# Patient Record
Sex: Female | Born: 1955
Health system: Southern US, Community
[De-identification: ages and names within clinical notes are randomized; demographics above are authoritative.]

## PROBLEM LIST (undated history)

## (undated) DIAGNOSIS — F329 Major depressive disorder, single episode, unspecified: Secondary | ICD-10-CM

## (undated) DIAGNOSIS — E669 Obesity, unspecified: Secondary | ICD-10-CM

## (undated) DIAGNOSIS — E785 Hyperlipidemia, unspecified: Secondary | ICD-10-CM

## (undated) DIAGNOSIS — R519 Headache, unspecified: Secondary | ICD-10-CM

## (undated) DIAGNOSIS — Z9889 Other specified postprocedural states: Secondary | ICD-10-CM

## (undated) DIAGNOSIS — I10 Essential (primary) hypertension: Secondary | ICD-10-CM

## (undated) DIAGNOSIS — T7840XA Allergy, unspecified, initial encounter: Secondary | ICD-10-CM

## (undated) DIAGNOSIS — F32A Depression, unspecified: Secondary | ICD-10-CM

## (undated) DIAGNOSIS — R112 Nausea with vomiting, unspecified: Secondary | ICD-10-CM

## (undated) DIAGNOSIS — F419 Anxiety disorder, unspecified: Secondary | ICD-10-CM

## (undated) HISTORY — PX: OTHER SURGICAL HISTORY: SHX169

## (undated) HISTORY — PX: ABDOMINAL HYSTERECTOMY: SHX81

## (undated) HISTORY — DX: Obesity, unspecified: E66.9

## (undated) HISTORY — DX: Allergy, unspecified, initial encounter: T78.40XA

## (undated) HISTORY — DX: Hyperlipidemia, unspecified: E78.5

## (undated) HISTORY — DX: Essential (primary) hypertension: I10

## (undated) HISTORY — DX: Anxiety disorder, unspecified: F41.9

## (undated) HISTORY — PX: CHOLECYSTECTOMY: SHX55

## (undated) HISTORY — DX: Major depressive disorder, single episode, unspecified: F32.9

## (undated) HISTORY — DX: Depression, unspecified: F32.A

---

## 1997-11-15 ENCOUNTER — Ambulatory Visit (HOSPITAL_COMMUNITY): Admission: RE | Admit: 1997-11-15 | Discharge: 1997-11-15 | Payer: Self-pay | Admitting: Family Medicine

## 1999-01-30 ENCOUNTER — Ambulatory Visit (HOSPITAL_COMMUNITY): Admission: RE | Admit: 1999-01-30 | Discharge: 1999-01-30 | Payer: Self-pay | Admitting: *Deleted

## 1999-01-30 ENCOUNTER — Encounter: Payer: Self-pay | Admitting: *Deleted

## 2000-06-16 ENCOUNTER — Encounter: Payer: Self-pay | Admitting: *Deleted

## 2000-06-16 ENCOUNTER — Ambulatory Visit (HOSPITAL_COMMUNITY): Admission: RE | Admit: 2000-06-16 | Discharge: 2000-06-16 | Payer: Self-pay | Admitting: *Deleted

## 2001-06-02 ENCOUNTER — Emergency Department (HOSPITAL_COMMUNITY): Admission: EM | Admit: 2001-06-02 | Discharge: 2001-06-02 | Payer: Self-pay | Admitting: *Deleted

## 2001-06-09 ENCOUNTER — Encounter: Payer: Self-pay | Admitting: Family Medicine

## 2001-06-09 ENCOUNTER — Encounter: Admission: RE | Admit: 2001-06-09 | Discharge: 2001-06-09 | Payer: Self-pay | Admitting: Family Medicine

## 2001-10-20 ENCOUNTER — Encounter: Payer: Self-pay | Admitting: Family Medicine

## 2001-10-20 ENCOUNTER — Ambulatory Visit (HOSPITAL_COMMUNITY): Admission: RE | Admit: 2001-10-20 | Discharge: 2001-10-20 | Payer: Self-pay | Admitting: Family Medicine

## 2001-11-29 ENCOUNTER — Encounter: Payer: Self-pay | Admitting: Emergency Medicine

## 2001-11-29 ENCOUNTER — Emergency Department (HOSPITAL_COMMUNITY): Admission: EM | Admit: 2001-11-29 | Discharge: 2001-11-29 | Payer: Self-pay | Admitting: *Deleted

## 2001-11-30 ENCOUNTER — Emergency Department (HOSPITAL_COMMUNITY): Admission: EM | Admit: 2001-11-30 | Discharge: 2001-11-30 | Payer: Self-pay | Admitting: *Deleted

## 2002-03-08 ENCOUNTER — Encounter: Payer: Self-pay | Admitting: *Deleted

## 2002-03-08 ENCOUNTER — Ambulatory Visit (HOSPITAL_COMMUNITY): Admission: RE | Admit: 2002-03-08 | Discharge: 2002-03-08 | Payer: Self-pay | Admitting: *Deleted

## 2002-07-06 ENCOUNTER — Encounter: Payer: Self-pay | Admitting: Emergency Medicine

## 2002-07-06 ENCOUNTER — Emergency Department (HOSPITAL_COMMUNITY): Admission: EM | Admit: 2002-07-06 | Discharge: 2002-07-06 | Payer: Self-pay | Admitting: Emergency Medicine

## 2002-11-14 ENCOUNTER — Other Ambulatory Visit: Admission: RE | Admit: 2002-11-14 | Discharge: 2002-11-14 | Payer: Self-pay | Admitting: Obstetrics and Gynecology

## 2003-05-23 ENCOUNTER — Encounter: Payer: Self-pay | Admitting: Obstetrics and Gynecology

## 2003-05-23 ENCOUNTER — Ambulatory Visit (HOSPITAL_COMMUNITY): Admission: RE | Admit: 2003-05-23 | Discharge: 2003-05-23 | Payer: Self-pay | Admitting: Obstetrics and Gynecology

## 2003-12-05 ENCOUNTER — Other Ambulatory Visit: Admission: RE | Admit: 2003-12-05 | Discharge: 2003-12-05 | Payer: Self-pay | Admitting: Obstetrics and Gynecology

## 2004-02-19 ENCOUNTER — Encounter: Admission: RE | Admit: 2004-02-19 | Discharge: 2004-02-19 | Payer: Self-pay | Admitting: Family Medicine

## 2004-06-11 ENCOUNTER — Ambulatory Visit (HOSPITAL_COMMUNITY): Admission: RE | Admit: 2004-06-11 | Discharge: 2004-06-11 | Payer: Self-pay | Admitting: Obstetrics and Gynecology

## 2004-10-02 ENCOUNTER — Ambulatory Visit: Payer: Self-pay | Admitting: Family Medicine

## 2004-12-10 ENCOUNTER — Other Ambulatory Visit: Admission: RE | Admit: 2004-12-10 | Discharge: 2004-12-10 | Payer: Self-pay | Admitting: Obstetrics and Gynecology

## 2004-12-30 ENCOUNTER — Emergency Department (HOSPITAL_COMMUNITY): Admission: EM | Admit: 2004-12-30 | Discharge: 2004-12-30 | Payer: Self-pay | Admitting: Emergency Medicine

## 2004-12-31 ENCOUNTER — Ambulatory Visit: Payer: Self-pay | Admitting: Family Medicine

## 2005-01-02 ENCOUNTER — Ambulatory Visit: Payer: Self-pay | Admitting: Licensed Clinical Social Worker

## 2005-01-08 ENCOUNTER — Ambulatory Visit: Payer: Self-pay | Admitting: Family Medicine

## 2005-01-15 ENCOUNTER — Ambulatory Visit: Payer: Self-pay | Admitting: Family Medicine

## 2005-04-28 ENCOUNTER — Ambulatory Visit: Payer: Self-pay | Admitting: Licensed Clinical Social Worker

## 2005-08-21 ENCOUNTER — Ambulatory Visit (HOSPITAL_COMMUNITY): Admission: RE | Admit: 2005-08-21 | Discharge: 2005-08-21 | Payer: Self-pay | Admitting: Obstetrics and Gynecology

## 2005-10-16 ENCOUNTER — Ambulatory Visit: Payer: Self-pay | Admitting: Family Medicine

## 2005-10-17 ENCOUNTER — Encounter: Admission: RE | Admit: 2005-10-17 | Discharge: 2005-10-17 | Payer: Self-pay | Admitting: Family Medicine

## 2005-10-17 ENCOUNTER — Ambulatory Visit: Payer: Self-pay | Admitting: Family Medicine

## 2005-10-22 ENCOUNTER — Ambulatory Visit (HOSPITAL_COMMUNITY): Admission: RE | Admit: 2005-10-22 | Discharge: 2005-10-22 | Payer: Self-pay | Admitting: Family Medicine

## 2005-11-07 ENCOUNTER — Ambulatory Visit: Payer: Self-pay | Admitting: Internal Medicine

## 2006-01-02 ENCOUNTER — Encounter (INDEPENDENT_AMBULATORY_CARE_PROVIDER_SITE_OTHER): Payer: Self-pay | Admitting: Specialist

## 2006-01-02 ENCOUNTER — Ambulatory Visit (HOSPITAL_COMMUNITY): Admission: RE | Admit: 2006-01-02 | Discharge: 2006-01-02 | Payer: Self-pay | Admitting: General Surgery

## 2006-01-13 ENCOUNTER — Ambulatory Visit: Payer: Self-pay | Admitting: Family Medicine

## 2006-01-19 ENCOUNTER — Ambulatory Visit: Payer: Self-pay | Admitting: Family Medicine

## 2006-07-01 ENCOUNTER — Ambulatory Visit: Payer: Self-pay | Admitting: Family Medicine

## 2006-09-02 ENCOUNTER — Ambulatory Visit (HOSPITAL_COMMUNITY): Admission: RE | Admit: 2006-09-02 | Discharge: 2006-09-02 | Payer: Self-pay | Admitting: Obstetrics and Gynecology

## 2006-09-24 ENCOUNTER — Ambulatory Visit: Payer: Self-pay | Admitting: Family Medicine

## 2007-01-06 ENCOUNTER — Ambulatory Visit: Payer: Self-pay | Admitting: Family Medicine

## 2007-01-06 LAB — CONVERTED CEMR LAB
Basophils Relative: 0.6 % (ref 0.0–1.0)
Bilirubin, Direct: 0.1 mg/dL (ref 0.0–0.3)
Calcium: 9.7 mg/dL (ref 8.4–10.5)
Chloride: 104 meq/L (ref 96–112)
Cholesterol: 182 mg/dL (ref 0–200)
Creatinine, Ser: 0.6 mg/dL (ref 0.4–1.2)
Direct LDL: 106.5 mg/dL
Eosinophils Relative: 3.8 % (ref 0.0–5.0)
GFR calc non Af Amer: 112 mL/min
Glucose, Bld: 91 mg/dL (ref 70–99)
HCT: 43.1 % (ref 36.0–46.0)
Lymphocytes Relative: 32.8 % (ref 12.0–46.0)
MCHC: 33.8 g/dL (ref 30.0–36.0)
MCV: 93.5 fL (ref 78.0–100.0)
Monocytes Absolute: 0.7 10*3/uL (ref 0.2–0.7)
Monocytes Relative: 7.7 % (ref 3.0–11.0)
Neutrophils Relative %: 55.1 % (ref 43.0–77.0)
Platelets: 267 10*3/uL (ref 150–400)
Sodium: 141 meq/L (ref 135–145)
TSH: 2.2 microintl units/mL (ref 0.35–5.50)
Total Bilirubin: 1 mg/dL (ref 0.3–1.2)
Total Protein: 7.2 g/dL (ref 6.0–8.3)
Triglycerides: 223 mg/dL (ref 0–149)
VLDL: 45 mg/dL — ABNORMAL HIGH (ref 0–40)
WBC: 8.8 10*3/uL (ref 4.5–10.5)

## 2007-01-08 DIAGNOSIS — F32A Depression, unspecified: Secondary | ICD-10-CM | POA: Insufficient documentation

## 2007-01-08 DIAGNOSIS — J309 Allergic rhinitis, unspecified: Secondary | ICD-10-CM | POA: Insufficient documentation

## 2007-01-08 DIAGNOSIS — F411 Generalized anxiety disorder: Secondary | ICD-10-CM | POA: Insufficient documentation

## 2007-01-08 DIAGNOSIS — F329 Major depressive disorder, single episode, unspecified: Secondary | ICD-10-CM | POA: Insufficient documentation

## 2007-01-08 DIAGNOSIS — E785 Hyperlipidemia, unspecified: Secondary | ICD-10-CM | POA: Insufficient documentation

## 2007-01-11 ENCOUNTER — Ambulatory Visit: Payer: Self-pay | Admitting: Family Medicine

## 2007-01-14 ENCOUNTER — Ambulatory Visit (HOSPITAL_COMMUNITY): Admission: RE | Admit: 2007-01-14 | Discharge: 2007-01-14 | Payer: Self-pay | Admitting: Obstetrics and Gynecology

## 2007-09-09 ENCOUNTER — Ambulatory Visit (HOSPITAL_COMMUNITY): Admission: RE | Admit: 2007-09-09 | Discharge: 2007-09-09 | Payer: Self-pay | Admitting: Obstetrics and Gynecology

## 2008-01-20 ENCOUNTER — Ambulatory Visit: Payer: Self-pay | Admitting: Family Medicine

## 2008-01-20 LAB — CONVERTED CEMR LAB
Alkaline Phosphatase: 70 units/L (ref 39–117)
Basophils Relative: 0.4 % (ref 0.0–1.0)
Bilirubin, Direct: 0.1 mg/dL (ref 0.0–0.3)
CO2: 32 meq/L (ref 19–32)
Chloride: 99 meq/L (ref 96–112)
Eosinophils Relative: 2.3 % (ref 0.0–5.0)
GFR calc Af Amer: 113 mL/min
GFR calc non Af Amer: 93 mL/min
Glucose, Bld: 91 mg/dL (ref 70–99)
Glucose, Urine, Semiquant: NEGATIVE
HCT: 41.2 % (ref 36.0–46.0)
Hemoglobin: 14.3 g/dL (ref 12.0–15.0)
Ketones, urine, test strip: NEGATIVE
LDL Cholesterol: 103 mg/dL — ABNORMAL HIGH (ref 0–99)
MCHC: 34.8 g/dL (ref 30.0–36.0)
MCV: 93.8 fL (ref 78.0–100.0)
Monocytes Relative: 8.2 % (ref 3.0–12.0)
Neutrophils Relative %: 51.4 % (ref 43.0–77.0)
RBC: 4.39 M/uL (ref 3.87–5.11)
RDW: 12.3 % (ref 11.5–14.6)
TSH: 1.46 microintl units/mL (ref 0.35–5.50)
Total CHOL/HDL Ratio: 6.3
Urobilinogen, UA: 0.2
VLDL: 40 mg/dL (ref 0–40)
WBC: 8.6 10*3/uL (ref 4.5–10.5)
pH: 6

## 2008-01-24 ENCOUNTER — Ambulatory Visit: Payer: Self-pay | Admitting: Family Medicine

## 2008-01-24 DIAGNOSIS — E669 Obesity, unspecified: Secondary | ICD-10-CM | POA: Insufficient documentation

## 2008-07-03 ENCOUNTER — Ambulatory Visit: Payer: Self-pay | Admitting: Family Medicine

## 2008-07-03 DIAGNOSIS — I10 Essential (primary) hypertension: Secondary | ICD-10-CM | POA: Insufficient documentation

## 2008-07-03 DIAGNOSIS — K625 Hemorrhage of anus and rectum: Secondary | ICD-10-CM | POA: Insufficient documentation

## 2008-08-01 DIAGNOSIS — T07XXXA Unspecified multiple injuries, initial encounter: Secondary | ICD-10-CM | POA: Insufficient documentation

## 2008-08-02 ENCOUNTER — Ambulatory Visit: Payer: Self-pay | Admitting: Family Medicine

## 2008-08-24 ENCOUNTER — Telehealth: Payer: Self-pay | Admitting: Family Medicine

## 2008-09-12 ENCOUNTER — Ambulatory Visit (HOSPITAL_COMMUNITY): Admission: RE | Admit: 2008-09-12 | Discharge: 2008-09-12 | Payer: Self-pay | Admitting: Obstetrics and Gynecology

## 2008-10-19 ENCOUNTER — Telehealth: Payer: Self-pay | Admitting: Family Medicine

## 2008-10-20 ENCOUNTER — Ambulatory Visit: Payer: Self-pay | Admitting: Internal Medicine

## 2008-10-20 DIAGNOSIS — R55 Syncope and collapse: Secondary | ICD-10-CM | POA: Insufficient documentation

## 2008-10-20 DIAGNOSIS — R197 Diarrhea, unspecified: Secondary | ICD-10-CM | POA: Insufficient documentation

## 2008-10-30 ENCOUNTER — Ambulatory Visit: Payer: Self-pay | Admitting: Family Medicine

## 2008-12-04 ENCOUNTER — Telehealth: Payer: Self-pay | Admitting: Family Medicine

## 2009-01-18 ENCOUNTER — Encounter: Payer: Self-pay | Admitting: Family Medicine

## 2009-02-06 ENCOUNTER — Ambulatory Visit: Payer: Self-pay | Admitting: Family Medicine

## 2009-05-22 DIAGNOSIS — H8110 Benign paroxysmal vertigo, unspecified ear: Secondary | ICD-10-CM | POA: Insufficient documentation

## 2009-05-23 ENCOUNTER — Telehealth: Payer: Self-pay | Admitting: Family Medicine

## 2009-05-25 ENCOUNTER — Ambulatory Visit: Payer: Self-pay | Admitting: Family Medicine

## 2009-07-19 ENCOUNTER — Ambulatory Visit: Payer: Self-pay | Admitting: Family Medicine

## 2009-10-23 ENCOUNTER — Ambulatory Visit (HOSPITAL_COMMUNITY): Admission: RE | Admit: 2009-10-23 | Discharge: 2009-10-23 | Payer: Self-pay | Admitting: Obstetrics and Gynecology

## 2009-11-06 ENCOUNTER — Ambulatory Visit: Payer: Self-pay | Admitting: Family Medicine

## 2009-11-08 ENCOUNTER — Telehealth: Payer: Self-pay | Admitting: Family Medicine

## 2010-02-14 ENCOUNTER — Ambulatory Visit: Payer: Self-pay | Admitting: Family Medicine

## 2010-07-24 ENCOUNTER — Emergency Department (HOSPITAL_COMMUNITY)
Admission: EM | Admit: 2010-07-24 | Discharge: 2010-07-24 | Payer: Self-pay | Source: Home / Self Care | Admitting: Emergency Medicine

## 2010-08-31 ENCOUNTER — Encounter: Payer: Self-pay | Admitting: Obstetrics and Gynecology

## 2010-09-12 NOTE — Assessment & Plan Note (Signed)
Summary: SORE THROAT // RS   Vital Signs:  Patient profile:   55 year old female Menstrual status:  hysterectomy Height:      67.25 inches Weight:      249 pounds BMI:     38.85 Temp:     98.3 degrees F oral BP sitting:   120 / 80  (left arm) Cuff size:   regular  Vitals Entered By: Kern Reap CMA Duncan Dull) (November 06, 2009 3:34 PM) CC: right ear pain, ST, drainage, headace Is Patient Diabetic? No   CC:  right ear pain, ST, drainage, and headace.  History of Present Illness: Rachel Gates is a 55 year old, married female, nonsmoker, who comes in today for evaluation of severe allergic rhinitis.  She is taking Claritin plain in the morning and a Zyrtec plane at bedtime, and she still symptomatic.  She is sneezing, runny nose had congestion and pain in both ears.  No asthma  Allergies: No Known Drug Allergies  Past History:  Past medical, surgical, family and social histories (including risk factors) reviewed for relevance to current acute and chronic problems.  Past Medical History: Reviewed history from 07/03/2008 and no changes required. MHA Allergic rhinitis Hyperlipidemia OBESE Anxiety Depression bladder suspension Hypertension  Past Surgical History: Reviewed history from 01/08/2007 and no changes required. Hysterectomy-FIBROIDS Cholecystectomy  Family History: Reviewed history from 01/24/2008 and no changes required. father died of an MI.  He was a smoker and had underlying glaucoma  mother had a history of hypertension, angina, and uterine cancer no brothers3 sisters in good health  Social History: Reviewed history from 10/20/2008 and no changes required. Occupation:works [employer]  Married Never Smoked Alcohol use-no Drug use-no Regular exercise-no   Review of Systems      See HPI  Physical Exam  General:  Well-developed,well-nourished,in no acute distress; alert,appropriate and cooperative throughout examination Head:  Normocephalic and atraumatic  without obvious abnormalities. No apparent alopecia or balding. Eyes:  No corneal or conjunctival inflammation noted. EOMI. Perrla. Funduscopic exam benign, without hemorrhages, exudates or papilledema. Vision grossly normal. Ears:  External ear exam shows no significant lesions or deformities.  Otoscopic examination reveals clear canals, tympanic membranes are intact bilaterally without bulging, retraction, inflammation or discharge. Hearing is grossly normal bilaterally. Nose:  septum in the midline with 4+ nasal edema Mouth:  Oral mucosa and oropharynx without lesions or exudates.  Teeth in good repair. Neck:  No deformities, masses, or tenderness noted. Chest Wall:  No deformities, masses, or tenderness noted.   Impression & Recommendations:  Problem # 1:  ALLERGIC RHINITIS (ICD-477.9) Assessment Deteriorated  Her updated medication list for this problem includes:    Zyrtec Allergy 10 Mg Tabs (Cetirizine hcl) .Marland Kitchen... As needed allergies  Complete Medication List: 1)  Tenoretic 50 50-25 Mg Tabs (Atenolol-chlorthalidone) .... Take half tab once daily 2)  Hydrochlorothiazide 25 Mg Tabs (Hydrochlorothiazide) .... Take one tab as needed 3)  Corgard 20 Mg Tabs (Nadolol) .... Once daily 4)  Simvastatin 20 Mg Tabs (Simvastatin) .... Once daily 5)  Zyrtec Allergy 10 Mg Tabs (Cetirizine hcl) .... As needed allergies 6)  Celexa 20 Mg Tabs (Citalopram hydrobromide) .... Once daily 7)  Adult Aspirin Ec Low Strength 81 Mg Tbec (Aspirin) .... Once daily 8)  Calcium 600 600 Mg Tabs (Calcium carbonate) .... Two times a day 9)  Multi-vitamin  .... Once daily 10)  Fish Oil Oil (Fish oil) .... Once daily 11)  Flexeril 10 Mg Tabs (Cyclobenzaprine hcl) .... Take 1 tablet by mouth  three times a day 12)  Vicodin Es 7.5-750 Mg Tabs (Hydrocodone-acetaminophen) .... Take 1 tablet by mouth three times a day 13)  Ibu 800 Mg Tabs (Ibuprofen) .Marland Kitchen.. 1 tab by mouth three times a day as needed pain 14)  Transderm-scop  1.5 Mg Pt72 (Scopolamine base) .... Apply q 3 days for vertigo 15)  Prednisone 20 Mg Tabs (Prednisone) .... Uad  Patient Instructions: 1)  begin prednisone, take two now then starting tomorrow morning take two tabs every morning x 3 days, one x 3 days, a half x 3 days, then half a tablet Monday, Wednesday, Friday, for a two week taper.  Why you're taking the prednisone.u  can stop the Claritin and Zyrtec Prescriptions: PREDNISONE 20 MG TABS (PREDNISONE) UAD  #30 x 1   Entered and Authorized by:   Roderick Pee MD   Signed by:   Roderick Pee MD on 11/06/2009   Method used:   Electronically to        [facility]  [phone]* (retail)       [address]       [place], [place]  [address]       Ph: [phone] or [phone]       Fax: [phone]   RxID:   [id number] PREDNISONE 20 MG TABS (PREDNISONE) UAD  #30 x 1   Entered and Authorized by:   Roderick Pee MD   Signed by:   Roderick Pee MD on 11/06/2009   Method used:   Electronically to        [facility]  [phone]* (retail)       [address]       [place], [place]  [address]       Ph: [phone] or [phone]       Fax: [phone]   RxID:   [id number]

## 2010-09-12 NOTE — Progress Notes (Signed)
Summary: Prednisone?  Phone Note Call from Patient Call back at [facility] Phone [phone]   Summary of Call: Reaction to prednisone she wonders.  Episodes of headache, ear pressure, dizzy, pains back of neck, nausea, migraine, hot, Left side numbness  with BP 151/90.  The numbness is of great concern to her.  2 doses of the Prednisone can now hear & doesn't have the pressure. Initial call taken by: Rudy Jew, RN,  November 08, 2009 9:13 AM  Follow-up for Phone Call        these are not typical side effects from prednisone.  However, decrease the dose to one tablet daily, x 3 days, a half a tablet daily, x 3 days, then a half a tablet Monday, Wednesday, Friday, for a two week taper Follow-up by: Roderick Pee MD,  November 08, 2009 11:06 AM  Additional Follow-up for Phone Call Additional follow up Details #1::        Phone Call Completed Additional Follow-up by: Rudy Jew, RN,  November 08, 2009 12:15 PM    New/Updated Medications: PREDNISONE 20 MG TABS (PREDNISONE) UAD

## 2010-09-12 NOTE — Assessment & Plan Note (Signed)
Summary: cpx/no pap/njr/pt rescd from bump//ccm   Vital Signs:  Patient profile:   55 year old female Menstrual status:  hysterectomy Height:      67 inches Weight:      248 pounds BMI:     38.98 Temp:     97.8 degrees F oral BP sitting:   110 / 80  (left arm) Cuff size:   regular  Vitals Entered By: Kathrynn Speed CMA (February 14, 2010 4:10 PM) CC: cpx w labs Cyndia Skeeters   CC:  cpx w labs /src.  History of Present Illness: Rachel Gates is a 55 year old, married female, nonsmoker, who comes in today for general physical examination because of underlying hyperlipidemia, hypertension, allergic rhinitis, and mild depression.  Her hypertension is treated with Tenoretic 50 -- 25 daily and Corgard 20 mg daily, HCTZ, 25 mg daily, BP 110/80.  Potassium normal at 3.7.  She takes simvastatin 20 nightly lipids are ago, except she has a high triglycerides and low HDL.  She needs to diet and exercise.  She takes Zyrtec or TCU for allergic rhinitis.  She takes Celexa 20 mg nightly for mild depression.  She sees to GYN for Pap.  Review of systems otherwise negative.  She gets routine eye care.  Dental care.  Colonoscopy normal.  Tetanus 2005 seasonal flu 2010 to  Current Medications (verified): 1)  Tenoretic 50 50-25 Mg  Tabs (Atenolol-Chlorthalidone) .... Take Half Tab Once Daily 2)  Hydrochlorothiazide 25 Mg  Tabs (Hydrochlorothiazide) .... Take One Tab As Needed 3)  Corgard 20 Mg  Tabs (Nadolol) .... Once Daily 4)  Simvastatin 20 Mg  Tabs (Simvastatin) .... Once Daily 5)  Zyrtec Allergy 10 Mg  Tabs (Cetirizine Hcl) .... As Needed Allergies 6)  Celexa 20 Mg  Tabs (Citalopram Hydrobromide) .... Once Daily 7)  Adult Aspirin Ec Low Strength 81 Mg  Tbec (Aspirin) .... Once Daily 8)  Calcium 600 600 Mg  Tabs (Calcium Carbonate) .... Two Times A Day 9)  Multi-Vitamin .... Once Daily 10)  Fish Oil   Oil (Fish Oil) .... Once Daily 11)  Vicodin Es 7.5-750 Mg Tabs (Hydrocodone-Acetaminophen) .... Take 1  Tablet By Mouth Three Times A Day 12)  Ibu 800 Mg Tabs (Ibuprofen) .Marland Kitchen.. 1 Tab By Mouth Three Times A Day As Needed Pain  Allergies (verified): No Known Drug Allergies  Past History:  Past medical, surgical, family and social histories (including risk factors) reviewed, and no changes noted (except as noted below).  Past Medical History: Reviewed history from 07/03/2008 and no changes required. MHA Allergic rhinitis Hyperlipidemia OBESE Anxiety Depression bladder suspension Hypertension  Past Surgical History: Reviewed history from 01/08/2007 and no changes required. Hysterectomy-FIBROIDS Cholecystectomy  Family History: Reviewed history from 01/24/2008 and no changes required. father died of an MI.  He was a smoker and had underlying glaucoma  mother had a history of hypertension, angina, and uterine cancer no brothers3 sisters in good health  Social History: Reviewed history from 10/20/2008 and no changes required. Occupation:works [employer]  Married Never Smoked Alcohol use-no Drug use-no Regular exercise-no   Review of Systems      See HPI  Physical Exam  General:  Well-developed,well-nourished,in no acute distress; alert,appropriate and cooperative throughout examination Head:  Normocephalic and atraumatic without obvious abnormalities. No apparent alopecia or balding. Eyes:  No corneal or conjunctival inflammation noted. EOMI. Perrla. Funduscopic exam benign, without hemorrhages, exudates or papilledema. Vision grossly normal. Ears:  External ear exam shows no significant lesions or deformities.  Otoscopic examination reveals clear canals, tympanic membranes are intact bilaterally without bulging, retraction, inflammation or discharge. Hearing is grossly normal bilaterally. Nose:  External nasal examination shows no deformity or inflammation. Nasal mucosa are pink and moist without lesions or exudates. Mouth:  Oral mucosa and oropharynx without lesions or  exudates.  Teeth in good repair. Neck:  No deformities, masses, or tenderness noted. Chest Wall:  No deformities, masses, or tenderness noted. Breasts:  No mass, nodules, thickening, tenderness, bulging, retraction, inflamation, nipple discharge or skin changes noted.   Lungs:  Normal respiratory effort, chest expands symmetrically. Lungs are clear to auscultation, no crackles or wheezes. Heart:  Normal rate and regular rhythm. S1 and S2 normal without gallop, murmur, click, rub or other extra sounds. Abdomen:  Bowel sounds positive,abdomen soft and non-tender without masses, organomegaly or hernias noted. Msk:  No deformity or scoliosis noted of thoracic or lumbar spine.   Pulses:  R and L carotid,radial,femoral,dorsalis pedis and posterior tibial pulses are full and equal bilaterally Extremities:  No clubbing, cyanosis, edema, or deformity noted with normal full range of motion of all joints.   Neurologic:  No cranial nerve deficits noted. Station and gait are normal. Plantar reflexes are down-going bilaterally. DTRs are symmetrical throughout. Sensory, motor and coordinative functions appear intact. Skin:  Intact without suspicious lesions or rashes Cervical Nodes:  No lymphadenopathy noted Axillary Nodes:  No palpable lymphadenopathy Inguinal Nodes:  No significant adenopathy Psych:  Cognition and judgment appear intact. Alert and cooperative with normal attention span and concentration. No apparent delusions, illusions, hallucinations   Impression & Recommendations:  Problem # 1:  HYPERTENSION (ICD-401.9) Assessment Improved  Her updated medication list for this problem includes:    Tenoretic 50 50-25 Mg Tabs (Atenolol-chlorthalidone) .Marland Kitchen... Take half tab once daily    Hydrochlorothiazide 25 Mg Tabs (Hydrochlorothiazide) .Marland Kitchen... Take one tab as needed    Corgard 20 Mg Tabs (Nadolol) ..... Once daily  Her updated medication list for this problem includes:    Tenoretic 50 50-25 Mg Tabs  (Atenolol-chlorthalidone) .Marland Kitchen... Take half tab once daily    Hydrochlorothiazide 25 Mg Tabs (Hydrochlorothiazide) .Marland Kitchen... Take one tab as needed    Corgard 20 Mg Tabs (Nadolol) ..... Once daily  Orders: Prescription Created Electronically [phone]) EKG w/ Interpretation (93000)  Problem # 2:  OVERWEIGHT (ICD-278.02) Assessment: Unchanged  Orders: Prescription Created Electronically [phone]) EKG w/ Interpretation (93000)  Problem # 3:  PHYSICAL EXAMINATION (ICD-V70.0) Assessment: Unchanged  Orders: Prescription Created Electronically [phone]) EKG w/ Interpretation ([address]000)  Problem # 4:  DEPRESSION (ICD-311) Assessment: Improved  Her updated medication list for this problem includes:    Celexa 20 Mg Tabs (Citalopram hydrobromide) ..... Once daily  Orders: Prescription Created Electronically [phone])  Problem # 5:  HYPERLIPIDEMIA (ICD-272.4) Assessment: Improved  Her updated medication list for this problem includes:    Simvastatin 20 Mg Tabs (Simvastatin) ..... Once daily  Problem # 6:  ALLERGIC RHINITIS (ICD-477.9) Assessment: Improved  Her updated medication list for this problem includes:    Zyrtec Allergy 10 Mg Tabs (Cetirizine hcl) .Marland Kitchen... As needed allergies  Complete Medication List: 1)  Tenoretic 50 50-25 Mg Tabs (Atenolol-chlorthalidone) .... Take half tab once daily 2)  Hydrochlorothiazide 25 Mg Tabs (Hydrochlorothiazide) .... Take one tab as needed 3)  Corgard 20 Mg Tabs (Nadolol) .... Once daily 4)  Simvastatin 20 Mg Tabs (Simvastatin) .... Once daily 5)  Zyrtec Allergy 10 Mg Tabs (Cetirizine hcl) .... As needed allergies 6)  Celexa 20 Mg Tabs (Citalopram hydrobromide) .Marland KitchenMarland KitchenMarland Kitchen  Once daily 7)  Adult Aspirin Ec Low Strength 81 Mg Tbec (Aspirin) .... Once daily 8)  Calcium 600 600 Mg Tabs (Calcium carbonate) .... Two times a day 9)  Multi-vitamin  .... Once daily 10)  Fish Oil Oil (Fish oil) .... Once daily 11)  Vicodin Es 7.5-750 Mg Tabs (Hydrocodone-acetaminophen)  .... Take 1 tablet by mouth three times a day 12)  Ibu 800 Mg Tabs (Ibuprofen) .Marland Kitchen.. 1 tab by mouth three times a day as needed pain  Patient Instructions: 1)  Please schedule a follow-up appointment in 1 year. 2)  It is important that you exercise regularly at least 20 minutes 5 times a week. If you develop chest pain, have severe difficulty breathing, or feel very tired , stop exercising immediately and seek medical attention. 3)  You need to lose weight. Consider a lower calorie diet and regular exercise.  4)  Schedule your mammogram. 5)  Schedule a colonoscopy/sigmoidoscopy to help detect colon cancer. 6)  Take calcium +Vitamin D daily. 7)  Take an Aspirin every day. Prescriptions: CELEXA 20 MG  TABS (CITALOPRAM HYDROBROMIDE) once daily  #100 x 3   Entered and Authorized by:   Roderick Pee MD   Signed by:   Roderick Pee MD on 02/14/2010   Method used:   Electronically to        [facility]* (retail)       [address] [place], [place]  [address]       Ph: [phone]       Fax: [phone]   RxID:   [id number] SIMVASTATIN 20 MG  TABS (SIMVASTATIN) once daily  #100 x 3   Entered and Authorized by:   Roderick Pee MD   Signed by:   Roderick Pee MD on 02/14/2010   Method used:   Electronically to        [facility]* (retail)       [address] [place], [place]  [address]       Ph: [phone]       Fax: [phone]   RxID:   [id number] CORGARD 20 MG  TABS (NADOLOL) once daily  #100 x 3   Entered and Authorized by:   Roderick Pee MD   Signed by:   Roderick Pee MD on 02/14/2010   Method used:   Electronically to        [facility]* (retail)       [address] [place], [place]  [address]       Ph: [phone]       Fax: [phone]   RxID:   [id number] HYDROCHLOROTHIAZIDE 25 MG  TABS (HYDROCHLOROTHIAZIDE) take one tab as needed  #100 x 3    Entered and Authorized by:   Roderick Pee MD   Signed by:   Roderick Pee MD on 02/14/2010   Method used:   Electronically to        [facility]* (retail)       [address] [place], [place]  [address]       Ph: [phone]       Fax:  [phone]   RxID:   [id number] TENORETIC 50 50-25 MG  TABS (ATENOLOL-CHLORTHALIDONE) take half tab once daily  #50 x 4   Entered and Authorized by:   Roderick Pee MD   Signed by:   Roderick Pee MD on 02/14/2010   Method used:   Electronically to        [facility]* (retail)       [address] [place], [place]  [address]       Ph: [phone]       Fax: [phone]   RxID:   [id number]

## 2010-10-17 ENCOUNTER — Other Ambulatory Visit: Payer: Self-pay | Admitting: Family Medicine

## 2010-10-17 DIAGNOSIS — Z1231 Encounter for screening mammogram for malignant neoplasm of breast: Secondary | ICD-10-CM

## 2010-10-22 ENCOUNTER — Other Ambulatory Visit: Payer: Self-pay | Admitting: Family Medicine

## 2010-10-22 MED ORDER — ATENOLOL 50 MG PO TABS: 50.0000 mg | ORAL_TABLET | ORAL | Status: DC

## 2010-10-22 MED ORDER — SIMVASTATIN 20 MG PO TABS: 20.0000 mg | ORAL_TABLET | Freq: Every day | ORAL | Status: DC

## 2010-10-22 MED ORDER — NADOLOL 20 MG PO TABS: 20.0000 mg | ORAL_TABLET | Freq: Every day | ORAL | Status: DC

## 2010-10-29 ENCOUNTER — Ambulatory Visit (HOSPITAL_COMMUNITY)
Admission: RE | Admit: 2010-10-29 | Discharge: 2010-10-29 | Disposition: A | Discharge status: Home / Self Care | Payer: BC Managed Care – PPO | Source: Ambulatory Visit | Attending: Family Medicine | Admitting: Family Medicine

## 2010-10-29 DIAGNOSIS — Z1231 Encounter for screening mammogram for malignant neoplasm of breast: Secondary | ICD-10-CM | POA: Insufficient documentation

## 2010-12-24 NOTE — H&P (Signed)
NAME:  Rachel Gates, Rachel Gates               ACCOUNT NO.:  [id number]   MEDICAL RECORD NO.:  [id number]          PATIENT TYPE:  AMB   LOCATION:  SDC                           FACILITY:  [facility]   PHYSICIAN:  Guy Sandifer. Henderson Cloud, M.D. DATE OF BIRTH:  05-11-1956   DATE OF ADMISSION:  01/14/2007  DATE OF DISCHARGE:                              HISTORY & PHYSICAL   CHIEF COMPLAINT:  Leaking urine.   HISTORY OF PRESENT ILLNESS:  This patient is a 55 year old married white  female, G2, P2, status post total abdominal hysterectomy in 1998, who  has increasingly severe and bothersome leaking of urine with coughing,  sneezing, etc.  Urodynamic evaluation confirms stress urinary  incontinence.  After discussion of the options, she is being admitted  for transobturator mid urethral sling.  The potential risks and  complications had been reviewed preoperatively.   PAST MEDICAL HISTORY:  1. Chronic hypertension.  2. Hyperlipidemia.  3. Urinary urgency.  4. Migraine headaches.   PAST SURGICAL HISTORY:  1. Total abdominal hysterectomy.  2. Laparoscopic cholecystectomy.   OBSTETRIC HISTORY:  Vaginal delivery x2.   FAMILY HISTORY:  Cancer in mother.  Heart disease in mother and father.   MEDICATIONS:  1. Ditropan 5 mg daily.  2. Tenoretic daily.  3. Celexa 10 mg daily.  4. Zocor daily.   ALLERGIES:  No known drug allergies.   SOCIAL HISTORY:  Denies tobacco, alcohol or drug abuse.   REVIEW OF SYSTEMS:  NEUROLOGIC:  History of migraines as above.  CARDIAC:  Denies chest pain.  PULMONARY:  Denies shortness of breath.  GI:  Denies recent changes in bowel habits.   PHYSICAL EXAMINATION:  VITAL SIGNS:  Height 5 feet, 8 inches.  Weight  231 pounds.  Blood pressure 114/78.  HEENT:  Without thyromegaly.  LUNGS:  Clear to auscultation.  HEART:  Regular rate and rhythm.  BACK:  Without CVA tenderness.  BREASTS:  Without mass, retraction, discharge.  ABDOMEN:  Soft, nontender without masses.  PELVIC:   Vulva and vagina without lesion.  Adnexa nontender without  masses.  EXTREMITIES:  Grossly within normal limits.  NEUROLOGIC:  Grossly within normal limits.   ASSESSMENT:  Stress urinary incontinence.   PLAN:  Transobturator mid urethral sling.      Guy Sandifer Henderson Cloud, M.D.  Electronically Signed     JET/MEDQ  D:  01/06/2007  T:  01/06/2007  Job:  [id number]

## 2010-12-27 NOTE — Op Note (Signed)
NAME:  Rachel Gates, Rachel Gates               ACCOUNT NO.:  [id number]   MEDICAL RECORD NO.:  [id number]          PATIENT TYPE:  AMB   LOCATION:  SDC                           FACILITY:  [facility]   PHYSICIAN:  Guy Sandifer. Henderson Cloud, M.D. DATE OF BIRTH:  04-Feb-1956   DATE OF PROCEDURE:  01/14/2007  DATE OF DISCHARGE:                               OPERATIVE REPORT   PREOPERATIVE DIAGNOSIS:  Stress urinary continence   POSTOPERATIVE DIAGNOSIS:  Stress urinary continence.   PROCEDURE:  1. Transobturator tape.  2. Cystoscopy.   SURGEON:  Guy Sandifer. Henderson Cloud, M.D.   ANESTHESIA:  General with LMA.   ESTIMATED BLOOD LOSS:  Drops.   INDICATION AND CONSENT:  The patient is a 55 year old married white  female G2, P2, status post abdominal hysterectomy with increasingly  symptomatic stress urinary continence.  Details are dictated history and  physical.  Transobturator mid urethral sling is discussed  preoperatively.  Potential risks and complications discussed  preoperatively including but limited to infection, organ damage,  bleeding requiring transfusion of blood products with possible  transfusion reaction, HIV and hepatitis acquisition, DVT, PE, pneumonia,  urinary retention, recurrent or persistent stress urinary continence  need for prolonged or intermittent self-catheterization, dyspareunia,  erosion, possible return to the operating room.  All questions were  answered and consent is signed on the chart.   PROCEDURE:  The patient taken to operating room where she is identified,  placed in dorsosupine position and general anesthesia was induced via  LMA.  She is then placed in dorsal lithotomy position where she is  prepped and draped in sterile fashion.  Weighted speculum was placed.  The suburethral vaginal mucosa is grasped with at the 3 and 9 o'clock  positions with Allis clamps.  The mucosa under the course of the urethra  is then injected with 1/2% lidocaine with 1:200,000 epinephrine.  After  identifying the incision sites for the transobturator needle passage to  the skin is then injected there as well.  At the beginning of the case  Foley catheter had been placed and it drained the bladder and was left  in place.  The suburethral vaginal mucosa is then incised in the midline  and dissection is carried out bilaterally.  Stab incision is made over  the obturator foramen bilaterally.  Then using the halo needle with the  vaginal finger to guide the passage, the needle was passed around the  pelvic bone through the margin of the obturator foramen and the needle  is controlled and passed through the suburethral incision.  Similar  procedure is carried out on the left side as well.  Foley catheter is  removed and cystoscopy is carried out with 70 degrees scope.  There is  no evidence of foreign bodies or perforation of the bladder or the  urethra.  Cystoscope was withdrawn.  Foley catheter is replaced and left  in place.  The sling is then placed on the needles and the needles were  then withdrawn back through the incisions bilaterally.  The sheath is  removed.  Proper tensioning is noted when the [facility] clamp  could be  placed below the sling and rotated perpendicular to floor with no  tension.  The sheath is then  removed, sling is trimmed at the level of the skin bilaterally.  Vaginal  mucosa is closed with a running locking 2-0 Vicryl suture.  Dermabond is  applied to the skin incisions.  All counts correct.  The patient is  awakened, taken to recovery room in stable condition.      Guy Sandifer Henderson Cloud, M.D.  Electronically Signed     JET/MEDQ  D:  01/14/2007  T:  01/14/2007  Job:  [id number]

## 2010-12-27 NOTE — Op Note (Signed)
NAME:  Rachel Gates, Rachel Gates               ACCOUNT NO.:  [id number]   MEDICAL RECORD NO.:  [id number]          PATIENT TYPE:  AMB   LOCATION:  DAY                          FACILITY:  [facility]   PHYSICIAN:  Timothy E. Earlene Plater, M.D. DATE OF BIRTH:  March 08, 1956   DATE OF PROCEDURE:  01/02/2006  DATE OF DISCHARGE:                                 OPERATIVE REPORT   PREOPERATIVE DIAGNOSIS:  Biliary dyskinesia.   POSTOPERATIVE DIAGNOSIS:  Biliary dyskinesia.   PROCEDURE:  Laparoscopic cholecystectomy and intraoperative cholangiogram.   SURGEON:  Timothy E. Earlene Plater, M.D.   ASSISTANT:  Gita Kudo, M.D.   ANESTHESIA:  General.   Ms. Rachel Gates is 47.  She does have other medical conditions, including  obesity, hypertension, and high cholesterol.  She has for the past several  months and years gone through several episodes of testing for rather  constant right upper quadrant pain with radiation to the right back.  She  has seen her primary care, her gastroenterologist twice.  She has had upper  and lower endoscopy.  She has had ultrasounds which are not positive for  stones.  She has had an ejection fraction that went from 80% to 38%, which  is borderline.  Because of her strong family history, her constant symptoms,  and borderline testing, she is very aggressively pursuing removal of her  gallbladder.  We counseled about this on two occasions with her after she  saw her gastroenterologist.  She still would like to proceed, and I have  agreed to do so with the clear understanding that there is a significant  chance that this surgery will not alleviate her symptoms.  She voices  understanding.   She is seen, identified, and the permit signed today.   She is taken to the operating room, placed supine.  General endotracheal  anesthesia administered.  The abdomen was prepped and draped in the usual  fashion.  Marcaine 0.25% was used prior to each incision.  An infraumbilical  incision made, the fascia  identified and opened in the midline, and the  peritoneum entered without complications.  The Hasson catheter placed and  tied in place with #1 Vicryl.  The abdomen insufflated.  General  peritoneoscopy carried out and was unremarkable.  A second 10 mm trocar  placed in the mid epigastrium.  Two 5 mm trocars in the right upper  quadrant.  The gallbladder appeared indolent, perhaps somewhat thickened.  No adhesions.  No grossly visible or palpable stones.  The gallbladder was  grasped, placed on tension.  The abundant fatty tissue around its base was  gently stripped away.  A small anterior artery was identified, triply  clipped and divided.  The cystic duct was then easily dissected out.  A clip  was placed in the gallbladder.  The duct was opened, and a cholangiogram  catheter placed percutaneous and into the cystic duct.  A real time  cholangiogram made using instrument dye, and fluoroscopy showed a normal  biliary tree with rapid emptying of dye into the duodenum.  No abnormalities  noted.  The clip catheter removed.  The stump of the cystic duct triply  clipped and fully divided.  We then carefully dissected up the base of the  gallbladder in the gallbladder bed.  Several clips were placed, four  vessels.  There were no complications.  The bed of the gallbladder was  dried.  The gallbladder was placed in an EndoCatch bag, and copious  irrigation carried out.  The gallbladder was then removed from the abdomen  through the infraumbilical incision, which was sutured closed.  Direct  vision of the abdomen revealed no complications.  All irrigants, CO2,  instruments, and trocars were removed under direct vision.  Skin incision  was checked.  Sponge count correct.  All incisions closed with 4-0 Monocryl.  Steri-Strips applied.  Patient tolerated it well and was removed to the  recovery room in good condition.  She will be seen and followed as an  outpatient.      Timothy E. Earlene Plater, M.D.   Electronically Signed     TED/MEDQ  D:  01/02/2006  T:  01/02/2006  Job:  [id number]   cc:   Tinnie Gens A. Tawanna Cooler, M.D. [facility]  [address] [place]  [place] [address]   Danise Edge, M.D.  Fax: [phone]

## 2011-02-06 ENCOUNTER — Other Ambulatory Visit: Payer: BC Managed Care – PPO

## 2011-02-14 ENCOUNTER — Other Ambulatory Visit: Payer: BC Managed Care – PPO

## 2011-02-20 ENCOUNTER — Encounter: Payer: Self-pay | Admitting: Family Medicine

## 2011-02-20 ENCOUNTER — Ambulatory Visit (INDEPENDENT_AMBULATORY_CARE_PROVIDER_SITE_OTHER): Payer: BC Managed Care – PPO | Admitting: Family Medicine

## 2011-02-20 DIAGNOSIS — F329 Major depressive disorder, single episode, unspecified: Secondary | ICD-10-CM

## 2011-02-20 DIAGNOSIS — F3289 Other specified depressive episodes: Secondary | ICD-10-CM

## 2011-02-20 DIAGNOSIS — E663 Overweight: Secondary | ICD-10-CM

## 2011-02-20 DIAGNOSIS — E785 Hyperlipidemia, unspecified: Secondary | ICD-10-CM

## 2011-02-20 DIAGNOSIS — J309 Allergic rhinitis, unspecified: Secondary | ICD-10-CM

## 2011-02-20 DIAGNOSIS — I10 Essential (primary) hypertension: Secondary | ICD-10-CM

## 2011-02-20 DIAGNOSIS — Z0000 Encounter for general adult medical examination without abnormal findings: Secondary | ICD-10-CM

## 2011-02-20 MED ORDER — HYDROCHLOROTHIAZIDE 25 MG PO TABS: 25.0000 mg | ORAL_TABLET | Freq: Every day | ORAL | Status: DC

## 2011-02-20 MED ORDER — CITALOPRAM HYDROBROMIDE 20 MG PO TABS: 20.0000 mg | ORAL_TABLET | Freq: Every day | ORAL | Status: DC

## 2011-02-20 MED ORDER — NADOLOL 20 MG PO TABS: 20.0000 mg | ORAL_TABLET | Freq: Every day | ORAL | Status: DC

## 2011-02-20 MED ORDER — SIMVASTATIN 20 MG PO TABS: 20.0000 mg | ORAL_TABLET | Freq: Every day | ORAL | Status: DC

## 2011-02-20 MED ORDER — ATENOLOL 50 MG PO TABS: 50.0000 mg | ORAL_TABLET | ORAL | Status: DC

## 2011-02-20 NOTE — Patient Instructions (Signed)
Continued y   Current medications.  Begin a diet and exercise program.  Return in one year, sooner if any problem.  Return sometime in August for removal of the skin tags

## 2011-02-20 NOTE — Progress Notes (Signed)
  Subjective:    Patient ID: Rachel Gates, female    DOB: 11-17-1955, 55 y.o.   MRN: [id number]  HPI Rachel Gates is a 55 year old, married female, nonsmoker, who comes in today for a general physical examination because of a history of allergic rhinitis, mild depression, hypertension, hyperlipidemia, obesity,  Para medications reviewed to been no changes.  BP 120/74.  She is routine eye care, dental care, BSE monthly, and a mammography, colonoscopy, normal, tetanus, 2005.  Review of systems negative except for multiple skin tags, which she would like removed   Review of Systems  Constitutional: Negative.   HENT: Negative.   Eyes: Negative.   Respiratory: Negative.   Cardiovascular: Negative.   Gastrointestinal: Negative.   Genitourinary: Negative.   Musculoskeletal: Negative.   Neurological: Negative.   Hematological: Negative.   Psychiatric/Behavioral: Negative.        Objective:   Physical Exam  Constitutional: She appears well-developed and well-nourished.  HENT:  Head: Normocephalic and atraumatic.  Right Ear: External ear normal.  Left Ear: External ear normal.  Nose: Nose normal.  Mouth/Throat: Oropharynx is clear and moist.  Eyes: EOM are normal. Pupils are equal, round, and reactive to light.  Neck: Normal range of motion. Neck supple. No thyromegaly present.  Cardiovascular: Normal rate, regular rhythm, normal heart sounds and intact distal pulses.  Exam reveals no gallop and no friction rub.   No murmur heard. Pulmonary/Chest: Effort normal and breath sounds normal.  Abdominal: Soft. Bowel sounds are normal. She exhibits no distension and no mass. There is no tenderness. There is no rebound.  Genitourinary:       Bilateral breast exam normal  Musculoskeletal: Normal range of motion.  Lymphadenopathy:    She has no cervical adenopathy.  Neurological: She is alert. She has normal reflexes. No cranial nerve deficit. She exhibits normal muscle tone. Coordination  normal.  Skin: Skin is warm and dry.       Numerous skin tags  Psychiatric: She has a normal mood and affect. Her behavior is normal. Judgment and thought content normal.          Assessment & Plan:  Healthy female.  Obesity.  Weight 258,,,,,,,, recommend exercise and weight loss.  Allergic rhinitis.  Continue Zyrtec 10 mg daily.  Mild depression.  Continue Celexa 20 nightly  Hypertension.  Continue hydrochlorothiazide 25 mg daily, Tenormin, 50 mg daily, Corgard 20 mg daily.  Hyperlipidemia.  Continue Zocor 20 nightly along with an aspirin tablet.  Postmenopausal.  Continue calcium vitamin D

## 2011-08-15 ENCOUNTER — Telehealth: Payer: Self-pay | Admitting: *Deleted

## 2011-08-15 NOTE — Telephone Encounter (Signed)
Pt has sinus infection.  She can not come in cause she has a family member in ICU.  She was wondering if Dr Tawanna Cooler would call her in something.  Please advise

## 2011-08-15 NOTE — Telephone Encounter (Signed)
Spoke with patient and she will try OTC. 

## 2011-09-05 ENCOUNTER — Ambulatory Visit (INDEPENDENT_AMBULATORY_CARE_PROVIDER_SITE_OTHER): Payer: BC Managed Care – PPO | Admitting: Family

## 2011-09-05 ENCOUNTER — Encounter: Payer: Self-pay | Admitting: Family

## 2011-09-05 VITALS — BP 124/86 | HR 80 | Temp 97.9°F | Resp 16 | Wt 246.0 lb

## 2011-09-05 DIAGNOSIS — Z8669 Personal history of other diseases of the nervous system and sense organs: Secondary | ICD-10-CM

## 2011-09-05 DIAGNOSIS — J069 Acute upper respiratory infection, unspecified: Secondary | ICD-10-CM

## 2011-09-05 DIAGNOSIS — R05 Cough: Secondary | ICD-10-CM

## 2011-09-05 DIAGNOSIS — R059 Cough, unspecified: Secondary | ICD-10-CM

## 2011-09-05 MED ORDER — GUAIFENESIN-CODEINE 100-10 MG/5ML PO SYRP: 5.0000 mL | ORAL_SOLUTION | Freq: Three times a day (TID) | ORAL | Status: DC | PRN

## 2011-09-05 MED ORDER — AMOXICILLIN 500 MG PO TABS: 1000.0000 mg | ORAL_TABLET | Freq: Two times a day (BID) | ORAL | Status: DC

## 2011-09-05 NOTE — Patient Instructions (Signed)

## 2011-09-05 NOTE — Progress Notes (Signed)
  Subjective:    Patient ID: Rachel Gates, female    DOB: [birth year], 56 y.o.   MRN: [id number]  HPI 56 year old white female, nonsmoker, patient of Dr. Tawanna Cooler is in today with complaints of sore throat, cough, congestion, and ear pain atenolol for 3 weeks off and on. Her symptoms worsen despite taking over-the-counter medication. She continues to take ibuprofen with little to no relief. She denies any lightheadedness, dizziness, chest pain, palpitations, shortness of breath or edema.   Review of Systems  Constitutional: Positive for fever and fatigue.  HENT: Positive for congestion, sore throat, sneezing and postnasal drip.   Eyes: Negative.   Respiratory: Positive for cough.   Cardiovascular: Negative.   Genitourinary: Negative.   Musculoskeletal: Negative.   Neurological: Negative.   Hematological: Negative.   Psychiatric/Behavioral: Negative.        Objective:   Physical Exam  Constitutional: She is oriented to person, place, and time. She appears well-developed and well-nourished.  HENT:  Right Ear: External ear normal.  Left Ear: External ear normal.  Nose: Nose normal.  Mouth/Throat: Oropharynx is clear and moist.       Moderately red pharynx  Neck: Normal range of motion. Neck supple.  Cardiovascular: Normal rate and normal heart sounds.   Pulmonary/Chest: Effort normal and breath sounds normal.  Neurological: She is alert and oriented to person, place, and time.  Skin: Skin is warm and dry.  Psychiatric: She has a normal mood and affect.          Assessment & Plan:  Assessment: Upper Resp Infection, Cough, and Migraine Headache  Plan: Amoxicillin 500 mg 2 capsules by mouth twice a day x10 days. Over-the-counter symptomatic treatment for relief.  . Plan fluids. Call the office if symptoms worsen or persist or recheck as scheduled, and when necessary.  Plan:

## 2011-09-09 ENCOUNTER — Ambulatory Visit (INDEPENDENT_AMBULATORY_CARE_PROVIDER_SITE_OTHER): Payer: BC Managed Care – PPO | Admitting: Family Medicine

## 2011-09-09 ENCOUNTER — Encounter: Payer: Self-pay | Admitting: Family Medicine

## 2011-09-09 VITALS — BP 124/84 | Temp 98.7°F | Wt 243.0 lb

## 2011-09-09 DIAGNOSIS — J45901 Unspecified asthma with (acute) exacerbation: Secondary | ICD-10-CM

## 2011-09-09 NOTE — Patient Instructions (Signed)
Stop the antibiotics  Take the prednisone as directed and use one puff of albuterol 3 times daily  Hydromet 1/2 teaspoon 3 times daily when necessary  Drink lots of water  Followup in 48 hours sooner if any problems

## 2011-09-09 NOTE — Progress Notes (Signed)
  Subjective:    Patient ID: Rachel Gates, female    DOB: 02/01/1956, 56 y.o.   MRN: [id number]  HPI Rachel Gates is a 56 year old married female nonsmoker who comes in today for evaluation of a cough  She was seen here last vein with a 24 history of sore throat head congestion runny nose and cough and fever and myalgias. She was given amoxicillin 1 g twice a day????????and she comes in today feeling no better. She has had a history of asthma in the past.   Review of Systems    Gen. Pulmonary view systems otherwise negative. She did have a flu shot in the fall Objective:   Physical Exam  Well-developed well-nourished female in no acute distress respiratory rate 12 and regular and unlabored. HEENT negative neck was supple no adenopathy lungs are clear except late expiratory symmetrical bilateral wheezing. Again both ears are normal eardrums are normal no fluid or erythema      Assessment & Plan:  Viral syndrome with secondary asthma plan DC the antibiotics, drink lots of liquids rest at home Tylenol for fever and chills begin prednisone followup in 48 hours sooner if any problems DC the Robitussin switched to Hytrin at one half teaspoon 3 times a day. For cough albuterol 1 puff twice a day

## 2011-09-11 ENCOUNTER — Ambulatory Visit (INDEPENDENT_AMBULATORY_CARE_PROVIDER_SITE_OTHER): Payer: BC Managed Care – PPO | Admitting: Family Medicine

## 2011-09-11 ENCOUNTER — Encounter: Payer: Self-pay | Admitting: Family Medicine

## 2011-09-11 ENCOUNTER — Observation Stay (HOSPITAL_COMMUNITY)
Admission: AD | Admit: 2011-09-11 | Discharge: 2011-09-13 | Disposition: A | Discharge status: Home / Self Care | Payer: BC Managed Care – PPO | Source: Ambulatory Visit | Attending: Internal Medicine | Admitting: Internal Medicine

## 2011-09-11 ENCOUNTER — Other Ambulatory Visit: Payer: Self-pay

## 2011-09-11 ENCOUNTER — Inpatient Hospital Stay (HOSPITAL_COMMUNITY): Payer: BC Managed Care – PPO

## 2011-09-11 DIAGNOSIS — D594 Other nonautoimmune hemolytic anemias: Secondary | ICD-10-CM | POA: Insufficient documentation

## 2011-09-11 DIAGNOSIS — F329 Major depressive disorder, single episode, unspecified: Secondary | ICD-10-CM

## 2011-09-11 DIAGNOSIS — J309 Allergic rhinitis, unspecified: Secondary | ICD-10-CM

## 2011-09-11 DIAGNOSIS — I10 Essential (primary) hypertension: Secondary | ICD-10-CM | POA: Insufficient documentation

## 2011-09-11 DIAGNOSIS — Z79899 Other long term (current) drug therapy: Secondary | ICD-10-CM | POA: Insufficient documentation

## 2011-09-11 DIAGNOSIS — J45901 Unspecified asthma with (acute) exacerbation: Principal | ICD-10-CM | POA: Insufficient documentation

## 2011-09-11 DIAGNOSIS — R079 Chest pain, unspecified: Secondary | ICD-10-CM | POA: Insufficient documentation

## 2011-09-11 DIAGNOSIS — Z7982 Long term (current) use of aspirin: Secondary | ICD-10-CM | POA: Insufficient documentation

## 2011-09-11 DIAGNOSIS — E663 Overweight: Secondary | ICD-10-CM

## 2011-09-11 DIAGNOSIS — F3289 Other specified depressive episodes: Secondary | ICD-10-CM | POA: Insufficient documentation

## 2011-09-11 DIAGNOSIS — J209 Acute bronchitis, unspecified: Secondary | ICD-10-CM | POA: Insufficient documentation

## 2011-09-11 DIAGNOSIS — E669 Obesity, unspecified: Secondary | ICD-10-CM | POA: Insufficient documentation

## 2011-09-11 DIAGNOSIS — F411 Generalized anxiety disorder: Secondary | ICD-10-CM | POA: Insufficient documentation

## 2011-09-11 DIAGNOSIS — E785 Hyperlipidemia, unspecified: Secondary | ICD-10-CM | POA: Insufficient documentation

## 2011-09-11 LAB — COMPREHENSIVE METABOLIC PANEL
Albumin: 3.9 g/dL (ref 3.5–5.2)
BUN: 20 mg/dL (ref 6–23)
CO2: 27 mEq/L (ref 19–32)
Chloride: 102 mEq/L (ref 96–112)
Creatinine, Ser: 0.57 mg/dL (ref 0.50–1.10)
GFR calc Af Amer: >90 mL/min (ref >90)
GFR calc non Af Amer: >90 mL/min (ref >90)
Glucose, Bld: 133 mg/dL — ABNORMAL HIGH (ref 70–99)
Total Bilirubin: 0.3 mg/dL (ref 0.3–1.2)

## 2011-09-11 LAB — CBC
HCT: 43.8 % (ref 36.0–46.0)
Hemoglobin: 14.8 g/dL (ref 12.0–15.0)
MCV: 93 fL (ref 78.0–100.0)
RDW: 13.2 % (ref 11.5–15.5)
WBC: 9.2 10*3/uL (ref 4.0–10.5)

## 2011-09-11 LAB — CARDIAC PANEL(CRET KIN+CKTOT+MB+TROPI)
Relative Index: INVALID (ref 0.0–2.5)
Troponin I: <0.3 ng/mL (ref <0.30)

## 2011-09-11 MED ORDER — MOXIFLOXACIN HCL 400 MG PO TABS
400.0000 mg | ORAL_TABLET | Freq: Every day | ORAL | Status: DC
  Administered 2011-09-12: 400 mg via ORAL
  Filled 2011-09-11 (×2): qty 1

## 2011-09-11 MED ORDER — SODIUM CHLORIDE 0.9 % IV SOLN: INTRAVENOUS | Status: AC

## 2011-09-11 MED ORDER — CITALOPRAM HYDROBROMIDE 20 MG PO TABS
20.0000 mg | ORAL_TABLET | Freq: Every day | ORAL | Status: DC
  Administered 2011-09-11 – 2011-09-12 (×2): 20 mg via ORAL
  Filled 2011-09-11 (×3): qty 1

## 2011-09-11 MED ORDER — SIMVASTATIN 20 MG PO TABS
20.0000 mg | ORAL_TABLET | Freq: Every day | ORAL | Status: DC
  Administered 2011-09-11 – 2011-09-12 (×2): 20 mg via ORAL
  Filled 2011-09-11 (×3): qty 1

## 2011-09-11 MED ORDER — PREDNISONE 20 MG PO TABS
40.0000 mg | ORAL_TABLET | Freq: Every day | ORAL | Status: DC
  Administered 2011-09-11 – 2011-09-13 (×3): 40 mg via ORAL
  Filled 2011-09-11 (×5): qty 2

## 2011-09-11 MED ORDER — ENOXAPARIN SODIUM 40 MG/0.4ML ~~LOC~~ SOLN
40.0000 mg | SUBCUTANEOUS | Status: DC
  Administered 2011-09-11 – 2011-09-12 (×2): 40 mg via SUBCUTANEOUS
  Filled 2011-09-11 (×3): qty 0.4

## 2011-09-11 MED ORDER — ASPIRIN EC 81 MG PO TBEC
81.0000 mg | DELAYED_RELEASE_TABLET | Freq: Every day | ORAL | Status: DC
  Administered 2011-09-11 – 2011-09-12 (×2): 81 mg via ORAL
  Filled 2011-09-11 (×3): qty 1

## 2011-09-11 MED ORDER — ALBUTEROL SULFATE (5 MG/ML) 0.5% IN NEBU: 2.5000 mg | INHALATION_SOLUTION | RESPIRATORY_TRACT | Status: DC | PRN

## 2011-09-11 MED ORDER — LORATADINE 10 MG PO TABS
10.0000 mg | ORAL_TABLET | Freq: Every day | ORAL | Status: DC
  Administered 2011-09-11 – 2011-09-13 (×3): 10 mg via ORAL
  Filled 2011-09-11 (×3): qty 1

## 2011-09-11 MED ORDER — HYDROCODONE-ACETAMINOPHEN 5-325 MG PO TABS
1.0000 | ORAL_TABLET | Freq: Three times a day (TID) | ORAL | Status: DC | PRN
  Administered 2011-09-11: 1 via ORAL
  Filled 2011-09-11: qty 1

## 2011-09-11 MED ORDER — NADOLOL 20 MG PO TABS
20.0000 mg | ORAL_TABLET | Freq: Every day | ORAL | Status: DC
  Administered 2011-09-11: 21:00:00 via ORAL
  Administered 2011-09-12: 20 mg via ORAL
  Filled 2011-09-11 (×3): qty 1

## 2011-09-11 MED ORDER — ATENOLOL 50 MG PO TABS
50.0000 mg | ORAL_TABLET | ORAL | Status: DC
  Administered 2011-09-12 – 2011-09-13 (×2): 50 mg via ORAL
  Filled 2011-09-11 (×3): qty 1

## 2011-09-11 MED ORDER — IPRATROPIUM BROMIDE 0.02 % IN SOLN
0.5000 mg | RESPIRATORY_TRACT | Status: DC | PRN
  Administered 2011-09-12: 0.5 mg via RESPIRATORY_TRACT
  Filled 2011-09-11: qty 2.5

## 2011-09-11 MED ORDER — GUAIFENESIN-CODEINE 100-10 MG/5ML PO SYRP
5.0000 mL | ORAL_SOLUTION | Freq: Three times a day (TID) | ORAL | Status: DC | PRN
  Filled 2011-09-11: qty 5

## 2011-09-11 NOTE — Progress Notes (Signed)
  Subjective:    Patient ID: Rachel Gates, female    DOB: 1955/10/15, 56 y.o.   MRN: [id number]  HPI Areanna is a 56 year old married female nonsmoker who comes in today accompanied by her husband because of asthma She had a viral syndrome last Thursday week ago manifested by fever chills aching all over sore throat nonproductive cough. This past Monday we saw her here in the office with some mild wheezing. We started her on a short course of prednisone and an albuterol MDI 2 puffs 3 times a day. She went home and was doing well until last night when her asthma became worse. Last night she was unable to sleep she could not lie flat could not get any air in. She comes in today. Because of her severe shortness of breath we gave her a nebulizer with albuterol that really didn't help very much.  She's never had a bad asthma attack like this previously. She's had minor episodes when she said the allergies but never an asthma attack this bad.    Review of Systems    general and pulmonary view of systems otherwise negative Objective:   Physical Exam  Well-developed well-nourished female crying because she is short of breath and can't breathe  HEENT negative neck was supple lungs shows inspiratory and expiratory wheezing and she's moving very little air pulse ox 98 after the nebulizer      Assessment & Plan:  Severe asthma plan admit IV meds

## 2011-09-11 NOTE — Patient Instructions (Signed)
Go directly to [facility] for admission

## 2011-09-11 NOTE — H&P (Signed)
PCP:   Evette Georges, MD, MD   Chief Complaint:  Shortness of breath  HPI: 78 old female who was sent from Dr. Nelida Meuse office for asthma exacerbation. Patient does with that she has had runny nose sore throat cough, high fevers going on for last one week. Patient initially was started on amoxicillin for 3 days but after she had no response to amoxicillin she was switched to by mouth prednisone. Patient has been taking prednisone for last 5 days with not much benefit. Patient says that she has nagging cough and is also coughing up clear sputum. She admits to having chest pain associated with cough. She admits to headache. No nausea vomiting or diarrhea. Patient does not have history of asthma and does not take any inhalers at home.  Allergies:  No Known Allergies    Past Medical History  Diagnosis Date  . MHA (microangiopathic hemolytic anemia)   . Allergy   . Hyperlipidemia   . Anxiety   . Depression   . Hypertension   . Obese     Past Surgical History  Procedure Date  . Abdominal hysterectomy   . Cholecystectomy     Prior to Admission medications   Medication Sig Start Date End Date Taking? Authorizing Provider  aspirin 81 MG EC tablet Take 81 mg by mouth daily.     Yes Historical Provider, MD  atenolol (TENORMIN) 50 MG tablet Take 25 mg by mouth daily.  02/20/11 02/20/12 Yes Evette Georges, MD  calcium carbonate (OS-CAL) 600 MG TABS Take 600 mg by mouth 2 (two) times daily with a meal.     Yes Historical Provider, MD  cetirizine (ZYRTEC) 10 MG tablet Take 10 mg by mouth daily.     Yes Historical Provider, MD  citalopram (CELEXA) 20 MG tablet Take 1 tablet (20 mg total) by mouth daily. 02/20/11  Yes Evette Georges, MD  fish oil-omega-3 fatty acids 1000 MG capsule Take 1 g by mouth daily.     Yes Historical Provider, MD  HYDROcodone-acetaminophen (VICODIN) 5-500 MG per tablet Take 1 tablet by mouth every 8 (eight) hours as needed.  02/17/11  Yes Historical Provider, MD    HYDROcodone-homatropine (HYCODAN) 5-1.5 MG/5ML syrup Take 5 mLs by mouth every 6 (six) hours as needed. cough   Yes Historical Provider, MD  Multiple Vitamin (MULTIVITAMIN) tablet Take 1 tablet by mouth daily.     Yes Historical Provider, MD  nadolol (CORGARD) 20 MG tablet Take 1 tablet (20 mg total) by mouth at bedtime. 02/20/11 02/20/12 Yes Evette Georges, MD  simvastatin (ZOCOR) 20 MG tablet Take 1 tablet (20 mg total) by mouth at bedtime. 02/20/11 02/20/12 Yes Evette Georges, MD    Social History:  reports that she has never smoked. She has never used smokeless tobacco. She reports that she does not drink alcohol or use illicit drugs.  Family History  Problem Relation Age of Onset  . Hypertension Mother   . Cancer Mother     uterine  . Angina Mother   . Heart attack Father   . Glaucoma Father     Review of Systems:  HEENT: Positive headache, runny nose, sore throat,  Neck: Denies thyroid problems,lymphadenopathy Chest : Altered shortness of breath, no COPD Heart : Cough associated Chest pain, denies coronary arterey disease GI: Denies  nausea, vomiting, diarrhea, constipation GU: Denies dysuria, urgency, frequency of urination, hematuria Neuro: Denies stroke, seizures, syncope Psych: Denies depression, anxiety, hallucinations Extremities : No Cyanosis, Clubbing or  Edema  Physical Exam: Constitutional: Vital signs reviewed.  Patient is a well-developed and well-nourished female in no acute distress and cooperative with exam. Head: Normocephalic and atraumatic Mouth: Mucus membranes moist Eyes: PERRL, EOMI, conjunctivae normal Neck: Supple, No Thyromegaly Cardiovascular: RRR, S1 normal, S2 normal Pulmonary/Chest: CTAB, no wheezes, rales, or rhonchi Abdominal: Soft. Non-tender, non-distended, bowel sounds are normal, no masses, organomegaly, or guarding present.  Neurological: A&O x3, Strenght is normal and symmetric bilaterally, cranial nerve II-XII are grossly intact,  no focal motor deficit, sensory intact to light touch bilaterally.     Assessment/Plan  Acute bronchitis Patient at this time is presenting with signs and symptoms of acute bronchitis which may be viral in origin, will obtain influenza PCR, chest x-ray, CBC and a CMP. We'll start the patient on prednisone 40 mg by mouth daily, will also start her on Avelox 400 mg by mouth daily. Prednisone can be tapered over the next few days. Patient will be started on bone nebulizers every 2 hours when necessary for shortness of breath.  Hypertension We'll continue the patient on atenolol, nadolol. Patient is taking 2 beta blockers, she says that she is in on atenolol for migraine prevention. Her PCP is aware of 2 beta blockers. I'm going to hold the hydrochlorothiazide at this time. We'll follow the patient's renal functions  Depression Will continue Celexa  DVT prophylaxis Lovenox  Time Spent on Admission: 55 min  Tenishia Ekman S [facility] Pager: [phone] 09/11/2011, 6:07 PM

## 2011-09-12 DIAGNOSIS — R9431 Abnormal electrocardiogram [ECG] [EKG]: Secondary | ICD-10-CM

## 2011-09-12 LAB — CBC
HCT: 42.4 % (ref 36.0–46.0)
Hemoglobin: 14.3 g/dL (ref 12.0–15.0)
MCHC: 33.7 g/dL (ref 30.0–36.0)
MCV: 92.8 fL (ref 78.0–100.0)
RDW: 13 % (ref 11.5–15.5)

## 2011-09-12 LAB — CARDIAC PANEL(CRET KIN+CKTOT+MB+TROPI)
Troponin I: <0.3 ng/mL (ref <0.30)
Troponin I: <0.3 ng/mL (ref <0.30)

## 2011-09-12 LAB — COMPREHENSIVE METABOLIC PANEL
ALT: 25 U/L (ref 0–35)
AST: 25 U/L (ref 0–37)
Albumin: 3.6 g/dL (ref 3.5–5.2)
Alkaline Phosphatase: 72 U/L (ref 39–117)
Calcium: 9.3 mg/dL (ref 8.4–10.5)
Potassium: 3.9 mEq/L (ref 3.5–5.1)
Sodium: 140 mEq/L (ref 135–145)
Total Protein: 7.2 g/dL (ref 6.0–8.3)

## 2011-09-12 MED ORDER — ALBUTEROL SULFATE (5 MG/ML) 0.5% IN NEBU
2.5000 mg | INHALATION_SOLUTION | Freq: Four times a day (QID) | RESPIRATORY_TRACT | Status: DC
  Administered 2011-09-12 (×2): 2.5 mg via RESPIRATORY_TRACT
  Filled 2011-09-12 (×2): qty 0.5

## 2011-09-12 NOTE — Progress Notes (Signed)
*   Echocardiogram 2D Echocardiogram has been performed.  Cathie Beams Deneen 09/12/2011, 9:48 AM

## 2011-09-12 NOTE — Progress Notes (Signed)
PATIENT DETAILS Name: Rachel Gates Age: 56 y.o. Sex: female Date of Birth: 01-26-1956 Admit Date: 09/11/2011 ZOX:WRUE,AVWUJWJ ALLEN, MD, MD  Subjective: better  Objective: Vital signs in last 24 hours: Filed Vitals:   09/12/11 0245 09/12/11 0650 09/12/11 1247 09/12/11 1400  BP: 142/67 117/73  144/78  Pulse: 67 57 100 61  Temp: 97.8 F (36.6 C) 98.3 F (36.8 C)  98.6 F (37 C)  TempSrc:    Oral  Resp: 16 16  16   SpO2: 100% 99% 87% 94%    Weight change:   There is no height or weight on file to calculate BMI.  Intake/Output from previous day:  Intake/Output Summary (Last 24 hours) at 09/12/11 1647 Last data filed at 09/12/11 1200  Gross per 24 hour  Intake   1310 ml  Output      1 ml  Net   1309 ml    PHYSICAL EXAM: Gen Exam: Awake and alert with clear speech.   Neck: Supple, No JVD.   Chest: B/L good air entry-few scattered rhonchi CVS: S1 S2 Regular, no murmurs.  Abdomen: soft, BS +, non tender, non distended.  Extremities: no edema, lower extremities warm to touch. Neurologic: Non Focal.  Skin: No Rash.   Wounds: N/A.    CONSULTS:  None  LAB RESULTS: CBC  Lab 09/12/11 0340 09/11/11 1944  WBC 8.5 9.2  HGB 14.3 14.8  HCT 42.4 43.8  PLT 188 215  MCV 92.8 93.0  MCH 31.3 31.4  MCHC 33.7 33.8  RDW 13.0 13.2  LYMPHSABS -- --  MONOABS -- --  EOSABS -- --  BASOSABS -- --  BANDABS -- --    Chemistries   Lab 09/12/11 0340 09/11/11 1944  NA 140 138  K 3.9 4.2  CL 102 102  CO2 28 27  GLUCOSE 130* 133*  BUN 20 20  CREATININE 0.48* 0.57  CALCIUM 9.3 9.9  MG -- --    GFR The CrCl is unknown because both a height and weight (above a minimum accepted value) are required for this calculation.  Coagulation profile No results found for this basename: INR:5,PROTIME:5 in the last 168 hours  Cardiac Enzymes  Lab 09/12/11 1049 09/12/11 0340 09/11/11 1944  CKMB 1.8 2.0 2.3  TROPONINI <0.30 <0.30 <0.30  MYOGLOBIN -- -- --    No components  found with this basename: POCBNP:3 No results found for this basename: DDIMER:2 in the last 72 hours No results found for this basename: HGBA1C:2 in the last 72 hours No results found for this basename: CHOL:2,HDL:2,LDLCALC:2,TRIG:2,CHOLHDL:2,LDLDIRECT:2 in the last 72 hours No results found for this basename: TSH,T4TOTAL,FREET3,T3FREE,THYROIDAB in the last 72 hours No results found for this basename: VITAMINB12:2,FOLATE:2,FERRITIN:2,TIBC:2,IRON:2,RETICCTPCT:2 in the last 72 hours No results found for this basename: LIPASE:2,AMYLASE:2 in the last 72 hours  Urine Studies No results found for this basename: UACOL:2,UAPR:2,USPG:2,UPH:2,UTP:2,UGL:2,UKET:2,UBIL:2,UHGB:2,UNIT:2,UROB:2,ULEU:2,UEPI:2,UWBC:2,URBC:2,UBAC:2,CAST:2,CRYS:2,UCOM:2,BILUA:2 in the last 72 hours  MICROBIOLOGY: No results found for this or any previous visit (from the past 240 hour(s)).  RADIOLOGY STUDIES/RESULTS: X-ray Chest Pa And Lateral   09/11/2011  *RADIOLOGY REPORT*  Clinical Data: Shortness of breath  CHEST - 2 VIEW  Comparison: 07/24/2010  Findings: Stable mild cardiomegaly.  Patchy left lower lobe infiltrate or atelectasis partially obscuring the diaphragmatic leaflet.  No definite effusion.  Minimal thoracic spondylitic change.  IMPRESSION:  1.  Stable cardiomegaly. 2.  Left lower lobe patchy infiltrate or atelectasis.  Original Report Authenticated By: Osa Craver, M.D.    MEDICATIONS: Scheduled Meds:   .  albuterol  2.5 mg Nebulization Q6H  . aspirin EC  81 mg Oral Daily  . atenolol  50 mg Oral BH-q7a  . citalopram  20 mg Oral Daily  . enoxaparin  40 mg Subcutaneous Q24H  . loratadine  10 mg Oral Daily  . moxifloxacin  400 mg Oral q1800  . nadolol  20 mg Oral QHS  . predniSONE  40 mg Oral Q breakfast  . simvastatin  20 mg Oral QHS   Continuous Infusions:   . sodium chloride     PRN Meds:.albuterol, guaiFENesin-codeine, HYDROcodone-acetaminophen, ipratropium  Antibiotics: Anti-infectives       Start     Dose/Rate Route Frequency Ordered Stop   09/12/11 1800   moxifloxacin (AVELOX) tablet 400 mg        400 mg Oral Daily-1800 09/11/11 1837            Assessment/Plan: Patient Active Hospital Problem List: Acute Asthmatic Bronchitis -better -resume scheduled nebs -continue with prednisone -continue with empiric avelox  HTN -controlled -continue with current meds  Dyslipidemia -continue with zocor -further monitoring of lipids to be done as outpatient  Disposition: Home in am  DVT Prophylaxis: Lovenox  Code Status: Full Code  Maretta Bees, MD. 09/12/2011, 4:47 PM

## 2011-09-13 MED ORDER — FLUTICASONE-SALMETEROL 250-50 MCG/DOSE IN AEPB: 1.0000 | INHALATION_SPRAY | Freq: Two times a day (BID) | RESPIRATORY_TRACT | Status: DC

## 2011-09-13 MED ORDER — MOXIFLOXACIN HCL 400 MG PO TABS: 400.0000 mg | ORAL_TABLET | Freq: Every day | ORAL | Status: AC

## 2011-09-13 MED ORDER — ALBUTEROL SULFATE HFA 108 (90 BASE) MCG/ACT IN AERS: 2.0000 | INHALATION_SPRAY | RESPIRATORY_TRACT | Status: DC | PRN

## 2011-09-13 MED ORDER — ALBUTEROL SULFATE (5 MG/ML) 0.5% IN NEBU
2.5000 mg | INHALATION_SOLUTION | Freq: Three times a day (TID) | RESPIRATORY_TRACT | Status: DC
  Administered 2011-09-13: 2.5 mg via RESPIRATORY_TRACT
  Filled 2011-09-13: qty 0.5

## 2011-09-13 MED ORDER — PREDNISONE 10 MG PO TABS: ORAL_TABLET | ORAL | Status: DC

## 2011-09-13 NOTE — Discharge Summary (Signed)
PATIENT DETAILS Name: Rachel Gates Age: 56 y.o. Sex: female Date of Birth: 01/03/1956 MRN: [id number]. Admit Date: 09/11/2011 Admitting Physician: Jeoffrey Massed, MD NWG:NFAO,[id number] ALLEN, MD, MD  PRIMARY DISCHARGE DIAGNOSIS:  Active Problems: Acute Asthmatic Bronchitis       PAST MEDICAL HISTORY: Past Medical History  Diagnosis Date  . MHA (microangiopathic hemolytic anemia)   . Allergy   . Hyperlipidemia   . Anxiety   . Depression   . Hypertension   . Obese     DISCHARGE MEDICATIONS: Medication List  As of 09/13/2011 11:07 AM   TAKE these medications         albuterol 108 (90 BASE) MCG/ACT inhaler   Commonly known as: PROVENTIL HFA;VENTOLIN HFA   Inhale 2 puffs into the lungs every 4 (four) hours as needed for wheezing.      aspirin 81 MG EC tablet   Take 81 mg by mouth daily.      atenolol 50 MG tablet   Commonly known as: TENORMIN   Take 25 mg by mouth daily.      calcium carbonate 600 MG Tabs   Commonly known as: OS-CAL   Take 600 mg by mouth 2 (two) times daily with a meal.      cetirizine 10 MG tablet   Commonly known as: ZYRTEC   Take 10 mg by mouth daily.      citalopram 20 MG tablet   Commonly known as: CELEXA   Take 1 tablet (20 mg total) by mouth daily.      fish oil-omega-3 fatty acids 1000 MG capsule   Take 1 g by mouth daily.      Fluticasone-Salmeterol 250-50 MCG/DOSE Aepb   Commonly known as: ADVAIR   Inhale 1 puff into the lungs 2 (two) times daily.      HYDROcodone-acetaminophen 5-500 MG per tablet   Commonly known as: VICODIN   Take 1 tablet by mouth every 8 (eight) hours as needed.      HYDROcodone-homatropine 5-1.5 MG/5ML syrup   Commonly known as: HYCODAN   Take 5 mLs by mouth every 6 (six) hours as needed. cough      moxifloxacin 400 MG tablet   Commonly known as: AVELOX   Take 1 tablet (400 mg total) by mouth daily at 6 PM.      multivitamin tablet   Take 1 tablet by mouth daily.      nadolol 20 MG tablet   Commonly known as: CORGARD   Take 1 tablet (20 mg total) by mouth at bedtime.      predniSONE 10 MG tablet   Commonly known as: DELTASONE   Take 4 tablets daily for 3 days,then  Take 3 tablets daily for 4 days, then  Take 2 tablets daily for 4 days, then  Take 1 tablets daily for 2 days and then discontinue      simvastatin 20 MG tablet   Commonly known as: ZOCOR   Take 1 tablet (20 mg total) by mouth at bedtime.             BRIEF HPI:  See H&P, Labs, Consult and Test reports for all details in brief, patient was admitted for worsening shortness of breath and cough. For further details please see the history and physical that was dictated on admission.  CONSULTATIONS:   None  PERTINENT RADIOLOGIC STUDIES: X-ray Chest Pa And Lateral   09/11/2011  *RADIOLOGY REPORT*  Clinical Data: Shortness of breath  CHEST - 2 VIEW  Comparison: 07/24/2010  Findings: Stable mild cardiomegaly.  Patchy left lower lobe infiltrate or atelectasis partially obscuring the diaphragmatic leaflet.  No definite effusion.  Minimal thoracic spondylitic change.  IMPRESSION:  1.  Stable cardiomegaly. 2.  Left lower lobe patchy infiltrate or atelectasis.  Original Report Authenticated By: Osa Craver, M.D.     PERTINENT LAB RESULTS: CBC:  Basename 09/12/11 0340 09/11/11 1944  WBC 8.5 9.2  HGB 14.3 14.8  HCT 42.4 43.8  PLT 188 215   CMET CMP     Component Value Date/Time   NA 140 09/12/2011 0340   K 3.9 09/12/2011 0340   CL 102 09/12/2011 0340   CO2 28 09/12/2011 0340   GLUCOSE 130* 09/12/2011 0340   BUN 20 09/12/2011 0340   CREATININE 0.48* 09/12/2011 0340   CALCIUM 9.3 09/12/2011 0340   PROT 7.2 09/12/2011 0340   ALBUMIN 3.6 09/12/2011 0340   AST 25 09/12/2011 0340   ALT 25 09/12/2011 0340   ALKPHOS 72 09/12/2011 0340   BILITOT 0.3 09/12/2011 0340   GFRNONAA >90 09/12/2011 0340   GFRAA >90 09/12/2011 0340    GFR The CrCl is unknown because both a height and weight (above a minimum accepted value) are  required for this calculation. No results found for this basename: LIPASE:2,AMYLASE:2 in the last 72 hours  Basename 09/12/11 1049 09/12/11 0340 09/11/11 1944  CKTOTAL 41 50 55  CKMB 1.8 2.0 2.3  CKMBINDEX -- -- --  TROPONINI <0.30 <0.30 <0.30   No components found with this basename: POCBNP:3 No results found for this basename: DDIMER:2 in the last 72 hours No results found for this basename: HGBA1C:2 in the last 72 hours No results found for this basename: CHOL:2,HDL:2,LDLCALC:2,TRIG:2,CHOLHDL:2,LDLDIRECT:2 in the last 72 hours No results found for this basename: TSH,T4TOTAL,FREET3,T3FREE,THYROIDAB in the last 72 hours No results found for this basename: VITAMINB12:2,FOLATE:2,FERRITIN:2,TIBC:2,IRON:2,RETICCTPCT:2 in the last 72 hours Coags: No results found for this basename: PT:2,INR:2 in the last 72 hours Microbiology: No results found for this or any previous visit (from the past 240 hour(s)).   BRIEF HOSPITAL COURSE:   Active Problems:  Acute asthmatic bronchitis -Patient was admitted to regular medical bed, laced on steroids and given nebulized bronchodilators -She was also empirically placed on antibiotic -Slowly she made clinical improvement, today she is very active to go home lungs are clear to auscultation -On ambulation, O2 saturations was 91% and at rest and then around 95% -Patient will be discharged on a steroid taper, we'll continue empiric Avelox for the next few days. She will be provided with Advair, she will continue to use albuterol as a rescue inhaler.  Rest of the patient's medical conditions were stable   TODAY-DAY OF DISCHARGE:  Subjective:   Shatera Gates today has no headache,no chest abdominal pain,no new weakness tingling or numbness, feels much better wants to go home today.   Objective:   Blood pressure 134/79, pulse 62, temperature 97.8 F (36.6 C), temperature source Oral, resp. rate 18, SpO2 95.00%.  Intake/Output Summary (Last 24 hours)  at 09/13/11 1107 Last data filed at 09/13/11 0900  Gross per 24 hour  Intake    480 ml  Output      0 ml  Net    480 ml    Exam Awake Alert, Oriented *3, No new F.N deficits, Normal affect [place].AT,PERRAL Supple Neck,No JVD, No cervical lymphadenopathy appriciated.  Symmetrical Chest wall movement, Good air movement bilaterally, CTAB RRR,No Gallops,Rubs or new Murmurs, No Parasternal Heave +ve  B.Sounds, Abd Soft, Non tender, No organomegaly appriciated, No rebound -guarding or rigidity. No Cyanosis, Clubbing or edema, No new Rash or bruise  DISPOSITION:   DISCHARGE INSTRUCTIONS:    Follow-up Information    Follow up with TODD,JEFFREY ALLEN, MD. Schedule an appointment as soon as possible for a visit in 1 week.   Contact information:   [address] [place] [address] [phone]           Total Time spent on discharge equals 45 minutes.  SignedJeoffrey Massed 09/13/2011 11:07 AM

## 2011-09-15 ENCOUNTER — Encounter: Payer: Self-pay | Admitting: Family Medicine

## 2011-09-15 ENCOUNTER — Ambulatory Visit (INDEPENDENT_AMBULATORY_CARE_PROVIDER_SITE_OTHER): Payer: BC Managed Care – PPO | Admitting: Family Medicine

## 2011-09-15 DIAGNOSIS — J45901 Unspecified asthma with (acute) exacerbation: Secondary | ICD-10-CM

## 2011-09-15 NOTE — Patient Instructions (Signed)
Take a second 20 mg prednisone tablet this afternoon when he got home  Starting tomorrow take 40 mg daily for 5 days, 20 mg daily for 5 days, 10 mg daily for 5 days, then 10 mg Monday Wednesday Friday for a three-week taper  Return in 2 weeks for followup sooner if any problems

## 2011-09-15 NOTE — Progress Notes (Signed)
  Subjective:    Patient ID: Rachel Gates, female    DOB: 12/30/1955, 56 y.o.   MRN: [id number]  HPI Rachel Gates is a 56 year old female who comes in today for followup of asthma  We saw her about 10 days ago with a flareup of her asthma but last Thursday was worse and we admitted to the hospital. She was discharged on Saturday. Chest x-ray showed no pneumonia. She's now down to 20 mg of prednisone a day and she states she's about 50% better.   Review of Systems    general and pulmonary review of systems otherwise negative Objective:   Physical Exam  Well-developed well-nourished female in no acute distress respiratory rate was 10 and unlabored HEENT negative neck was supple lungs showed symmetrical breath sounds mild late expiratory wheezing      Assessment & Plan:  Asthma improving plan Taper prednisone slowly return in 2 weeks or

## 2011-10-14 ENCOUNTER — Other Ambulatory Visit (HOSPITAL_COMMUNITY): Payer: Self-pay | Admitting: Obstetrics and Gynecology

## 2011-10-14 DIAGNOSIS — Z1231 Encounter for screening mammogram for malignant neoplasm of breast: Secondary | ICD-10-CM

## 2011-11-06 ENCOUNTER — Ambulatory Visit (HOSPITAL_COMMUNITY)
Admission: RE | Admit: 2011-11-06 | Discharge: 2011-11-06 | Disposition: A | Discharge status: Home / Self Care | Payer: BC Managed Care – PPO | Source: Ambulatory Visit | Attending: Obstetrics and Gynecology | Admitting: Obstetrics and Gynecology

## 2011-11-06 DIAGNOSIS — Z1231 Encounter for screening mammogram for malignant neoplasm of breast: Secondary | ICD-10-CM

## 2012-02-21 ENCOUNTER — Other Ambulatory Visit: Payer: Self-pay | Admitting: Family Medicine

## 2012-03-30 ENCOUNTER — Encounter: Payer: BC Managed Care – PPO | Admitting: Family Medicine

## 2012-04-15 ENCOUNTER — Ambulatory Visit (INDEPENDENT_AMBULATORY_CARE_PROVIDER_SITE_OTHER): Payer: BC Managed Care – PPO | Admitting: Family Medicine

## 2012-04-15 ENCOUNTER — Encounter: Payer: Self-pay | Admitting: Family Medicine

## 2012-04-15 VITALS — BP 112/80 | Temp 98.0°F | Ht 67.0 in | Wt 224.0 lb

## 2012-04-15 DIAGNOSIS — F411 Generalized anxiety disorder: Secondary | ICD-10-CM

## 2012-04-15 DIAGNOSIS — E663 Overweight: Secondary | ICD-10-CM

## 2012-04-15 DIAGNOSIS — J309 Allergic rhinitis, unspecified: Secondary | ICD-10-CM

## 2012-04-15 DIAGNOSIS — I10 Essential (primary) hypertension: Secondary | ICD-10-CM

## 2012-04-15 DIAGNOSIS — F3289 Other specified depressive episodes: Secondary | ICD-10-CM

## 2012-04-15 DIAGNOSIS — Z23 Encounter for immunization: Secondary | ICD-10-CM

## 2012-04-15 DIAGNOSIS — E785 Hyperlipidemia, unspecified: Secondary | ICD-10-CM

## 2012-04-15 DIAGNOSIS — F329 Major depressive disorder, single episode, unspecified: Secondary | ICD-10-CM

## 2012-04-15 MED ORDER — ATENOLOL 50 MG PO TABS: 25.0000 mg | ORAL_TABLET | Freq: Every day | ORAL | Status: DC

## 2012-04-15 MED ORDER — CITALOPRAM HYDROBROMIDE 20 MG PO TABS: 20.0000 mg | ORAL_TABLET | Freq: Every day | ORAL | Status: DC

## 2012-04-15 MED ORDER — NADOLOL 20 MG PO TABS: 20.0000 mg | ORAL_TABLET | Freq: Every day | ORAL | Status: DC

## 2012-04-15 MED ORDER — SIMVASTATIN 20 MG PO TABS: 20.0000 mg | ORAL_TABLET | Freq: Every day | ORAL | Status: DC

## 2012-04-15 NOTE — Progress Notes (Signed)
  Subjective:    Patient ID: Rachel Gates, female    DOB: 05/27/1956, 56 y.o.   MRN: [id number]  HPI Rachel Gates is a 56 year old married female nonsmoker who comes in today for general physical examination  She takes an aspirin tablet daily Tenormin 25 mg daily for hypertension BP 112/80 which is lower than her previous pressures because she's got a diet and exercise program and has lost 24 pounds. She also takes Zyrtec 10 mg each bedtime for allergic rhinitis and Celexa 20 mg each bedtime for mild depression.  She takes Corgard 20 mg nightly to prevent migraine headaches.  She gets routine eye care, dental care, BSE monthly, and you mammography, colonoscopy and GI, tetanus 2005   Review of Systems  Constitutional: Negative.   HENT: Negative.   Eyes: Negative.   Respiratory: Negative.   Cardiovascular: Negative.   Gastrointestinal: Negative.   Genitourinary: Negative.   Musculoskeletal: Negative.   Neurological: Negative.   Hematological: Negative.   Psychiatric/Behavioral: Negative.        Objective:   Physical Exam  Constitutional: She appears well-developed and well-nourished.  HENT:  Head: Normocephalic and atraumatic.  Right Ear: External ear normal.  Left Ear: External ear normal.  Nose: Nose normal.  Mouth/Throat: Oropharynx is clear and moist.  Eyes: EOM are normal. Pupils are equal, round, and reactive to light.  Neck: Normal range of motion. Neck supple. No thyromegaly present.  Cardiovascular: Normal rate, regular rhythm, normal heart sounds and intact distal pulses.  Exam reveals no gallop and no friction rub.   No murmur heard. Pulmonary/Chest: Effort normal and breath sounds normal.  Abdominal: Soft. Bowel sounds are normal. She exhibits no distension and no mass. There is no tenderness. There is no rebound.  Genitourinary:       Bilateral breast exam normal  Musculoskeletal: Normal range of motion.  Lymphadenopathy:    She has no cervical adenopathy.    Neurological: She is alert. She has normal reflexes. No cranial nerve deficit. She exhibits normal muscle tone. Coordination normal.  Skin: Skin is warm and dry.  Psychiatric: She has a normal mood and affect. Her behavior is normal. Judgment and thought content normal.          Assessment & Plan:  Healthy female  Overweight continue diet and exercise  Hypertension continue Tenormin 25 mg and an aspirin tablet  Allergic rhinitis continue Zyrtec  Depression continue Celexa  Migraine headaches continue Corgard but decrease dose to 10 mg daily.  Abnormal appearing mole left side of face return for removal

## 2012-04-15 NOTE — Patient Instructions (Addendum)
Continue your current medications except decrease the Corgard to one half tablet daily at bedtime  Continue diet and exercise program  Return in one year sooner if any problems  Return sometime in the next month for a 15 minute appointment to remove the lesion on your face

## 2012-04-27 ENCOUNTER — Ambulatory Visit (INDEPENDENT_AMBULATORY_CARE_PROVIDER_SITE_OTHER): Payer: BC Managed Care – PPO | Admitting: Family Medicine

## 2012-04-27 ENCOUNTER — Encounter: Payer: Self-pay | Admitting: Family Medicine

## 2012-04-27 DIAGNOSIS — L820 Inflamed seborrheic keratosis: Secondary | ICD-10-CM

## 2012-04-27 NOTE — Progress Notes (Signed)
  Subjective:    Patient ID: Rachel Gates, female    DOB: [birth year], 56 y.o.   MRN: [id number]  HPI Shaquanta is a 56 year old married female nonsmoker who comes in today for removal of abnormal lesion on her left forehead  She's had a lesion there that is been slowly getting bigger and recently is beginning red irritated and bleeding.  It measures 8 mm x 8 mm  After informed consent the lesion was anesthetized with 1% Xylocaine with epinephrine after an alcohol prep  The lesion was excised with 2 mm margins the base was cauterized Band-Aids was applied. She tolerated the procedure well no complications. The lesion was sent for pathologic analysis   Review of Systems General and dermatologic review of systems otherwise negative    Objective:   Physical Exam  Well-developed well-nourished female no acute distress procedure see above      Assessment & Plan:  Clinically this appears to be an inflamed seborrheic keratosis  Pending

## 2012-04-27 NOTE — Addendum Note (Signed)
Addended by: Kern Reap B on: 04/27/2012 04:22 PM   Modules accepted: Orders

## 2012-06-29 ENCOUNTER — Encounter: Payer: Self-pay | Admitting: Family Medicine

## 2012-06-29 ENCOUNTER — Ambulatory Visit (INDEPENDENT_AMBULATORY_CARE_PROVIDER_SITE_OTHER): Payer: BC Managed Care – PPO | Admitting: Family Medicine

## 2012-06-29 VITALS — BP 120/80 | Temp 97.9°F | Wt 217.0 lb

## 2012-06-29 DIAGNOSIS — D490 Neoplasm of unspecified behavior of digestive system: Secondary | ICD-10-CM

## 2012-06-29 NOTE — Patient Instructions (Signed)
Call Dr. Dillard Cannon at [phone] for consultation

## 2012-06-29 NOTE — Progress Notes (Signed)
  Subjective:    Patient ID: Rachel Gates, female    DOB: November 17, 1955, 56 y.o.   MRN: [id number]  HPI Rachel Gates is a 56 year old married female nonsmoker who comes in today for evaluation of a lump in her right jaw times one month  About a month ago she was changing her ear rings and noticed a lump on the right side of her jaw. It hasn't changed in size in the past month. She saw her dentist who wasn't sure what it was and asked her to come see [place] for evaluation  Her current weight is 217 pounds she's lost about 35 pounds she's been on a diet and exercise program   Review of Systems General and ENT review of systems otherwise negative    Objective:   Physical Exam Well-developed well-nourished female no acute distress examination of the face shows a 10 mm x10 mm hard but movable not fixed lesion in the area of the right parotid gland       Assessment & Plan:

## 2012-08-17 ENCOUNTER — Other Ambulatory Visit: Payer: Self-pay | Admitting: Otolaryngology

## 2012-08-17 DIAGNOSIS — K118 Other diseases of salivary glands: Secondary | ICD-10-CM

## 2012-08-19 ENCOUNTER — Ambulatory Visit
Admission: RE | Admit: 2012-08-19 | Discharge: 2012-08-19 | Disposition: A | Discharge status: Home / Self Care | Payer: BC Managed Care – PPO | Source: Ambulatory Visit | Attending: Otolaryngology | Admitting: Otolaryngology

## 2012-08-19 DIAGNOSIS — K118 Other diseases of salivary glands: Secondary | ICD-10-CM

## 2012-08-19 MED ORDER — IOHEXOL 300 MG/ML  SOLN
75.0000 mL | Freq: Once | INTRAMUSCULAR | Status: AC | PRN
  Administered 2012-08-19: 75 mL via INTRAVENOUS

## 2012-08-26 ENCOUNTER — Other Ambulatory Visit: Payer: Self-pay | Admitting: Otolaryngology

## 2012-10-21 ENCOUNTER — Encounter: Payer: Self-pay | Admitting: Family Medicine

## 2012-10-21 ENCOUNTER — Ambulatory Visit (INDEPENDENT_AMBULATORY_CARE_PROVIDER_SITE_OTHER): Payer: BC Managed Care – PPO | Admitting: Family Medicine

## 2012-10-21 ENCOUNTER — Encounter: Payer: Self-pay | Admitting: *Deleted

## 2012-10-21 VITALS — BP 124/80 | HR 96 | Temp 98.3°F | Wt 215.0 lb

## 2012-10-21 DIAGNOSIS — G43909 Migraine, unspecified, not intractable, without status migrainosus: Secondary | ICD-10-CM

## 2012-10-21 MED ORDER — PROMETHAZINE HCL 50 MG/ML IJ SOLN
25.0000 mg | INTRAMUSCULAR | Status: AC
  Administered 2012-10-21: 25 mg via INTRAMUSCULAR

## 2012-10-21 MED ORDER — KETOROLAC TROMETHAMINE 60 MG/2ML IM SOLN
60.0000 mg | Freq: Once | INTRAMUSCULAR | Status: AC
  Administered 2012-10-21: 60 mg via INTRAMUSCULAR

## 2012-10-21 NOTE — Addendum Note (Signed)
Addended by: Azucena Freed on: 10/21/2012 02:33 PM   Modules accepted: Orders

## 2012-10-21 NOTE — Progress Notes (Signed)
Chief Complaint  Patient presents with  . Migraine    since Tuesday night and nausea     HPI:  Acute visit for migraine: -for 2 days -reports bad migraines since she was a teenager -she takes Excedrin and Vicodin usually - but has had to go to emergency room for her migraines many times for shot of toradol and phenergan which have stopped her migraines weill in the past -this is similar to all other migraines -has aura, L sided, gets photosensitivity, nausea which is all very typical for her migraines -she has been on verapamil in the past - but BP dropped so had to stop it -migraines are only several times per year - so has not been on prophylactic meds in a long time -triptans made her sick and she did not tolerate them  ROS: See pertinent positives and negatives per HPI.  Past Medical History  Diagnosis Date  . MHA (microangiopathic hemolytic anemia)   . Allergy   . Hyperlipidemia   . Anxiety   . Depression   . Hypertension   . Obese     Family History  Problem Relation Age of Onset  . Hypertension Mother   . Cancer Mother     uterine  . Angina Mother   . Heart attack Father   . Glaucoma Father     History   Social History  . Marital Status: Married    Spouse Name: N/A    Number of Children: N/A  . Years of Education: N/A   Social History Main Topics  . Smoking status: Never Smoker   . Smokeless tobacco: Never Used  . Alcohol Use: No  . Drug Use: No  . Sexually Active: None   Other Topics Concern  . None   Social History Narrative  . None    Current outpatient prescriptions:albuterol (PROVENTIL HFA;VENTOLIN HFA) 108 (90 BASE) MCG/ACT inhaler, Inhale 2 puffs into the lungs every 4 (four) hours as needed for wheezing., Disp: 1 Inhaler, Rfl: 0;  aspirin 81 MG EC tablet, Take 81 mg by mouth daily.  , Disp: , Rfl: ;  atenolol (TENORMIN) 50 MG tablet, Take 0.5 tablets (25 mg total) by mouth daily., Disp: 50 tablet, Rfl: 3 calcium carbonate (OS-CAL) 600 MG  TABS, Take 600 mg by mouth 2 (two) times daily with a meal.  , Disp: , Rfl: ;  cetirizine (ZYRTEC) 10 MG tablet, Take 10 mg by mouth daily.  , Disp: , Rfl: ;  citalopram (CELEXA) 20 MG tablet, Take 1 tablet (20 mg total) by mouth daily., Disp: 100 tablet, Rfl: 3;  fish oil-omega-3 fatty acids 1000 MG capsule, Take 1 g by mouth daily.  , Disp: , Rfl:  Fluticasone-Salmeterol (ADVAIR DISKUS) 250-50 MCG/DOSE AEPB, Inhale 1 puff into the lungs 2 (two) times daily., Disp: 60 each, Rfl: 0;  HYDROcodone-acetaminophen (VICODIN) 5-500 MG per tablet, Take 1 tablet by mouth every 8 (eight) hours as needed. , Disp: , Rfl: ;  Multiple Vitamin (MULTIVITAMIN) tablet, Take 1 tablet by mouth daily.  , Disp: , Rfl:  simvastatin (ZOCOR) 20 MG tablet, Take 1 tablet (20 mg total) by mouth at bedtime., Disp: 100 tablet, Rfl: 3  EXAM:  Filed Vitals:   10/21/12 1400  BP: 124/80  Pulse: 96  Temp: 98.3 F (36.8 C)    Body mass index is 33.67 kg/(m^2).  GENERAL: vitals reviewed and listed above, alert, oriented, appears well hydrated and in no acute distress  HEENT: atraumatic, normal optho exam, conjunttiva  clear, no obvious abnormalities on inspection of external nose and ears  NECK: no obvious masses on inspection, no meningeal signs, normal ROM  LUNGS: clear to auscultation bilaterally, no wheezes, rales or rhonchi, good air movement  CV: HRRR, no peripheral edema  MS: moves all extremities without noticeable abnormality  NEURO: PERRLA, CN II-XII grossly intact, normal finger to nose  PSYCH: pleasant and cooperative, no obvious depression or anxiety  ASSESSMENT AND PLAN:  Discussed the following assessment and plan:  Migraine  -similar to previous migraines, long hx of migraines - reports toradol and phenergan work to abort her migraines -risks discussed and IM toradol and phenergan given -husband driving -return precuations -Patient advised to return or notify a doctor immediately if symptoms  worsen or persist or new concerns arise.  There are no Patient Instructions on file for this visit.   [name].

## 2012-10-30 ENCOUNTER — Other Ambulatory Visit: Payer: Self-pay | Admitting: Family Medicine

## 2012-11-02 ENCOUNTER — Other Ambulatory Visit (HOSPITAL_COMMUNITY): Payer: Self-pay | Admitting: Obstetrics and Gynecology

## 2012-11-02 DIAGNOSIS — Z1231 Encounter for screening mammogram for malignant neoplasm of breast: Secondary | ICD-10-CM

## 2012-11-09 ENCOUNTER — Ambulatory Visit (HOSPITAL_COMMUNITY)
Admission: RE | Admit: 2012-11-09 | Discharge: 2012-11-09 | Disposition: A | Discharge status: Home / Self Care | Payer: BC Managed Care – PPO | Source: Ambulatory Visit | Attending: Obstetrics and Gynecology | Admitting: Obstetrics and Gynecology

## 2012-11-09 DIAGNOSIS — Z1231 Encounter for screening mammogram for malignant neoplasm of breast: Secondary | ICD-10-CM | POA: Insufficient documentation

## 2012-12-28 ENCOUNTER — Emergency Department (HOSPITAL_COMMUNITY)
Admission: EM | Admit: 2012-12-28 | Discharge: 2012-12-28 | Disposition: A | Discharge status: Home / Self Care | Payer: BC Managed Care – PPO | Attending: Emergency Medicine | Admitting: Emergency Medicine

## 2012-12-28 ENCOUNTER — Encounter (HOSPITAL_COMMUNITY): Payer: Self-pay | Admitting: Emergency Medicine

## 2012-12-28 ENCOUNTER — Encounter: Payer: Self-pay | Admitting: Family Medicine

## 2012-12-28 ENCOUNTER — Ambulatory Visit (INDEPENDENT_AMBULATORY_CARE_PROVIDER_SITE_OTHER): Payer: BC Managed Care – PPO | Admitting: Family Medicine

## 2012-12-28 ENCOUNTER — Emergency Department (HOSPITAL_COMMUNITY): Payer: BC Managed Care – PPO

## 2012-12-28 VITALS — BP 110/80 | Temp 98.5°F | Wt 212.0 lb

## 2012-12-28 DIAGNOSIS — E785 Hyperlipidemia, unspecified: Secondary | ICD-10-CM | POA: Insufficient documentation

## 2012-12-28 DIAGNOSIS — Z9089 Acquired absence of other organs: Secondary | ICD-10-CM | POA: Insufficient documentation

## 2012-12-28 DIAGNOSIS — E669 Obesity, unspecified: Secondary | ICD-10-CM | POA: Insufficient documentation

## 2012-12-28 DIAGNOSIS — R52 Pain, unspecified: Secondary | ICD-10-CM

## 2012-12-28 DIAGNOSIS — R197 Diarrhea, unspecified: Secondary | ICD-10-CM

## 2012-12-28 DIAGNOSIS — Z90710 Acquired absence of both cervix and uterus: Secondary | ICD-10-CM | POA: Insufficient documentation

## 2012-12-28 DIAGNOSIS — Z862 Personal history of diseases of the blood and blood-forming organs and certain disorders involving the immune mechanism: Secondary | ICD-10-CM | POA: Insufficient documentation

## 2012-12-28 DIAGNOSIS — R110 Nausea: Secondary | ICD-10-CM | POA: Insufficient documentation

## 2012-12-28 DIAGNOSIS — I10 Essential (primary) hypertension: Secondary | ICD-10-CM | POA: Insufficient documentation

## 2012-12-28 DIAGNOSIS — F329 Major depressive disorder, single episode, unspecified: Secondary | ICD-10-CM | POA: Insufficient documentation

## 2012-12-28 DIAGNOSIS — F411 Generalized anxiety disorder: Secondary | ICD-10-CM | POA: Insufficient documentation

## 2012-12-28 DIAGNOSIS — R109 Unspecified abdominal pain: Secondary | ICD-10-CM

## 2012-12-28 DIAGNOSIS — Z79899 Other long term (current) drug therapy: Secondary | ICD-10-CM | POA: Insufficient documentation

## 2012-12-28 DIAGNOSIS — F3289 Other specified depressive episodes: Secondary | ICD-10-CM | POA: Insufficient documentation

## 2012-12-28 DIAGNOSIS — Z7982 Long term (current) use of aspirin: Secondary | ICD-10-CM | POA: Insufficient documentation

## 2012-12-28 LAB — URINALYSIS, ROUTINE W REFLEX MICROSCOPIC
Hgb urine dipstick: NEGATIVE
Leukocytes, UA: NEGATIVE
Nitrite: NEGATIVE
Specific Gravity, Urine: 1.027 (ref 1.005–1.030)
Urobilinogen, UA: 1 mg/dL (ref 0.0–1.0)

## 2012-12-28 LAB — COMPREHENSIVE METABOLIC PANEL
ALT: 15 U/L (ref 0–35)
AST: 17 U/L (ref 0–37)
Albumin: 3.5 g/dL (ref 3.5–5.2)
Calcium: 8.7 mg/dL (ref 8.4–10.5)
Sodium: 139 mEq/L (ref 135–145)
Total Protein: 7 g/dL (ref 6.0–8.3)

## 2012-12-28 LAB — CBC WITH DIFFERENTIAL/PLATELET
Basophils Relative: 0 % (ref 0–1)
Eosinophils Absolute: 0.1 10*3/uL (ref 0.0–0.7)
HCT: 43 % (ref 36.0–46.0)
Hemoglobin: 14.8 g/dL (ref 12.0–15.0)
Lymphs Abs: 1.3 10*3/uL (ref 0.7–4.0)
MCH: 30.8 pg (ref 26.0–34.0)
MCHC: 34.4 g/dL (ref 30.0–36.0)
Monocytes Absolute: 1 10*3/uL (ref 0.1–1.0)
Monocytes Relative: 13 % — ABNORMAL HIGH (ref 3–12)

## 2012-12-28 LAB — LIPASE, BLOOD: Lipase: 15 U/L (ref 11–59)

## 2012-12-28 MED ORDER — IOHEXOL 300 MG/ML  SOLN
100.0000 mL | Freq: Once | INTRAMUSCULAR | Status: AC | PRN
  Administered 2012-12-28: 100 mL via INTRAVENOUS

## 2012-12-28 MED ORDER — ONDANSETRON HCL 4 MG/2ML IJ SOLN
4.0000 mg | Freq: Once | INTRAMUSCULAR | Status: AC
  Administered 2012-12-28: 4 mg via INTRAVENOUS
  Filled 2012-12-28: qty 2

## 2012-12-28 MED ORDER — IOHEXOL 300 MG/ML  SOLN
50.0000 mL | Freq: Once | INTRAMUSCULAR | Status: AC | PRN
  Administered 2012-12-28: 50 mL via ORAL

## 2012-12-28 MED ORDER — SODIUM CHLORIDE 0.9 % IV SOLN: INTRAVENOUS | Status: DC

## 2012-12-28 MED ORDER — SODIUM CHLORIDE 0.9 % IV BOLUS (SEPSIS)
1000.0000 mL | Freq: Once | INTRAVENOUS | Status: AC
  Administered 2012-12-28: 1000 mL via INTRAVENOUS

## 2012-12-28 NOTE — Progress Notes (Signed)
  Subjective:    Patient ID: Rachel Gates, female    DOB: 09-01-1955, 57 y.o.   MRN: [id number]  HPI Rachel Gates is a 57 year old married female nonsmoker who comes in with a 58 hour history of abdominal pain and nausea vomiting and diarrhea  She states that the symptoms started on Sunday. The last episode of vomiting was yesterday. Today she's had about 8 loose bowel movements. She continues to have fever and chills.  No contact with anybody with recently with a viral gastroenteritis  She's had her gallbladder removed abdominal hysterectomy. Colonoscopy was 2007 she recalls that was normal  She's had no urinary tract symptoms indeed she states that she is urinating normally.   Review of Systems Review of systems otherwise negative    Objective:   Physical Exam Well-developed and nourished female in acute distress  Examination the abdomen shows the abdomen B. Soft somewhat distended. Bowel sounds are hypoactive. Diffuse tenderness in all 4 quadrants positive for rebound       Assessment & Plan:  Acute abdominal pain with diffuse rebound tenderness to the emergency room stat

## 2012-12-28 NOTE — ED Notes (Signed)
N/v/d lower abd pain since sunday

## 2012-12-28 NOTE — ED Notes (Signed)
Pt presents with abdominal tenderness associated with N/V/D that began Sunday.  States she has not been able to keep food or fluids down without vomiting or having diarrhea.  Tenderness to abdomen upon palpation and release of palpation.  Pt was seen by PCP (Dr. Tawanna Cooler) for symptoms and instructed to come to ED for further evaluation.

## 2012-12-28 NOTE — ED Provider Notes (Signed)
History     CSN: [id number]  Arrival date & time 12/28/12  1344   First MD Initiated Contact with Patient 12/28/12 1717      Chief Complaint  Patient presents with  . Abdominal Pain    (Consider location/radiation/quality/duration/timing/severity/associated sxs/prior treatment) Patient is a 57 y.o. female presenting with abdominal pain. The history is provided by the patient.  Abdominal Pain Associated symptoms include abdominal pain.   patient here complaining of diffuse abdominal pain x2 days associated with diarrhea and nausea. No vomiting  reported. Was seen by her Dr. today and was sent here for a abdominal CT. She has taken Imodium which has helped her diarrhea somewhat. Denies any blood in her stool. No prior history of this. Denies any recent trauma history or recent antibiotic use. No recent sick exposures  Past Medical History  Diagnosis Date  . MHA (microangiopathic hemolytic anemia)   . Allergy   . Hyperlipidemia   . Anxiety   . Depression   . Hypertension   . Obese     Past Surgical History  Procedure Laterality Date  . Abdominal hysterectomy    . Cholecystectomy      Family History  Problem Relation Age of Onset  . Hypertension Mother   . Cancer Mother     uterine  . Angina Mother   . Heart attack Father   . Glaucoma Father     History  Substance Use Topics  . Smoking status: Never Smoker   . Smokeless tobacco: Never Used  . Alcohol Use: No    OB History   Grav Para Term Preterm Abortions TAB SAB Ect Mult Living                  Review of Systems  Gastrointestinal: Positive for abdominal pain.  All other systems reviewed and are negative.    Allergies  Review of patient's allergies indicates no known allergies.  Home Medications   Current Outpatient Rx  Name  Route  Sig  Dispense  Refill  . aspirin 81 MG EC tablet   Oral   Take 81 mg by mouth daily.           Marland Kitchen atenolol (TENORMIN) 50 MG tablet   Oral   Take 50 mg by mouth  daily.         . calcium carbonate (OS-CAL) 600 MG TABS   Oral   Take 600 mg by mouth 2 (two) times daily with a meal.           . cetirizine (ZYRTEC) 10 MG tablet   Oral   Take 10 mg by mouth daily.           . citalopram (CELEXA) 10 MG tablet   Oral   Take 10 mg by mouth daily.         . fish oil-omega-3 fatty acids 1000 MG capsule   Oral   Take 1 g by mouth daily.           Marland Kitchen HYDROcodone-acetaminophen (VICODIN) 5-500 MG per tablet   Oral   Take 1 tablet by mouth every 8 (eight) hours as needed for pain.          . Multiple Vitamin (MULTIVITAMIN) tablet   Oral   Take 1 tablet by mouth daily.           . simvastatin (ZOCOR) 20 MG tablet   Oral   Take 1 tablet (20 mg total) by mouth at bedtime.  100 tablet   3   . albuterol (PROVENTIL HFA;VENTOLIN HFA) 108 (90 BASE) MCG/ACT inhaler   Inhalation   Inhale 2 puffs into the lungs every 4 (four) hours as needed for wheezing.   1 Inhaler   0     BP 124/73  Pulse 74  Temp(Src) 98.5 F (36.9 C) (Oral)  Resp 18  Wt 211 lb (95.709 kg)  BMI 33.04 kg/m2  SpO2 99%  Physical Exam  Nursing note and vitals reviewed. Constitutional: She is oriented to person, place, and time. She appears well-developed and well-nourished.  Non-toxic appearance. No distress.  HENT:  Head: Normocephalic and atraumatic.  Eyes: Conjunctivae, EOM and lids are normal. Pupils are equal, round, and reactive to light.  Neck: Normal range of motion. Neck supple. No tracheal deviation present. No mass present.  Cardiovascular: Normal rate, regular rhythm and normal heart sounds.  Exam reveals no gallop.   No murmur heard. Pulmonary/Chest: Effort normal and breath sounds normal. No stridor. No respiratory distress. She has no decreased breath sounds. She has no wheezes. She has no rhonchi. She has no rales.  Abdominal: Soft. Normal appearance and bowel sounds are normal. She exhibits no distension. There is generalized tenderness. There  is no rigidity, no rebound, no guarding and no CVA tenderness.  Musculoskeletal: Normal range of motion. She exhibits no edema and no tenderness.  Neurological: She is alert and oriented to person, place, and time. She has normal strength. No cranial nerve deficit or sensory deficit. GCS eye subscore is 4. GCS verbal subscore is 5. GCS motor subscore is 6.  Skin: Skin is warm and dry. No abrasion and no rash noted.  Psychiatric: She has a normal mood and affect. Her speech is normal and behavior is normal.    ED Course  Procedures (including critical care time)  Labs Reviewed  URINALYSIS, ROUTINE W REFLEX MICROSCOPIC - Abnormal; Notable for the following:    APPearance CLOUDY (*)    All other components within normal limits  COMPREHENSIVE METABOLIC PANEL - Abnormal; Notable for the following:    Potassium 3.3 (*)    Glucose, Bld 112 (*)    All other components within normal limits  CBC WITH DIFFERENTIAL - Abnormal; Notable for the following:    Monocytes Relative 13 (*)    All other components within normal limits  LIPASE, BLOOD   No results found.   No diagnosis found.    MDM  Patient given IV fluids here and she feels better. Blood pressure has been repeated and is stable. Has medications for diarrhea and will follow with her Dr.        Toy Baker, MD 12/28/12 2108

## 2012-12-28 NOTE — Patient Instructions (Signed)
Go directly to the emergency room your husband will drive him

## 2012-12-30 ENCOUNTER — Telehealth: Payer: Self-pay | Admitting: Family Medicine

## 2012-12-30 NOTE — Telephone Encounter (Signed)
PT husband called to request a work not for pt from the last 3 days. He stated that he would like to pick it up this afternoon. Please assist.

## 2012-12-30 NOTE — Telephone Encounter (Signed)
Letter ready for pick up and husband is aware

## 2013-04-13 ENCOUNTER — Other Ambulatory Visit: Payer: BC Managed Care – PPO

## 2013-04-18 ENCOUNTER — Encounter: Payer: BC Managed Care – PPO | Admitting: Family Medicine

## 2013-05-05 ENCOUNTER — Encounter: Payer: Self-pay | Admitting: Family Medicine

## 2013-05-05 ENCOUNTER — Ambulatory Visit (INDEPENDENT_AMBULATORY_CARE_PROVIDER_SITE_OTHER): Payer: BC Managed Care – PPO | Admitting: Family Medicine

## 2013-05-05 VITALS — BP 120/76 | Temp 98.7°F | Ht 67.25 in | Wt 216.0 lb

## 2013-05-05 DIAGNOSIS — F3289 Other specified depressive episodes: Secondary | ICD-10-CM

## 2013-05-05 DIAGNOSIS — I10 Essential (primary) hypertension: Secondary | ICD-10-CM

## 2013-05-05 DIAGNOSIS — F329 Major depressive disorder, single episode, unspecified: Secondary | ICD-10-CM

## 2013-05-05 DIAGNOSIS — E663 Overweight: Secondary | ICD-10-CM

## 2013-05-05 DIAGNOSIS — E785 Hyperlipidemia, unspecified: Secondary | ICD-10-CM

## 2013-05-05 DIAGNOSIS — Z0000 Encounter for general adult medical examination without abnormal findings: Secondary | ICD-10-CM

## 2013-05-05 DIAGNOSIS — Z23 Encounter for immunization: Secondary | ICD-10-CM

## 2013-05-05 MED ORDER — CITALOPRAM HYDROBROMIDE 10 MG PO TABS: 10.0000 mg | ORAL_TABLET | Freq: Every day | ORAL | Status: DC

## 2013-05-05 MED ORDER — SIMVASTATIN 20 MG PO TABS: 20.0000 mg | ORAL_TABLET | Freq: Every day | ORAL | Status: DC

## 2013-05-05 MED ORDER — ATENOLOL 50 MG PO TABS: 50.0000 mg | ORAL_TABLET | Freq: Every day | ORAL | Status: DC

## 2013-05-05 NOTE — Progress Notes (Signed)
Chief Complaint  Patient presents with  . Annual Exam    HPI:  Rachel Gates is a 58 yo F pt of Dr. Tawanna Gates here for CPE:  -Concerns and chronic issues:  1-2)HLD/HTN: -takes simvastatin 20 mg and ASA daily and atenolol 50 -denies: CP, SOB, DOE, swelling -had labs through work on 04/27/13 which we reviewed and LDL 94, HDL 34, total 161, trig 164 CMP ok, TH ok, CBC ok  3)Depression: -on celexa 10mg  daily -stable mood  -Diet: variety of foods, balance and well rounded  -Calcium and Vit D: no  -Exercise: no regular exercise  -Diabetes and Dyslipidemia Screening: just had done at work and brought today  -Hx of HTN: yes, treated  -Vaccines: getting flu vaccine today  -pap history: still gets paps from Dr. Huntley Gates even though had hysterectomy, had in July 2014  -FDLMP: N/A s/p hysterectomy for fibroids  -sexual activity: yes, female partner, no new partners  -wants STI testing or hep C testing: no  -FH breast, colon or ovarian ca: see FH -UTD on mammo and colonoscopy -Alcohol, Tobacco, drug use: see social history  Review of Systems - Review of Systems  Constitutional: Negative for weight loss and malaise/fatigue.  HENT: Negative for hearing loss.   Eyes: Negative for blurred vision and double vision.  Respiratory: Negative for shortness of breath.   Cardiovascular: Negative for chest pain, palpitations and leg swelling.  Gastrointestinal: Negative for diarrhea, constipation, blood in stool and melena.  Genitourinary: Negative for dysuria and urgency.  Musculoskeletal: Negative for joint pain and falls.  Skin: Negative for rash.  Neurological: Negative for dizziness and seizures.  Endo/Heme/Allergies: Does not bruise/bleed easily.  Psychiatric/Behavioral: Negative for depression and memory loss. The patient is not nervous/anxious.      Past Medical History  Diagnosis Date  . MHA (microangiopathic hemolytic anemia)   . Allergy   . Hyperlipidemia   . Anxiety   .  Depression   . Hypertension   . Obese     Past Surgical History  Procedure Laterality Date  . Abdominal hysterectomy    . Cholecystectomy      Family History  Problem Relation Age of Onset  . Hypertension Mother   . Cancer Mother     uterine  . Angina Mother   . Heart attack Father   . Glaucoma Father     History   Social History  . Marital Status: Married    Spouse Name: N/A    Number of Children: N/A  . Years of Education: N/A   Social History Main Topics  . Smoking status: Never Smoker   . Smokeless tobacco: Never Used  . Alcohol Use: No  . Drug Use: No  . Sexual Activity: None   Other Topics Concern  . None   Social History Narrative  . None    Current outpatient prescriptions:albuterol (PROVENTIL HFA;VENTOLIN HFA) 108 (90 BASE) MCG/ACT inhaler, Inhale 2 puffs into the lungs every 4 (four) hours as needed for wheezing., Disp: 1 Inhaler, Rfl: 0;  aspirin 81 MG EC tablet, Take 81 mg by mouth daily.  , Disp: , Rfl: ;  atenolol (TENORMIN) 50 MG tablet, Take 1 tablet (50 mg total) by mouth daily., Disp: 90 tablet, Rfl: 3 calcium carbonate (OS-CAL) 600 MG TABS, Take 600 mg by mouth 2 (two) times daily with a meal.  , Disp: , Rfl: ;  cetirizine (ZYRTEC) 10 MG tablet, Take 10 mg by mouth daily.  , Disp: , Rfl: ;  citalopram (CELEXA) 10 MG tablet, Take 1 tablet (10 mg total) by mouth daily., Disp: 90 tablet, Rfl: 3;  fish oil-omega-3 fatty acids 1000 MG capsule, Take 1 g by mouth daily.  , Disp: , Rfl:  HYDROcodone-acetaminophen (VICODIN) 5-500 MG per tablet, Take 1 tablet by mouth every 8 (eight) hours as needed for pain. , Disp: , Rfl: ;  Multiple Vitamin (MULTIVITAMIN) tablet, Take 1 tablet by mouth daily.  , Disp: , Rfl: ;  simvastatin (ZOCOR) 20 MG tablet, Take 1 tablet (20 mg total) by mouth at bedtime., Disp: 100 tablet, Rfl: 3;  Vitamin D, Ergocalciferol, (DRISDOL) 50000 UNITS CAPS capsule, , Disp: , Rfl:   EXAM:  Filed Vitals:   05/05/13 1630  BP: 120/76  Temp:  98.7 F (37.1 C)    GENERAL: vitals reviewed and listed below, alert, oriented, appears well hydrated and in no acute distress  HEENT: head atraumatic, PERRLA, normal appearance of eyes, ears, nose and mouth. moist mucus membranes.  NECK: supple, scar on R neck next to small mass  LUNGS: clear to auscultation bilaterally, no rales, rhonchi or wheeze  CV: HRRR, no peripheral edema or cyanosis, normal pedal pulses  BREAST: deferred - done by gyn  ABDOMEN: bowel sounds normal, soft, non tender to palpation, no masses, no rebound or guarding  GU: deferred - done by gyn  RECTAL: refused  SKIN: no rash or abnormal lesions  MS: normal gait, moves all extremities normally  NEURO:  normal muscle strength and sensation to light touch on extremities  PSYCH: normal affect, pleasant and cooperative  ASSESSMENT AND PLAN:  Discussed the following assessment and plan:  Visit for preventive health examination  HYPERLIPIDEMIA - Plan: simvastatin (ZOCOR) 20 MG tablet  OVERWEIGHT  DEPRESSION - Plan: citalopram (CELEXA) 10 MG tablet  HYPERTENSION - Plan: atenolol (TENORMIN) 50 MG tablet   -Discussed and advised all [place] preventive services health task force level A and B recommendations for age, sex and risks.  -Advised at least 150 minutes of exercise per week and a healthy diet low in saturated fats and sweets and consisting of fresh fruits and vegetables, lean meats such as fish and white chicken and whole grains.  -reviewed labs, provided refills  -flu and tdap vaccines given  -she is seeing Dr. Ezzard Gates in ENT for mass in neck and per her report had neg biopsy - advised her to make sure to complete any follow up advised  -labs, studies and vaccines per orders this encounter  No orders of the defined types were placed in this encounter.    There are no Patient Instructions on file for this visit.  Patient advised to return to clinic immediately if symptoms worsen or persist  or new concerns.  @LIFEPLAN @  No Follow-up on file.  [name].

## 2013-05-05 NOTE — Addendum Note (Signed)
Addended by: Azucena Freed on: 05/05/2013 04:54 PM   Modules accepted: Orders

## 2013-08-25 ENCOUNTER — Ambulatory Visit (INDEPENDENT_AMBULATORY_CARE_PROVIDER_SITE_OTHER): Payer: BC Managed Care – PPO | Admitting: Family Medicine

## 2013-08-25 ENCOUNTER — Encounter: Payer: Self-pay | Admitting: *Deleted

## 2013-08-25 ENCOUNTER — Encounter: Payer: Self-pay | Admitting: Family Medicine

## 2013-08-25 VITALS — BP 120/80 | Temp 98.1°F | Wt 217.0 lb

## 2013-08-25 DIAGNOSIS — G43009 Migraine without aura, not intractable, without status migrainosus: Secondary | ICD-10-CM

## 2013-08-25 MED ORDER — PREDNISONE 20 MG PO TABS: ORAL_TABLET | ORAL | Status: DC

## 2013-08-25 MED ORDER — HYDROCODONE-ACETAMINOPHEN 5-500 MG PO TABS
1.0000 | ORAL_TABLET | Freq: Three times a day (TID) | ORAL | Status: DC | PRN
Start: 2013-08-25 — End: 2013-08-26

## 2013-08-25 MED ORDER — KETOROLAC TROMETHAMINE 60 MG/2ML IM SOLN
60.0000 mg | Freq: Once | INTRAMUSCULAR | Status: AC
  Administered 2013-08-25: 60 mg via INTRAMUSCULAR

## 2013-08-25 MED ORDER — RIZATRIPTAN BENZOATE 5 MG PO TBDP: 5.0000 mg | ORAL_TABLET | ORAL | Status: DC | PRN

## 2013-08-25 NOTE — Progress Notes (Signed)
   Subjective:    Patient ID: Rachel Gates, female    DOB: October 18, 1955, 58 y.o.   MRN: [id number]  HPI Rachel Gates is a 58 year old female who comes in today for treatment of a migraine headache  She's had migraines in the past has always been able to stop him with over-the-counter medication  On January 13 she woke up at 1:30 AM with a migraine. She tried the Tylenol products that didn't help. She didn't have to take a Vicodin. In the headache now is an 8 on a scale of 1-10 and it's constant. No nausea vomiting she does have photophobia   Review of Systems Review of systems otherwise negative    Objective:   Physical Exam  Well-developed well-nourished female no acute distress vital signs stable she is afebrile  Weight down 38 pounds dear diet and exercise!!!!!!!!!!!      Assessment & Plan:  Migraine headache intractable,,,,,,,,, prednisone burst and taper

## 2013-08-25 NOTE — Patient Instructions (Signed)
Prednisone 20 mg,,,,,,,,, 2 tabs daily until headache is gone and then taper as outlined  In the future try Maxalt 5 mg,,,,,,,,,,, 1 immediately at the first sign of the onset of the headache

## 2013-08-25 NOTE — Progress Notes (Signed)
Pre visit review using our clinic review tool, if applicable. No additional management support is needed unless otherwise documented below in the visit note. 

## 2013-08-26 ENCOUNTER — Telehealth: Payer: Self-pay | Admitting: Family Medicine

## 2013-08-26 MED ORDER — HYDROCODONE-ACETAMINOPHEN 5-325 MG PO TABS: 1.0000 | ORAL_TABLET | Freq: Three times a day (TID) | ORAL | Status: DC | PRN

## 2013-08-26 NOTE — Telephone Encounter (Signed)
Pt's husband calling on behalf of pt to report that HYDROcodone-acetaminophen (VICODIN) 5-500 MG per tablet is no longer available per pharamacy.  They need a new script for HYDROcodone-acetaminophen (VICODIN) 300mg .

## 2013-08-26 NOTE — Telephone Encounter (Signed)
Rx ready for pick up and husband is aware

## 2013-11-08 ENCOUNTER — Other Ambulatory Visit (HOSPITAL_COMMUNITY): Payer: Self-pay | Admitting: Obstetrics and Gynecology

## 2013-11-08 DIAGNOSIS — Z1231 Encounter for screening mammogram for malignant neoplasm of breast: Secondary | ICD-10-CM

## 2013-11-15 ENCOUNTER — Ambulatory Visit (HOSPITAL_COMMUNITY)
Admission: RE | Admit: 2013-11-15 | Discharge: 2013-11-15 | Disposition: A | Discharge status: Home / Self Care | Payer: BC Managed Care – PPO | Source: Ambulatory Visit | Attending: Obstetrics and Gynecology | Admitting: Obstetrics and Gynecology

## 2013-11-15 DIAGNOSIS — Z1231 Encounter for screening mammogram for malignant neoplasm of breast: Secondary | ICD-10-CM | POA: Insufficient documentation

## 2014-01-11 ENCOUNTER — Encounter: Payer: Self-pay | Admitting: Family Medicine

## 2014-01-11 ENCOUNTER — Ambulatory Visit (INDEPENDENT_AMBULATORY_CARE_PROVIDER_SITE_OTHER): Payer: BC Managed Care – PPO | Admitting: Family Medicine

## 2014-01-11 VITALS — BP 110/80 | Temp 98.7°F

## 2014-01-11 DIAGNOSIS — G43009 Migraine without aura, not intractable, without status migrainosus: Secondary | ICD-10-CM

## 2014-01-11 MED ORDER — KETOROLAC TROMETHAMINE 60 MG/2ML IM SOLN
60.0000 mg | Freq: Once | INTRAMUSCULAR | Status: AC
  Administered 2014-01-11: 60 mg via INTRAMUSCULAR

## 2014-01-11 MED ORDER — PROMETHAZINE HCL 25 MG/ML IJ SOLN
25.0000 mg | INTRAMUSCULAR | Status: AC
  Administered 2014-01-11: 25 mg via INTRAMUSCULAR

## 2014-01-11 MED ORDER — ONDANSETRON HCL 4 MG/5ML PO SOLN: 4.0000 mg | Freq: Once | ORAL | Status: DC

## 2014-01-11 NOTE — Patient Instructions (Signed)
In the future at the onset of migraine take the teaspoon of Zofran in the Maxalt sublingual to see if this will abort the migraine

## 2014-01-11 NOTE — Addendum Note (Signed)
Addended by: Westley Hummer B on: 01/11/2014 11:57 AM   Modules accepted: Orders

## 2014-01-11 NOTE — Progress Notes (Signed)
   Subjective:    Patient ID: Rachel Gates, female    DOB: 1956-05-02, 58 y.o.   MRN: [id number]  HPI Rachel Gates is a 58 year old married female nonsmoker who comes in today accompanied by her husband because of a severe migraine headache unresponsive to Maxalt  We have tried different antimigraine medicines none of which seem to work. We switched to the dissolvable Maxalt however she had a migraine today within 5 minutes at the [place] and it did work because she started throwing up.   Review of Systems Review of systems otherwise negative    Objective:   Physical Exam  Well-developed well-nourished female no acute distress vital signs stable she is afebrile in a dark room because of photophobia      Assessment & Plan:  Migraine headache,,,,,,,,,, Toradol and Phenergan,,,,, bed rest at home return when necessary,

## 2014-01-11 NOTE — Progress Notes (Signed)
Pre visit review using our clinic review tool, if applicable. No additional management support is needed unless otherwise documented below in the visit note. 

## 2014-04-15 ENCOUNTER — Ambulatory Visit (INDEPENDENT_AMBULATORY_CARE_PROVIDER_SITE_OTHER): Payer: BC Managed Care – PPO | Admitting: Physician Assistant

## 2014-04-15 VITALS — BP 122/72 | HR 70 | Temp 97.8°F | Resp 16 | Ht 67.5 in | Wt 222.6 lb

## 2014-04-15 DIAGNOSIS — H1089 Other conjunctivitis: Secondary | ICD-10-CM

## 2014-04-15 MED ORDER — DICLOFENAC SODIUM 0.1 % OP SOLN: 1.0000 [drp] | Freq: Four times a day (QID) | OPHTHALMIC | Status: DC

## 2014-04-15 NOTE — Progress Notes (Signed)
   Subjective:    Patient ID: Rachel Gates, female    DOB: 1956-01-27, 58 y.o.   MRN: [id number]   PCP: Joycelyn Man, MD  Chief Complaint  Patient presents with  . Eye Pain    Both - matted,crusty,swollen x 2 1/2 wks   Medications, allergies, past medical history, surgical history, family history, social history and problem list reviewed and updated.  HPI  This 58 y.o. female presents for evaluation of 2.5 weeks of itchy, crusting eyes.  No vision change, but she describes a "film" over her eyes in the mornings.  Her eyelids are mildly red and swollen, she thinks from all the rubbing she's doing.  Wears glasses, no contact lenses. No history of FB to eye or possible debris to the eye. Drainage during the day is clear.  No  Purulent drainage at all.  No fever/chills.  She started using Patanol, previously prescribed by her eye specialist for allergic eye symptoms, which are usually soothing to her.  This time they burn.   Review of Systems As above.    Objective:   Physical Exam  Constitutional: She is oriented to person, place, and time. She appears well-developed and well-nourished. No distress.  BP 122/72  Pulse 70  Temp(Src) 97.8 F (36.6 C) (Oral)  Resp 16  Ht 5' 7.5" (1.715 m)  Wt 222 lb 9.6 oz (100.971 kg)  BMI 34.33 kg/m2  SpO2 97%   HENT:  Head: Normocephalic and atraumatic.  Eyes: EOM are normal. Pupils are equal, round, and reactive to light. Right eye exhibits no discharge, no exudate and no hordeolum. No foreign body present in the right eye. Left eye exhibits no discharge, no exudate and no hordeolum. No foreign body present in the left eye. Right conjunctiva is injected (mildly). Right conjunctiva has no hemorrhage. Left conjunctiva is injected (mildly). Left conjunctiva has no hemorrhage. No scleral icterus.  Fundoscopic exam:      The right eye shows no hemorrhage and no papilledema. The right eye shows red reflex.       The left eye shows no  hemorrhage and no papilledema. The left eye shows red reflex.  Lids are mildly erythematous, mildly swollen. Scant crusted material dried on the lower lids bilaterally.  Pulmonary/Chest: Effort normal.  Neurological: She is alert and oriented to person, place, and time.  Psychiatric: She has a normal mood and affect. Her speech is normal and behavior is normal.          Assessment & Plan:  1. Other conjunctivitis Temporarily hold Zyrtec and Patanol. OTC Systane drops and Rx diclofenac drops. Oral ibuprofen.  If symptoms not dramatically improved in 2-3 days, follow-up with eye specialist. - diclofenac (VOLTAREN) 0.1 % ophthalmic solution; Place 1 drop into both eyes 4 (four) times daily.  Dispense: 5 mL; Refill: 0   Fara Chute, PA-C Physician Assistant-Certified [facility]

## 2014-04-15 NOTE — Patient Instructions (Signed)
Hold the Zyrtec for a few days if you can tolerate that. Use re-wetting drops (I like Systane) throughout the day, and during the night if needed. Use oral ibuprofen as needed.  If your symptoms are not considerably better in 2-3 days, please follow-up with your eye specialist. If your symptoms worsen before you can get in with your eye specialist, please return here.

## 2014-05-08 ENCOUNTER — Encounter: Payer: Self-pay | Admitting: Family Medicine

## 2014-05-08 ENCOUNTER — Ambulatory Visit (INDEPENDENT_AMBULATORY_CARE_PROVIDER_SITE_OTHER): Payer: BC Managed Care – PPO | Admitting: Family Medicine

## 2014-05-08 VITALS — BP 120/80 | Temp 98.4°F | Ht 67.0 in | Wt 222.0 lb

## 2014-05-08 DIAGNOSIS — G43009 Migraine without aura, not intractable, without status migrainosus: Secondary | ICD-10-CM

## 2014-05-08 DIAGNOSIS — F329 Major depressive disorder, single episode, unspecified: Secondary | ICD-10-CM

## 2014-05-08 DIAGNOSIS — E785 Hyperlipidemia, unspecified: Secondary | ICD-10-CM

## 2014-05-08 DIAGNOSIS — Z23 Encounter for immunization: Secondary | ICD-10-CM

## 2014-05-08 DIAGNOSIS — I10 Essential (primary) hypertension: Secondary | ICD-10-CM

## 2014-05-08 DIAGNOSIS — F3289 Other specified depressive episodes: Secondary | ICD-10-CM

## 2014-05-08 DIAGNOSIS — E663 Overweight: Secondary | ICD-10-CM

## 2014-05-08 MED ORDER — RIZATRIPTAN BENZOATE 5 MG PO TBDP: 5.0000 mg | ORAL_TABLET | ORAL | Status: DC | PRN

## 2014-05-08 MED ORDER — HYDROCODONE-ACETAMINOPHEN 5-325 MG PO TABS
1.0000 | ORAL_TABLET | Freq: Three times a day (TID) | ORAL | Status: DC | PRN
Start: 2014-05-08 — End: 2016-05-12

## 2014-05-08 MED ORDER — SIMVASTATIN 20 MG PO TABS: 20.0000 mg | ORAL_TABLET | Freq: Every day | ORAL | Status: DC

## 2014-05-08 MED ORDER — CITALOPRAM HYDROBROMIDE 10 MG PO TABS: 10.0000 mg | ORAL_TABLET | Freq: Every day | ORAL | Status: DC

## 2014-05-08 MED ORDER — ATENOLOL 50 MG PO TABS: 50.0000 mg | ORAL_TABLET | Freq: Every day | ORAL | Status: DC

## 2014-05-08 NOTE — Progress Notes (Signed)
Pre visit review using our clinic review tool, if applicable. No additional management support is needed unless otherwise documented below in the visit note. 

## 2014-05-08 NOTE — Patient Instructions (Signed)
Continue the diet and exercise program  Continue current medications  Followup in 1 year sooner if any problems

## 2014-05-08 NOTE — Progress Notes (Signed)
   Subjective:    Patient ID: Rachel Gates, female    DOB: 1956-07-07, 58 y.o.   MRN: [id number]  HPI Rachel Gates is a 58 year old married female nonsmoker who comes in today for general physical examination  Her biggest long-term medical issues been her weight. She went to a diet clinic and lost down to 217 pounds. Her daughter Rachel Gates is getting married this Saturday  She takes Tenormin 50 mg daily for hypertension BP 120/80  She takes Celexa 10 mg each bedtime for history of mild depression  She takes Maxalt stat sublingual when necessary for acute migraines and Zofran and Vicodin when necessary if it doesn't work  She takes simvastatin 20 mg daily for hyperlipidemia along with an aspirin tablet. Laboratory data she had done in the spring at the diet clinic was all normal. HDL cholesterol 39 LDL 101   Review of Systems  Constitutional: Negative.   HENT: Negative.   Eyes: Negative.   Respiratory: Negative.   Cardiovascular: Negative.   Gastrointestinal: Negative.   Genitourinary: Negative.   Musculoskeletal: Negative.   Neurological: Negative.   Psychiatric/Behavioral: Negative.        Objective:   Physical Exam  Nursing note and vitals reviewed. Constitutional: She appears well-developed and well-nourished.  HENT:  Head: Normocephalic and atraumatic.  Right Ear: External ear normal.  Left Ear: External ear normal.  Nose: Nose normal.  Mouth/Throat: Oropharynx is clear and moist.  Eyes: EOM are normal. Pupils are equal, round, and reactive to light.  Neck: Normal range of motion. Neck supple. No thyromegaly present.  Cardiovascular: Normal rate, regular rhythm, normal heart sounds and intact distal pulses.  Exam reveals no gallop and no friction rub.   No murmur heard. Pulmonary/Chest: Effort normal and breath sounds normal.  Abdominal: Soft. Bowel sounds are normal. She exhibits no distension and no mass. There is no tenderness. There is no rebound.  Genitourinary:    Bilateral breast exam normal  She had her uterus removed in the 40s for fibroids ovaries were left intact. She continues to get annual Pap smears???????? explained that Pap smears are no longer indicated since she had her uterus and ovaries removed for nonmalignant reasons  Musculoskeletal: Normal range of motion.  Lymphadenopathy:    She has no cervical adenopathy.  Neurological: She is alert. She has normal reflexes. No cranial nerve deficit. She exhibits normal muscle tone. Coordination normal.  Skin: Skin is warm and dry.  Psychiatric: She has a normal mood and affect. Her behavior is normal. Judgment and thought content normal.          Assessment & Plan:  Healthy female  Overweight continue diet exercise and weight loss  Hypertension at goal continue Tenormin 50 mg daily  Mild depression continue Celexa  Hyperlipidemia... Zocor 20 mg daily along with aspirin tablet  Migraine headaches continue Maxalt with backup of Zofran and Vicodin  Status post hysterectomy for nonmalignant reasons,,,,,,,,,,,, Paps and pelvics no longer indicated

## 2014-05-18 ENCOUNTER — Encounter: Payer: Self-pay | Admitting: Family Medicine

## 2014-05-18 ENCOUNTER — Ambulatory Visit (INDEPENDENT_AMBULATORY_CARE_PROVIDER_SITE_OTHER): Payer: BC Managed Care – PPO | Admitting: Family Medicine

## 2014-05-18 VITALS — BP 138/90 | Temp 98.3°F | Wt 223.4 lb

## 2014-05-18 DIAGNOSIS — G43001 Migraine without aura, not intractable, with status migrainosus: Secondary | ICD-10-CM

## 2014-05-18 DIAGNOSIS — R519 Headache, unspecified: Secondary | ICD-10-CM

## 2014-05-18 DIAGNOSIS — R51 Headache: Secondary | ICD-10-CM

## 2014-05-18 MED ORDER — KETOROLAC TROMETHAMINE 60 MG/2ML IM SOLN
60.0000 mg | Freq: Once | INTRAMUSCULAR | Status: AC
  Administered 2014-05-18: 60 mg via INTRAMUSCULAR

## 2014-05-18 NOTE — Progress Notes (Signed)
Pre visit review using our clinic review tool, if applicable. No additional management support is needed unless otherwise documented below in the visit note. 

## 2014-05-18 NOTE — Patient Instructions (Signed)
Rest at home  Vicodin and Zofran when necessary  Hopefully by tomorrow he will feel better and able to go back to work

## 2014-05-18 NOTE — Progress Notes (Signed)
   Subjective:    Patient ID: Rachel Gates, female    DOB: [birth year], 58 y.o.   MRN: [id number]  HPI Rachel Gates is a 58 year old female who comes in today accompanied by her husband because of severe migraine headache unresponsive to oral medications  She has a long-standing history of migraines has Zomig Vicodin and Zofran at home. Yesterday she developed a migraine trigger medicine and seemed to ease off but then today it got worse. She tried taking more medication didn't work. She tried the oral pain medicine and Zofran it didn't work.   Review of Systems Review of systems negative    Objective:   Physical Exam  Well-developed well-nourished female in acute pain with the lights in the room without because of severe photophobia.  Severe migraine headache she was given 60 of Toradol IM and taken home by her husband.      Assessment & Plan:  Severe migraine,,,,,,,,,,,, 60 of Toradol I am,,,,,,,,,, observe x30 minutes,,,,,,,,,,, discharged home with husband on oral meds when necessary,

## 2014-07-18 ENCOUNTER — Ambulatory Visit (INDEPENDENT_AMBULATORY_CARE_PROVIDER_SITE_OTHER): Payer: BC Managed Care – PPO | Admitting: Sports Medicine

## 2014-07-18 VITALS — BP 124/78 | HR 73 | Temp 98.4°F | Resp 16 | Ht 67.5 in | Wt 219.1 lb

## 2014-07-18 DIAGNOSIS — H65191 Other acute nonsuppurative otitis media, right ear: Secondary | ICD-10-CM

## 2014-07-18 MED ORDER — AMOXICILLIN 500 MG PO CAPS: 500.0000 mg | ORAL_CAPSULE | Freq: Two times a day (BID) | ORAL | Status: DC

## 2014-07-18 NOTE — Progress Notes (Signed)
  Rachel Gates - 58 y.o. female MRN [id number]  Date of birth: 07-09-1956  CC & HPI:  Chief Complaint  Patient presents with  . Sore Throat  . Sinusitis  . Cough  . Fever    Patient reports a one-day history of fever still 101F. She has significant ear pain and pressure. Reported diaphoresis over the night and has had generalized malaise and fatigue throughout the day today. She denies any significant respiratory involvement. Denies any dysuria, frequency or hematuria. She has had prior multiple ear infections in the past and reports as being similar to that. She is taking over-the-counter Delsym.   ROS:  Per HPI.   HISTORY: Past Medical, Surgical, Social, and Family History Reviewed & Updated per EMR.  Pertinent Historical Findings include: History of hypertension, asthma, seasonal allergies, parotid tumor, diabetic, depression. Medications reviewed. Nonsmoker   OBJECTIVE:  VS:   HT:5' 7.5" (171.5 cm)   WT:219 lb 2 oz (99.394 kg)  BMI:33.9          BP:124/78 mmHg  HR:73bpm  TEMP:98.4 F (36.9 C)(Oral)  RESP:95 %  PHYSICAL EXAM: GENERAL:  adult Caucasian female. In no discomfort; no respiratory distress   PSYCH: alert and appropriate, good insight   HNEENT: mmm, no JVD, tender right anterior cervical lymphadenopathy Right tympanic membrane bulging with erythema. Effusion. Next line right tympanic membrane normal-appearing. Oropharynx with streaking erythema, no posterior or pharyngeal exudate or deviation.   CARDIAC: RRR, S1/S2 heard, no murmur  LUNGS: CTA B, no wheezes, no crackles  EXTREM: Warm, well perfused.  Moves all 4 extremities spontaneously; no lateralization.  No pretibial edema.     ASSESSMENT: 1. Acute nonsuppurative otitis media of right ear    PLAN: See problem based charting & AVS for additional documentation.  Amoxicillin  OTC symptomatic treatment > Return if symptoms worsen or fail to improve.     Meds ordered this encounter  Medications  .  amoxicillin (AMOXIL) 500 MG capsule    Sig: Take 1 capsule (500 mg total) by mouth 2 (two) times daily.    Dispense:  20 capsule    Refill:  0    No orders of the defined types were placed in this encounter.

## 2014-07-18 NOTE — Patient Instructions (Signed)

## 2014-09-27 ENCOUNTER — Ambulatory Visit: Payer: Self-pay | Admitting: Family

## 2014-10-03 ENCOUNTER — Ambulatory Visit (INDEPENDENT_AMBULATORY_CARE_PROVIDER_SITE_OTHER): Payer: BLUE CROSS/BLUE SHIELD | Admitting: Family Medicine

## 2014-10-03 ENCOUNTER — Encounter: Payer: Self-pay | Admitting: Family Medicine

## 2014-10-03 VITALS — BP 130/80 | Temp 97.9°F | Wt 218.0 lb

## 2014-10-03 DIAGNOSIS — G43109 Migraine with aura, not intractable, without status migrainosus: Secondary | ICD-10-CM | POA: Insufficient documentation

## 2014-10-03 DIAGNOSIS — G43119 Migraine with aura, intractable, without status migrainosus: Secondary | ICD-10-CM

## 2014-10-03 MED ORDER — TOPIRAMATE 25 MG PO TABS: ORAL_TABLET | ORAL | Status: DC

## 2014-10-03 MED ORDER — RIZATRIPTAN BENZOATE 10 MG PO TBDP: 10.0000 mg | ORAL_TABLET | ORAL | Status: DC | PRN

## 2014-10-03 NOTE — Patient Instructions (Addendum)
Maxalt 10 mg......... one sublingual when necessary stat Topamax 25 mg.......Marland Kitchen 1 at bedtime  Return in one month for follow-up  Call [facility] orthoped[facility] [phone] and asked to see one of the orthopedists about your leg pain

## 2014-10-03 NOTE — Progress Notes (Signed)
   Subjective:    Patient ID: Rachel Gates, female    DOB: 02-02-1956, 59 y.o.   MRN: [id number]  HPI Rachel Gates is a 59 year old married female nonsmoker who comes in today for evaluation of 2 issues  She got a history of migraine headaches and she's been on Maxalt 5 mg daily. She says her migraines are increasing in severity and frequency. She had one last Wednesday took 5 mg Maxalt but it didn't help. She had an aura preceding the migraine. Sometime she can't take the medication immediately.  She's also been pain in her right hip rating down to her ankle that comes and goes. Aspirin bother about 3 weeks. No history of trauma. She is overweight 218 pounds   Review of Systems Review of systems otherwise negative    Objective:   Physical Exam Well-developed well-nourished female no acute distress vital signs stable she's afebrile  Ankle right knee hip appear normal no palpable tenderness       Assessment & Plan:  Migraine headaches......... discussed options....... increase the Maxalt to 10 mg a day...... add Topamax........ follow-up in one month  Right hip pain........Marland Kitchen or throat consult

## 2014-10-03 NOTE — Progress Notes (Signed)
Pre visit review using our clinic review tool, if applicable. No additional management support is needed unless otherwise documented below in the visit note. 

## 2014-11-20 ENCOUNTER — Telehealth: Payer: Self-pay | Admitting: Family Medicine

## 2014-11-20 ENCOUNTER — Encounter: Payer: Self-pay | Admitting: Family Medicine

## 2014-11-20 ENCOUNTER — Ambulatory Visit: Payer: BLUE CROSS/BLUE SHIELD | Admitting: Family Medicine

## 2014-11-20 ENCOUNTER — Ambulatory Visit (INDEPENDENT_AMBULATORY_CARE_PROVIDER_SITE_OTHER): Payer: BLUE CROSS/BLUE SHIELD | Admitting: Family Medicine

## 2014-11-20 VITALS — BP 120/80

## 2014-11-20 DIAGNOSIS — F411 Generalized anxiety disorder: Secondary | ICD-10-CM | POA: Diagnosis not present

## 2014-11-20 MED ORDER — LORAZEPAM 1 MG PO TABS: 1.0000 mg | ORAL_TABLET | Freq: Two times a day (BID) | ORAL | Status: DC | PRN

## 2014-11-20 NOTE — Telephone Encounter (Signed)
Pt husband called wife need a note saying she was at the doctor today and that she could not go back to work today. Husband need to pick that note up today or in the morning please give a call.   Cell phone [phone]

## 2014-11-20 NOTE — Telephone Encounter (Signed)
Letter ready for pick up
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
## 2014-11-20 NOTE — Progress Notes (Signed)
Pre visit review using our clinic review tool, if applicable. No additional management support is needed unless otherwise documented below in the visit note. 

## 2014-11-20 NOTE — Patient Instructions (Signed)
Ativan 1 mg..........Marland Kitchen 1/2-1 tablet twice daily  Call and make an appointment to see Dr. Glennon Hamilton  next Wednesday  In the meantime if you do not feel like you can wait until next Wednesday........ you can go directly to  behavioral health for an immediate evaluation

## 2014-11-20 NOTE — Telephone Encounter (Signed)
[place] [facility] Client [facility] Patient Name: Rachel Gates DOB: [birth year] Initial Comment Caller states his wife is having anxiety Nurse Assessment Nurse: Donalynn Furlong, RN, Myna Hidalgo Date/Time Eilene Ghazi Time): 11/20/2014 10:37:58 AM Confirm and document reason for call. If symptomatic, describe symptoms. ---Caller states his wife is having anxiety, dealing with home situation. Pt is having insomnia as well as high anxiety Has the patient traveled out of the country within the last 30 days? ---Not Applicable Does the patient require triage? ---Declined Triage Please document clinical information provided and list any resource used. ---pt's husband has pt in car now during call. When I attempted to perform triage, the husband interrupted, telling me he wanted an appt TODAY, with Dr Sherren Mocha. I told him that after triage, I would attempt an appt with Dr Sherren Mocha today, however if he was unable to see them, that it may be with one of his associates if he is unavailable. He then declined triage, told me they were headed there anyway, and that Dr Sherren Mocha would " work him in". TC to office. Spoke with Arbie Cookey and Dr Gweneth Fritter nurse to update re: pt situation, and any available information that I was able to obtain during the call. Guidelines Guideline Title Affirmed Question Affirmed Notes Final Disposition User Clinical Call Donalynn Furlong, RN, Myna Hidalgo

## 2014-11-22 NOTE — Telephone Encounter (Signed)
Noted and patient was seen in office.

## 2014-11-29 ENCOUNTER — Ambulatory Visit (INDEPENDENT_AMBULATORY_CARE_PROVIDER_SITE_OTHER): Payer: BLUE CROSS/BLUE SHIELD | Admitting: Psychology

## 2014-11-29 DIAGNOSIS — F4322 Adjustment disorder with anxiety: Secondary | ICD-10-CM | POA: Diagnosis not present

## 2014-12-13 ENCOUNTER — Ambulatory Visit: Payer: BLUE CROSS/BLUE SHIELD | Admitting: Psychology

## 2014-12-19 ENCOUNTER — Ambulatory Visit (INDEPENDENT_AMBULATORY_CARE_PROVIDER_SITE_OTHER): Payer: BLUE CROSS/BLUE SHIELD | Admitting: Emergency Medicine

## 2014-12-19 VITALS — BP 130/80 | HR 86 | Temp 98.3°F | Resp 16 | Ht 68.0 in | Wt 222.0 lb

## 2014-12-19 DIAGNOSIS — J209 Acute bronchitis, unspecified: Secondary | ICD-10-CM | POA: Diagnosis not present

## 2014-12-19 DIAGNOSIS — J0140 Acute pansinusitis, unspecified: Secondary | ICD-10-CM

## 2014-12-19 MED ORDER — AMOXICILLIN-POT CLAVULANATE 875-125 MG PO TABS
1.0000 | ORAL_TABLET | Freq: Two times a day (BID) | ORAL | Status: DC
Start: 2014-12-19 — End: 2015-03-23

## 2014-12-19 MED ORDER — HYDROCOD POLST-CPM POLST ER 10-8 MG/5ML PO SUER: 5.0000 mL | Freq: Two times a day (BID) | ORAL | Status: DC

## 2014-12-19 MED ORDER — PSEUDOEPHEDRINE-GUAIFENESIN ER 60-600 MG PO TB12: 1.0000 | ORAL_TABLET | Freq: Two times a day (BID) | ORAL | Status: DC

## 2014-12-19 NOTE — Patient Instructions (Signed)

## 2014-12-19 NOTE — Progress Notes (Signed)
[facility] [address] [place] [address] [phone]  Date:  12/19/2014   Name:  Rachel Gates   DOB:  1955-12-13   MRN:  [id number]  PCP:  Joycelyn Man, MD    Chief Complaint: Sore Throat; Cough; and Otalgia   History of Present Illness:  Rachel Gates is a 59 y.o. very pleasant female patient who presents with the following:  Ill for several days with nasal congestion and mucoid nasal drainage Has purulent post nasal drip. Cough that is non productive No wheezing or shortness of breath No fever but chills No nausea or vomiting.  No stool change No rash No improvement with over the counter medications or other home remedies.  Denies other complaint or health concern today.   Patient Active Problem List   Diagnosis Date Noted  . Migraine headache with aura 10/03/2014  . Acute abdominal pain 12/28/2012  . 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 Seborrheic keratosis, inflamed 04/27/2012  . Asthma exacerbation, non-allergic 09/09/2011  . BENIGN POSITIONAL VERTIGO 05/22/2009  . HYPERTENSION 07/03/2008  . OVERWEIGHT 01/24/2008  . HYPERLIPIDEMIA 01/08/2007  . Anxiety state 01/08/2007  . DEPRESSION 01/08/2007  . ALLERGIC RHINITIS 01/08/2007    Past Medical History  Diagnosis Date  . MHA (microangiopathic hemolytic anemia)   . Allergy   . Hyperlipidemia   . Anxiety   . Depression   . Hypertension   . Obese     Past Surgical History  Procedure Laterality Date  . Abdominal hysterectomy    . Cholecystectomy      History  Substance Use Topics  . Smoking status: Never Smoker   . Smokeless tobacco: Never Used  . Alcohol Use: No    Family History  Problem Relation Age of Onset  . Hypertension Mother   . Cancer Mother     uterine  . Angina Mother   . Heart attack Father   . Glaucoma Father     No Known Allergies  Medication list has been reviewed and updated.  Current Outpatient Prescriptions on File Prior to Visit  Medication  Sig Dispense Refill  . aspirin 81 MG EC tablet Take 81 mg by mouth daily.      Marland Kitchen atenolol (TENORMIN) 50 MG tablet Take 1 tablet (50 mg total) by mouth daily. 90 tablet 3  . calcium carbonate (OS-CAL) 600 MG TABS Take 600 mg by mouth 2 (two) times daily with a meal.      . citalopram (CELEXA) 10 MG tablet Take 1 tablet (10 mg total) by mouth daily. 90 tablet 3  . fish oil-omega-3 fatty acids 1000 MG capsule Take 1 g by mouth daily.      Marland Kitchen HYDROcodone-acetaminophen (NORCO/VICODIN) 5-325 MG per tablet Take 1 tablet by mouth every 8 (eight) hours as needed for moderate pain. 30 tablet 0  . loratadine (CLARITIN) 10 MG tablet Take 10 mg by mouth daily.    Marland Kitchen LORazepam (ATIVAN) 1 MG tablet Take 1 tablet (1 mg total) by mouth 2 (two) times daily as needed for anxiety. 60 tablet 1  . Multiple Vitamin (MULTIVITAMIN) tablet Take 1 tablet by mouth daily.      . simvastatin (ZOCOR) 20 MG tablet Take 1 tablet (20 mg total) by mouth at bedtime. 100 tablet 3  . ondansetron (ZOFRAN) 4 MG/5ML solution Take 5 mLs (4 mg total) by mouth once. (Patient not taking: Reported on 12/19/2014) 50 mL 1  . rizatriptan (MAXALT-MLT) 10 MG disintegrating tablet Take 1 tablet (10  mg total) by mouth as needed for migraine. May repeat in 2 hours if needed (Patient not taking: Reported on 12/19/2014) 15 tablet 3   No current facility-administered medications on file prior to visit.    Review of Systems:  Review of Systems  Constitutional: Negative for fever, chills and fatigue.  HENT: Negative for congestion, ear pain, hearing loss, postnasal drip, rhinorrhea and sinus pressure.   Eyes: Negative for discharge and redness.  Respiratory: Negative for cough, shortness of breath and wheezing.   Cardiovascular: Negative for chest pain and leg swelling.  Gastrointestinal: Negative for nausea, vomiting, abdominal pain, constipation and blood in stool.  Genitourinary: Negative for dysuria, urgency and frequency.  Musculoskeletal:  Negative for neck stiffness.  Skin: Negative for rash.  Neurological: Negative for seizures, weakness and headaches.   Physical Examination: Filed Vitals:   12/19/14 0937  BP: 130/80  Pulse: 86  Temp: 98.3 F (36.8 C)  Resp: 16   Filed Vitals:   12/19/14 0937  Height: 5\' 8"  (1.727 m)  Weight: 222 lb (100.699 kg)   Body mass index is 33.76 kg/(m^2). Ideal Body Weight: Weight in (lb) to have BMI = 25: 164.1  GEN: WDWN, NAD, Non-toxic, A & O x 3 HEENT: Atraumatic, Normocephalic. Neck supple. No masses, No LAD.   Ears and Nose: No external deformity.  Purulent nasal drainage CV: RRR, No M/G/R. No JVD. No thrill. No extra heart sounds. PULM: CTA B, no wheezes, crackles, rhonchi. No retractions. No resp. distress. No accessory muscle use. ABD: S, NT, ND, +BS. No rebound. No HSM. EXTR: No c/c/e NEURO Normal gait.  PSYCH: Normally interactive. Conversant. Not depressed or anxious appearing.  Calm demeanor.    Assessment and Plan: 1. Acute bronchitis, unspecified organism Increase liquids - amoxicillin-clavulanate (AUGMENTIN) 875-125 MG per tablet; Take 1 tablet by mouth 2 (two) times daily.  Dispense: 20 tablet; Refill: 0 - chlorpheniramine-HYDROcodone (TUSSIONEX PENNKINETIC ER) 10-8 MG/5ML SUER; Take 5 mLs by mouth 2 (two) times daily.  Dispense: 60 mL; Refill: 0  2. Acute pansinusitis, recurrence not specified Increase liquids - amoxicillin-clavulanate (AUGMENTIN) 875-125 MG per tablet; Take 1 tablet by mouth 2 (two) times daily.  Dispense: 20 tablet; Refill: 0 - pseudoephedrine-guaifenesin (MUCINEX D) 60-600 MG per tablet; Take 1 tablet by mouth every 12 (twelve) hours.  Dispense: 18 tablet; Refill: 0    Signed Ellison Carwin, MD

## 2014-12-21 ENCOUNTER — Ambulatory Visit: Payer: BLUE CROSS/BLUE SHIELD | Admitting: Psychology

## 2014-12-25 ENCOUNTER — Telehealth: Payer: Self-pay | Admitting: *Deleted

## 2014-12-25 DIAGNOSIS — Z0279 Encounter for issue of other medical certificate: Secondary | ICD-10-CM

## 2014-12-25 NOTE — Telephone Encounter (Signed)
Patient husband walked into the office today to have [place] paperwork filled out.  Husband states that the paperwork needs to be filled out by tomorrow. Sharyn Lull explained to the patient that office procedure is to have paperwork filled out in 3-5 business days, but we would try to have it filled out by tomorrow. Husband verbally understood.

## 2014-12-26 ENCOUNTER — Telehealth: Payer: Self-pay | Admitting: *Deleted

## 2014-12-26 NOTE — Telephone Encounter (Signed)
Husband is aware the [employer] paperwork is ready for pick up.  Any further concerns should be address by Lucina Mellow, RN.

## 2014-12-27 ENCOUNTER — Other Ambulatory Visit (HOSPITAL_COMMUNITY): Payer: Self-pay | Admitting: Obstetrics and Gynecology

## 2014-12-27 DIAGNOSIS — Z1231 Encounter for screening mammogram for malignant neoplasm of breast: Secondary | ICD-10-CM

## 2015-01-04 ENCOUNTER — Ambulatory Visit (HOSPITAL_COMMUNITY)
Admission: RE | Admit: 2015-01-04 | Discharge: 2015-01-04 | Disposition: A | Discharge status: Home / Self Care | Payer: BLUE CROSS/BLUE SHIELD | Source: Ambulatory Visit | Attending: Obstetrics and Gynecology | Admitting: Obstetrics and Gynecology

## 2015-01-04 DIAGNOSIS — Z1231 Encounter for screening mammogram for malignant neoplasm of breast: Secondary | ICD-10-CM | POA: Insufficient documentation

## 2015-01-04 IMAGING — MG MM DIGITAL SCREENING BILATERAL
4 series · 4 of 4 positions shown · non-contrast
Comparison: Previous exam(s).

CLINICAL DATA: Screening.

EXAM:
DIGITAL SCREENING BILATERAL MAMMOGRAM WITH CAD

[L MLO]
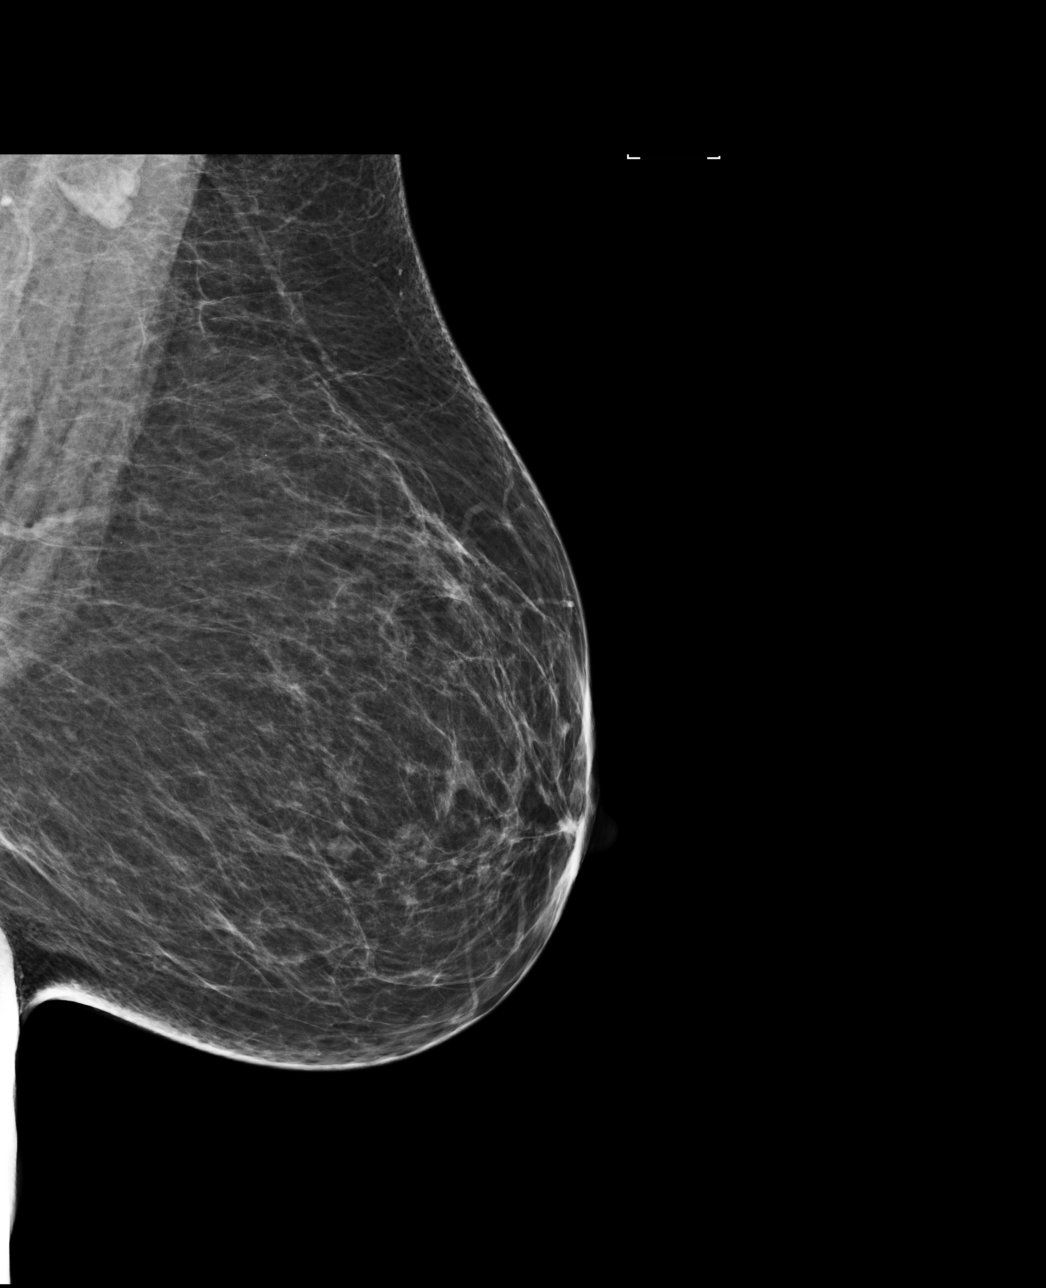

[L CC]
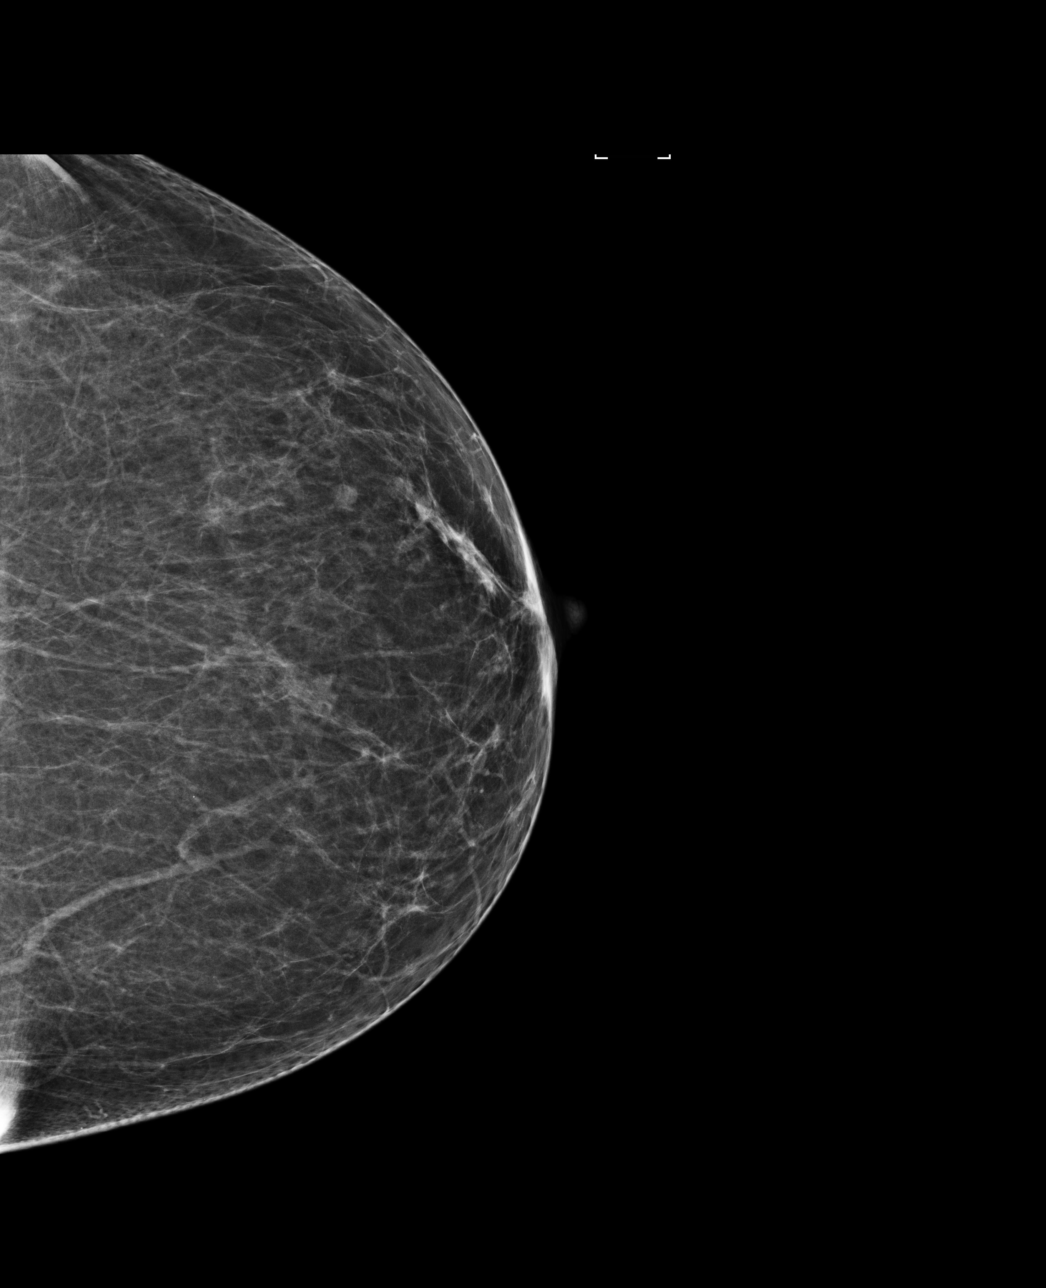

[R CC]
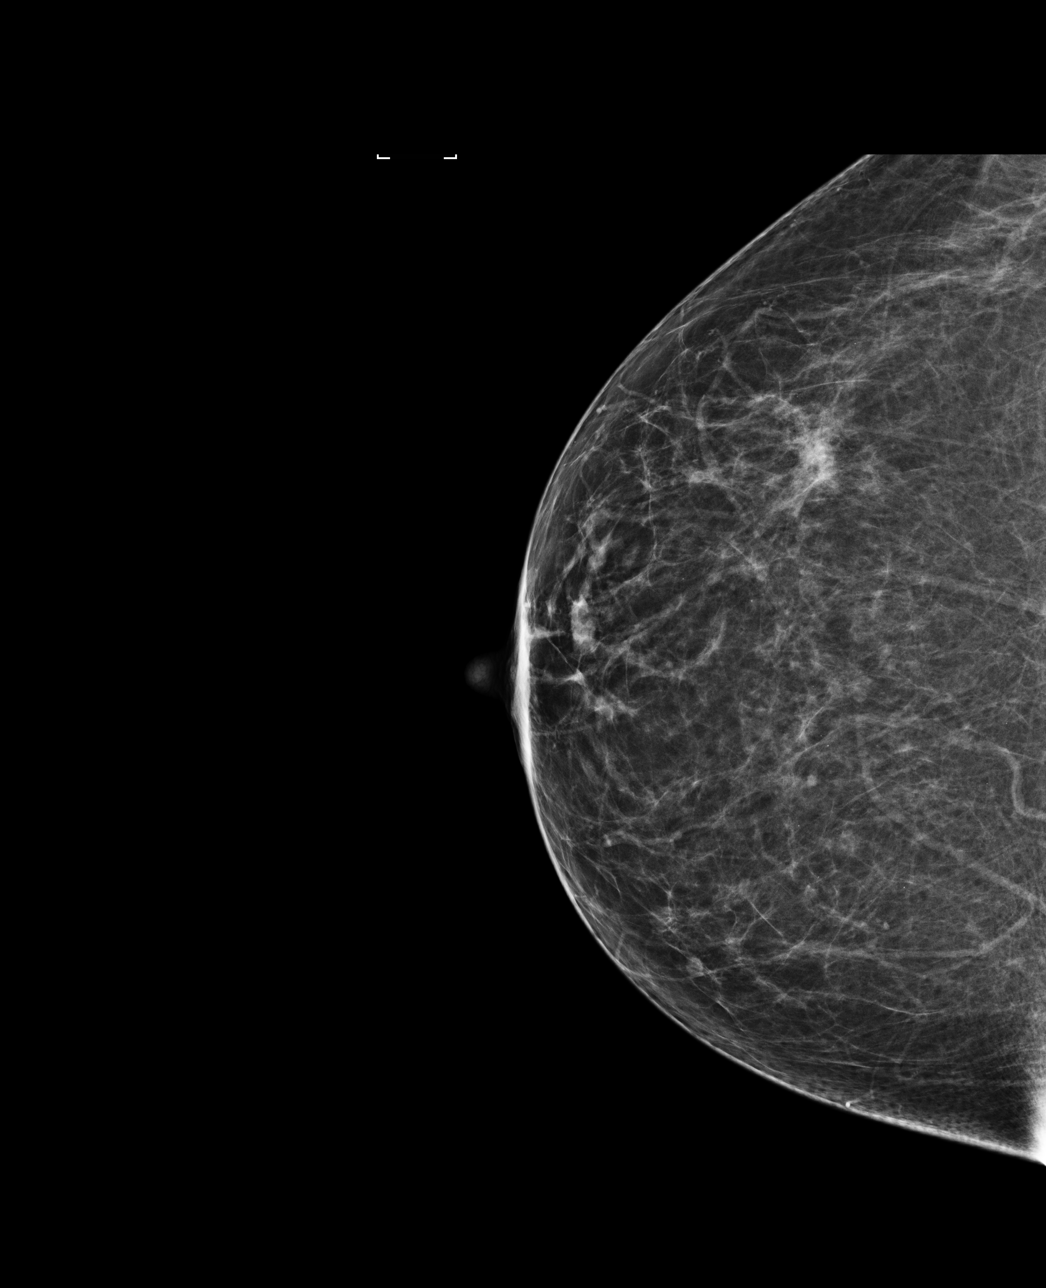

[R MLO]
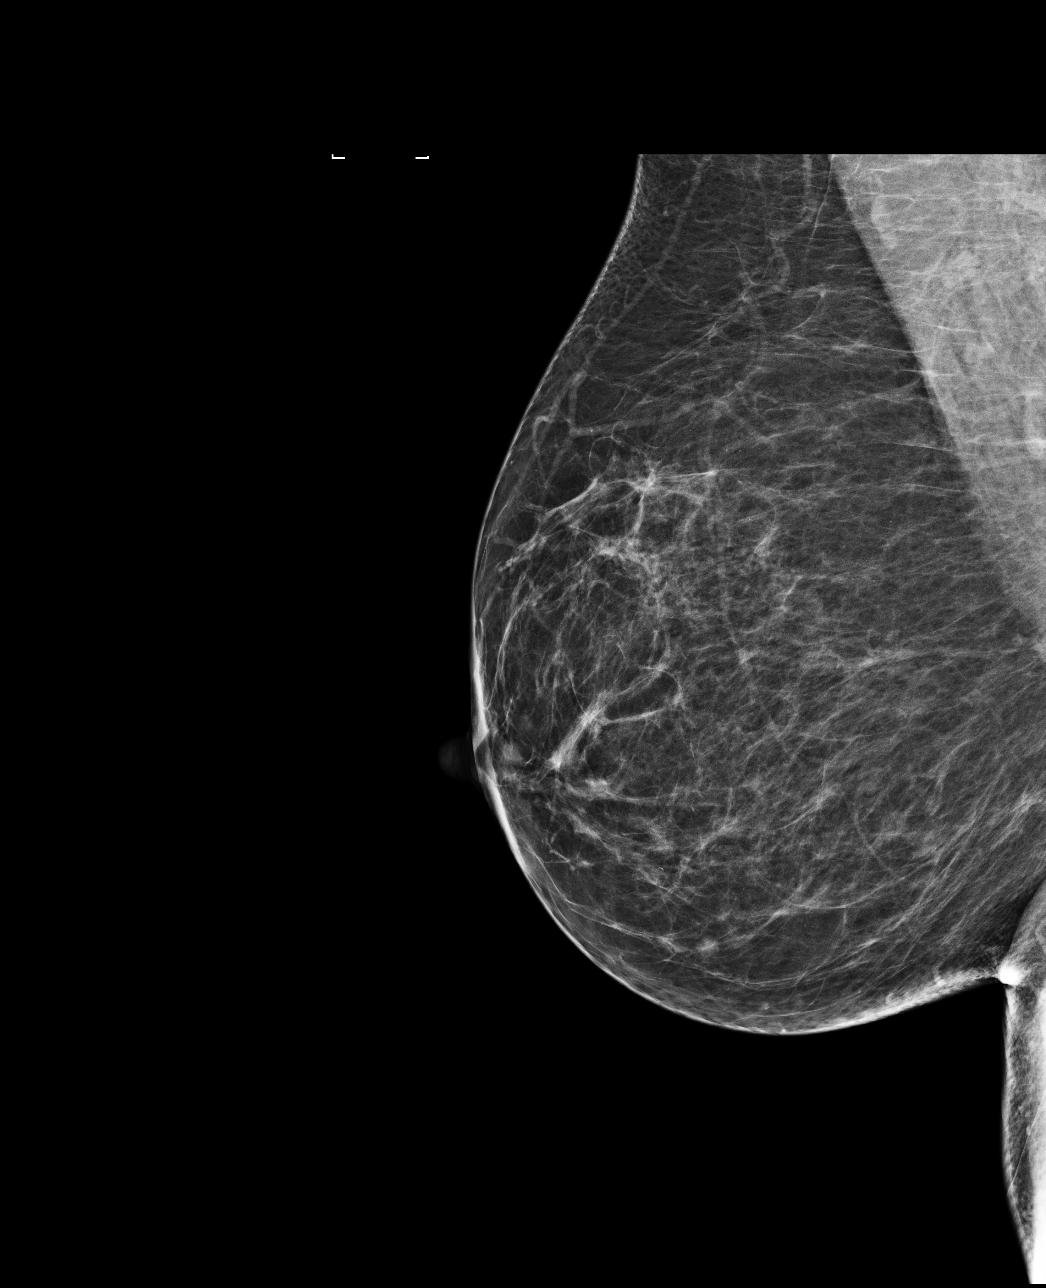

[4 of 4 positions shown; findings below may reference images not displayed]

ACR Breast Density Category b: There are scattered areas of
fibroglandular density.
FINDINGS: There are no findings suspicious for malignancy. Images were
processed with CAD.
IMPRESSION: No mammographic evidence of malignancy. A result letter of this
screening mammogram will be mailed directly to the patient.

RECOMMENDATION:
Screening mammogram in one year. (Code:[US])

BI-RADS CATEGORY  1: Negative.

## 2015-01-24 ENCOUNTER — Ambulatory Visit (INDEPENDENT_AMBULATORY_CARE_PROVIDER_SITE_OTHER): Payer: BLUE CROSS/BLUE SHIELD | Admitting: Psychology

## 2015-01-24 DIAGNOSIS — F4322 Adjustment disorder with anxiety: Secondary | ICD-10-CM | POA: Diagnosis not present

## 2015-02-21 ENCOUNTER — Ambulatory Visit: Payer: BLUE CROSS/BLUE SHIELD | Admitting: Psychology

## 2015-03-22 ENCOUNTER — Ambulatory Visit (INDEPENDENT_AMBULATORY_CARE_PROVIDER_SITE_OTHER): Payer: BLUE CROSS/BLUE SHIELD

## 2015-03-22 ENCOUNTER — Ambulatory Visit (INDEPENDENT_AMBULATORY_CARE_PROVIDER_SITE_OTHER): Payer: BLUE CROSS/BLUE SHIELD | Admitting: Family Medicine

## 2015-03-22 VITALS — BP 122/76 | HR 60 | Temp 97.8°F | Ht 67.5 in | Wt 226.0 lb

## 2015-03-22 DIAGNOSIS — R110 Nausea: Secondary | ICD-10-CM

## 2015-03-22 DIAGNOSIS — R197 Diarrhea, unspecified: Secondary | ICD-10-CM

## 2015-03-22 DIAGNOSIS — K529 Noninfective gastroenteritis and colitis, unspecified: Secondary | ICD-10-CM

## 2015-03-22 DIAGNOSIS — R1084 Generalized abdominal pain: Secondary | ICD-10-CM

## 2015-03-22 LAB — LIPASE: Lipase: 11 U/L (ref 7–60)

## 2015-03-22 LAB — POCT CBC
Granulocyte percent: 63.4 %G (ref 37–80)
HCT, POC: 46.2 % (ref 37.7–47.9)
Hemoglobin: 14.8 g/dL (ref 12.2–16.2)
Lymph, poc: 2.8 (ref 0.6–3.4)
MCH, POC: 29.1 pg (ref 27–31.2)
MCHC: 32.1 g/dL (ref 31.8–35.4)
MCV: 90.7 fL (ref 80–97)
MID (cbc): 0.5 (ref 0–0.9)
MPV: 8.8 fL (ref 0–99.8)
POC Granulocyte: 5.8 (ref 2–6.9)
POC LYMPH PERCENT: 30.7 % (ref 10–50)
POC MID %: 5.9 %M (ref 0–12)
Platelet Count, POC: 238 10*3/uL (ref 142–424)
RBC: 5.09 M/uL (ref 4.04–5.48)
RDW, POC: 13.8 %
WBC: 9.2 10*3/uL (ref 4.6–10.2)

## 2015-03-22 LAB — COMPLETE METABOLIC PANEL WITHOUT GFR
ALT: 16 U/L (ref 6–29)
Albumin: 4.3 g/dL (ref 3.6–5.1)
Alkaline Phosphatase: 61 U/L (ref 33–130)
CO2: 30 mmol/L (ref 20–31)
GFR, Est Non African American: 88 mL/min (ref >=60)
Glucose, Bld: 92 mg/dL (ref 65–99)
Potassium: 4.7 mmol/L (ref 3.5–5.3)
Sodium: 145 mmol/L (ref 135–146)
Total Protein: 6.8 g/dL (ref 6.1–8.1)

## 2015-03-22 LAB — GLUCOSE, POCT (MANUAL RESULT ENTRY): POC Glucose: 82 mg/dl (ref 70–99)

## 2015-03-22 LAB — COMPLETE METABOLIC PANEL WITH GFR
AST: 20 U/L (ref 10–35)
BUN: 11 mg/dL (ref 7–25)
Calcium: 9.5 mg/dL (ref 8.6–10.4)
Chloride: 106 mmol/L (ref 98–110)
Creat: 0.74 mg/dL (ref 0.50–0.99)
GFR, Est African American: >89 mL/min (ref >=60)
Total Bilirubin: 0.7 mg/dL (ref 0.2–1.2)

## 2015-03-22 MED ORDER — ONDANSETRON 4 MG PO TBDP: 4.0000 mg | ORAL_TABLET | Freq: Three times a day (TID) | ORAL | Status: DC | PRN

## 2015-03-22 NOTE — Progress Notes (Addendum)
Chief Complaint:  Chief Complaint  Patient presents with  . Emesis    Onset yesterday through last night  . Diarrhea  . Fatigue    HPI: Rachel Gates is a 59 y.o. female who reports to [facility] today complaining of nonbloody diarrhea since Tuesday, no new meds, abx, travels foods.  No one else has it, however grandkids had GI bug about 1.5 weeks ago. She had fevers and chills last night. She has had normal colonscopy 8 years ago. No hx of diverticular disease. Has well water.   Has had poor PO.   Past Medical History  Diagnosis Date  . MHA (microangiopathic hemolytic anemia)   . Allergy   . Hyperlipidemia   . Anxiety   . Depression   . Hypertension   . Obese    Past Surgical History  Procedure Laterality Date  . Abdominal hysterectomy    . Cholecystectomy     Social History   Social History  . Marital Status: Married    Spouse Name: [name]  . Number of Children: 3  . Years of Education: 12+   Occupational History  . TELLER SUPERVISOR    Social History Main Topics  . Smoking status: Never Smoker   . Smokeless tobacco: Never Used  . Alcohol Use: No  . Drug Use: No  . Sexual Activity: Not Asked   Other Topics Concern  . None   Social History Narrative   Lives with her husband. Their adult children live nearby.   Family History  Problem Relation Age of Onset  . Hypertension Mother   . Cancer Mother     uterine  . Angina Mother   . Heart attack Father   . Glaucoma Father    No Known Allergies Prior to Admission medications   Medication Sig Start Date End Date Taking? Authorizing Provider  amoxicillin-clavulanate (AUGMENTIN) 875-125 MG per tablet Take 1 tablet by mouth 2 (two) times daily. 12/19/14  Yes Roselee Culver, MD  aspirin 81 MG EC tablet Take 81 mg by mouth daily.     Yes Historical Provider, MD  atenolol (TENORMIN) 50 MG tablet Take 1 tablet (50 mg total) by mouth daily. 05/08/14  Yes Dorena Cookey, MD  calcium carbonate  (OS-CAL) 600 MG TABS Take 600 mg by mouth 2 (two) times daily with a meal.     Yes Historical Provider, MD  chlorpheniramine-HYDROcodone (TUSSIONEX PENNKINETIC ER) 10-8 MG/5ML SUER Take 5 mLs by mouth 2 (two) times daily. 12/19/14  Yes Roselee Culver, MD  citalopram (CELEXA) 10 MG tablet Take 1 tablet (10 mg total) by mouth daily. 05/08/14  Yes Dorena Cookey, MD  fish oil-omega-3 fatty acids 1000 MG capsule Take 1 g by mouth daily.     Yes Historical Provider, MD  HYDROcodone-acetaminophen (NORCO/VICODIN) 5-325 MG per tablet Take 1 tablet by mouth every 8 (eight) hours as needed for moderate pain. 05/08/14  Yes Dorena Cookey, MD  loratadine (CLARITIN) 10 MG tablet Take 10 mg by mouth daily.   Yes Historical Provider, MD  Multiple Vitamin (MULTIVITAMIN) tablet Take 1 tablet by mouth daily.     Yes Historical Provider, MD  pseudoephedrine-guaifenesin (MUCINEX D) 60-600 MG per tablet Take 1 tablet by mouth every 12 (twelve) hours. 12/19/14 12/19/15 Yes Roselee Culver, MD  simvastatin (ZOCOR) 20 MG tablet Take 1 tablet (20 mg total) by mouth at bedtime. 05/08/14  Yes Dorena Cookey, MD  LORazepam (ATIVAN) 1 MG tablet  Take 1 tablet (1 mg total) by mouth 2 (two) times daily as needed for anxiety. Patient not taking: Reported on 03/22/2015 11/20/14   Dorena Cookey, MD  ondansetron [facility]) 4 MG/5ML solution Take 5 mLs (4 mg total) by mouth once. Patient not taking: Reported on 12/19/2014 01/11/14   Dorena Cookey, MD  rizatriptan (MAXALT-MLT) 10 MG disintegrating tablet Take 1 tablet (10 mg total) by mouth as needed for migraine. May repeat in 2 hours if needed Patient not taking: Reported on 12/19/2014 10/03/14   Dorena Cookey, MD     ROS: The patient denies fevers, chills, night sweats, unintentional weight loss, chest pain, palpitations, wheezing, dyspnea on exertion,, dysuria, hematuria, melena, numbness, weakness, or tingling.   All other systems have been reviewed and were otherwise negative with  the exception of those mentioned in the HPI and as above.    PHYSICAL EXAM: Filed Vitals:   03/22/15 0937  BP: 122/76  Pulse: 60  Temp: 97.8 F (36.6 C)   Body mass index is 34.85 kg/(m^2).   General: Alert, no acute distress HEENT:  Normocephalic, atraumatic, oropharynx patent. EOMI, PERRLA Dry oral mucosa Cardiovascular:  Regular rate and rhythm, no rubs murmurs or gallops.  No Carotid bruits, radial pulse intact. No pedal edema.  Respiratory: Clear to auscultation bilaterally.  No wheezes, rales, or rhonchi.  No cyanosis, no use of accessory musculature Abdominal: No organomegaly, abdomen is soft and diffusely tender, decrease bowel sounds. No masses. Skin: No rashes. Neurologic: Facial musculature symmetric. Psychiatric: Patient acts appropriately throughout our interaction. Lymphatic: No cervical or submandibular lymphadenopathy Musculoskeletal: Gait intact. No edema, tenderness   LABS: Results for orders placed or performed in visit on 03/22/15  POCT CBC  Result Value Ref Range   WBC 9.2 4.6 - 10.2 K/uL   Lymph, poc 2.8 0.6 - 3.4   POC LYMPH PERCENT 30.7 10 - 50 %L   MID (cbc) 0.5 0 - 0.9   POC MID % 5.9 0 - 12 %M   POC Granulocyte 5.8 2 - 6.9   Granulocyte percent 63.4 37 - 80 %G   RBC 5.09 4.04 - 5.48 M/uL   Hemoglobin 14.8 12.2 - 16.2 g/dL   HCT, POC 46.2 37.7 - 47.9 %   MCV 90.7 80 - 97 fL   MCH, POC 29.1 27 - 31.2 pg   MCHC 32.1 31.8 - 35.4 g/dL   RDW, POC 13.8 %   Platelet Count, POC 238 142 - 424 K/uL   MPV 8.8 0 - 99.8 fL  POCT glucose (manual entry)  Result Value Ref Range   POC Glucose 82 70 - 99 mg/dl     EKG/XRAY:   Primary read interpreted by Dr. Marin Comment at [facility]. No free air, no obstruction   ASSESSMENT/PLAN: Encounter Diagnoses  Name Primary?  . Generalized abdominal pain   . Nausea without vomiting   . Diarrhea   . Noninfectious gastroenteritis, unspecified Yes   Zofran odt IVF x 2 felt better, able to tolerate PO challenge Labs  pending, will bring stool sample  BRAT diet  Gross sideeffects, risk and benefits, and alternatives of medications d/w patient. Patient is aware that all medications have potential sideeffects and we are unable to predict every sideeffect or drug-drug interaction that may occur.  Rachel Brassfield DO  03/22/2015 12:58 PM   LM about stools OP and cx, if sxs still then need to treat yeast but can be normal. No other bacteria.

## 2015-03-22 NOTE — Patient Instructions (Addendum)
Viral Gastroenteritis Viral gastroenteritis is also known as stomach flu. This condition affects the stomach and intestinal tract. It can cause sudden diarrhea and vomiting. The illness typically lasts 3 to 8 days. Most people develop an immune response that eventually gets rid of the virus. While this natural response develops, the virus can make you quite ill. CAUSES  Many different viruses can cause gastroenteritis, such as rotavirus or noroviruses. You can catch one of these viruses by consuming contaminated food or water. You may also catch a virus by sharing utensils or other personal items with an infected person or by touching a contaminated surface. SYMPTOMS  The most common symptoms are diarrhea and vomiting. These problems can cause a severe loss of body fluids (dehydration) and a body salt (electrolyte) imbalance. Other symptoms may include:  Fever.  Headache.  Fatigue.  Abdominal pain. DIAGNOSIS  Your caregiver can usually diagnose viral gastroenteritis based on your symptoms and a physical exam. A stool sample may also be taken to test for the presence of viruses or other infections. TREATMENT  This illness typically goes away on its own. Treatments are aimed at rehydration. The most serious cases of viral gastroenteritis involve vomiting so severely that you are not able to keep fluids down. In these cases, fluids must be given through an intravenous line (IV). HOME CARE INSTRUCTIONS   Drink enough fluids to keep your urine clear or pale yellow. Drink small amounts of fluids frequently and increase the amounts as tolerated.  Ask your caregiver for specific rehydration instructions.  Avoid:  Foods high in sugar.  Alcohol.  Carbonated drinks.  Tobacco.  Juice.  Caffeine drinks.  Extremely hot or cold fluids.  Fatty, greasy foods.  Too much intake of anything at one time.  Dairy products until 24 to 48 hours after diarrhea stops.  You may consume probiotics.  Probiotics are active cultures of beneficial bacteria. They may lessen the amount and number of diarrheal stools in adults. Probiotics can be found in yogurt with active cultures and in supplements.  Wash your hands well to avoid spreading the virus.  Only take over-the-counter or prescription medicines for pain, discomfort, or fever as directed by your caregiver. Do not give aspirin to children. Antidiarrheal medicines are not recommended.  Ask your caregiver if you should continue to take your regular prescribed and over-the-counter medicines.  Keep all follow-up appointments as directed by your caregiver. SEEK IMMEDIATE MEDICAL CARE IF:   You are unable to keep fluids down.  You do not urinate at least once every 6 to 8 hours.  You develop shortness of breath.  You notice blood in your stool or vomit. This may look like coffee grounds.  You have abdominal pain that increases or is concentrated in one small area (localized).  You have persistent vomiting or diarrhea.  You have a fever.  The patient is a child younger than 3 months, and he or she has a fever.  The patient is a child older than 3 months, and he or she has a fever and persistent symptoms.  The patient is a child older than 3 months, and he or she has a fever and symptoms suddenly get worse.  The patient is a baby, and he or she has no tears when crying. MAKE SURE YOU:   Understand these instructions.  Will watch your condition.  Will get help right away if you are not doing well or get worse. Document Released: 07/28/2005 Document Revised: 10/20/2011 Document Reviewed: 05/14/2011   ExitCare Patient Information 2015 [place]. This information is not intended to replace advice given to you by your health care provider. Make sure you discuss any questions you have with your health care provider. Food Choices to Help Relieve Diarrhea When you have diarrhea, the foods you eat and your eating habits are very  important. Choosing the right foods and drinks can help relieve diarrhea. Also, because diarrhea can last up to 7 days, you need to replace lost fluids and electrolytes (such as sodium, potassium, and chloride) in order to help prevent dehydration.  WHAT GENERAL GUIDELINES DO I NEED TO FOLLOW?  Slowly drink 1 cup (8 oz) of fluid for each episode of diarrhea. If you are getting enough fluid, your urine will be clear or pale yellow.  Eat starchy foods. Some good choices include white rice, white toast, pasta, low-fiber cereal, baked potatoes (without the skin), saltine crackers, and bagels.  Avoid large servings of any cooked vegetables.  Limit fruit to two servings per day. A serving is  cup or 1 small piece.  Choose foods with less than 2 g of fiber per serving.  Limit fats to less than 8 tsp (38 g) per day.  Avoid fried foods.  Eat foods that have probiotics in them. Probiotics can be found in certain dairy products.  Avoid foods and beverages that may increase the speed at which food moves through the stomach and intestines (gastrointestinal tract). Things to avoid include:  High-fiber foods, such as dried fruit, raw fruits and vegetables, nuts, seeds, and whole grain foods.  Spicy foods and high-fat foods.  Foods and beverages sweetened with high-fructose corn syrup, honey, or sugar alcohols such as xylitol, sorbitol, and mannitol. WHAT FOODS ARE RECOMMENDED? Grains White rice. White, [place], or pita breads (fresh or toasted), including plain rolls, buns, or bagels. White pasta. Saltine, soda, or graham crackers. Pretzels. Low-fiber cereal. Cooked cereals made with water (such as cornmeal, farina, or cream cereals). Plain muffins. Matzo. Melba toast. Zwieback.  Vegetables Potatoes (without the skin). Strained tomato and vegetable juices. Most well-cooked and canned vegetables without seeds. Tender lettuce. Fruits Cooked or canned applesauce, apricots, cherries, fruit cocktail,  grapefruit, peaches, pears, or plums. Fresh bananas, apples without skin, cherries, grapes, cantaloupe, grapefruit, peaches, oranges, or plums.  Meat and Other Protein Products Baked or boiled chicken. Eggs. Tofu. Fish. Seafood. Smooth peanut butter. Ground or well-cooked tender beef, ham, veal, lamb, pork, or poultry.  Dairy Plain yogurt, kefir, and unsweetened liquid yogurt. Lactose-free milk, buttermilk, or soy milk. Plain hard cheese. Beverages Sport drinks. Clear broths. Diluted fruit juices (except prune). Regular, caffeine-free sodas such as ginger ale. Water. Decaffeinated teas. Oral rehydration solutions. Sugar-free beverages not sweetened with sugar alcohols. Other Bouillon, broth, or soups made from recommended foods.  The items listed above may not be a complete list of recommended foods or beverages. Contact your dietitian for more options. WHAT FOODS ARE NOT RECOMMENDED? Grains Whole grain, whole wheat, bran, or rye breads, rolls, pastas, crackers, and cereals. Wild or brown rice. Cereals that contain more than 2 g of fiber per serving. Corn tortillas or taco shells. Cooked or dry oatmeal. Granola. Popcorn. Vegetables Raw vegetables. Cabbage, broccoli, Brussels sprouts, artichokes, baked beans, beet greens, corn, kale, legumes, peas, sweet potatoes, and yams. Potato skins. Cooked spinach and cabbage. Fruits Dried fruit, including raisins and dates. Raw fruits. Stewed or dried prunes. Fresh apples with skin, apricots, mangoes, pears, raspberries, and strawberries.  Meat and Other Protein Products Chunky peanut butter.  Nuts and seeds. Beans and lentils. Berniece Salines.  Dairy High-fat cheeses. Milk, chocolate milk, and beverages made with milk, such as milk shakes. Cream. Ice cream. Sweets and Desserts Sweet rolls, doughnuts, and sweet breads. Pancakes and waffles. Fats and Oils Butter. Cream sauces. Margarine. Salad oils. Plain salad dressings. Olives. Avocados.  Beverages Caffeinated  beverages (such as coffee, tea, soda, or energy drinks). Alcoholic beverages. Fruit juices with pulp. Prune juice. Soft drinks sweetened with high-fructose corn syrup or sugar alcohols. Other Coconut. Hot sauce. Chili powder. Mayonnaise. Gravy. Cream-based or milk-based soups.  The items listed above may not be a complete list of foods and beverages to avoid. Contact your dietitian for more information. WHAT SHOULD I DO IF I BECOME DEHYDRATED? Diarrhea can sometimes lead to dehydration. Signs of dehydration include dark urine and dry mouth and skin. If you think you are dehydrated, you should rehydrate with an oral rehydration solution. These solutions can be purchased at pharmacies, retail stores, or online.  Drink -1 cup (120-240 mL) of oral rehydration solution each time you have an episode of diarrhea. If drinking this amount makes your diarrhea worse, try drinking smaller amounts more often. For example, drink 1-3 tsp (5-15 mL) every 5-10 minutes.  A general rule for staying hydrated is to drink 1-2 L of fluid per day. Talk to your health care provider about the specific amount you should be drinking each day. Drink enough fluids to keep your urine clear or pale yellow. Document Released: 10/18/2003 Document Revised: 08/02/2013 Document Reviewed: 06/20/2013 [facility] Patient Information 2015 [place], [place]. This information is not intended to replace advice given to you by your health care provider. Make sure you discuss any questions you have with your health care provider.

## 2015-03-23 ENCOUNTER — Other Ambulatory Visit: Payer: Self-pay | Admitting: *Deleted

## 2015-03-26 ENCOUNTER — Other Ambulatory Visit: Payer: Self-pay | Admitting: Obstetrics and Gynecology

## 2015-03-26 LAB — OVA AND PARASITE EXAMINATION: OP: NONE SEEN

## 2015-03-27 LAB — CYTOLOGY - PAP

## 2015-03-28 LAB — STOOL CULTURE

## 2015-04-02 ENCOUNTER — Telehealth: Payer: Self-pay | Admitting: Family Medicine

## 2015-04-02 NOTE — Telephone Encounter (Signed)
Patient dropped off FMLA forms on 03/29/2015. They've already been completed. Just sign and date and return to [place]. Placed in your box on 04/02/2015  [facility]

## 2015-04-04 NOTE — Telephone Encounter (Signed)
FMLA forms received. Patient notified by VM that her forms are available for pick up at the walk in center. Copy of forms scanned to chart.

## 2015-04-11 ENCOUNTER — Ambulatory Visit (INDEPENDENT_AMBULATORY_CARE_PROVIDER_SITE_OTHER): Payer: BLUE CROSS/BLUE SHIELD | Admitting: Psychology

## 2015-04-11 DIAGNOSIS — F4322 Adjustment disorder with anxiety: Secondary | ICD-10-CM | POA: Diagnosis not present

## 2015-04-13 ENCOUNTER — Telehealth: Payer: Self-pay | Admitting: Family Medicine

## 2015-04-13 NOTE — Telephone Encounter (Signed)
Okay to schedule with Dr Sherren Mocha

## 2015-04-13 NOTE — Telephone Encounter (Signed)
Pt would like to know if she can get a cpe in Oct/nov? Pt will need her meds by October. Dr Sherren Mocha first cpe is mid Jan. pls advise   Pt would like you to know the only reason she has appt w/ [facility] in 05/2016, she thought dr todd would be gone. But if todd is here, she wants to stay w/ him.

## 2015-04-20 NOTE — Telephone Encounter (Signed)
Pt is at work and will call back to sch cpx

## 2015-05-03 ENCOUNTER — Other Ambulatory Visit: Payer: Self-pay | Admitting: Family Medicine

## 2015-05-04 DIAGNOSIS — Z0271 Encounter for disability determination: Secondary | ICD-10-CM

## 2015-05-15 ENCOUNTER — Other Ambulatory Visit: Payer: Self-pay

## 2015-05-22 ENCOUNTER — Encounter: Payer: Self-pay | Admitting: Family Medicine

## 2015-05-22 ENCOUNTER — Ambulatory Visit (INDEPENDENT_AMBULATORY_CARE_PROVIDER_SITE_OTHER): Payer: BLUE CROSS/BLUE SHIELD | Admitting: Family Medicine

## 2015-05-22 VITALS — BP 130/90 | HR 78 | Temp 98.2°F | Ht 68.0 in | Wt 223.0 lb

## 2015-05-22 DIAGNOSIS — F329 Major depressive disorder, single episode, unspecified: Secondary | ICD-10-CM

## 2015-05-22 DIAGNOSIS — I10 Essential (primary) hypertension: Secondary | ICD-10-CM

## 2015-05-22 DIAGNOSIS — E663 Overweight: Secondary | ICD-10-CM | POA: Diagnosis not present

## 2015-05-22 DIAGNOSIS — Z0000 Encounter for general adult medical examination without abnormal findings: Secondary | ICD-10-CM

## 2015-05-22 DIAGNOSIS — E785 Hyperlipidemia, unspecified: Secondary | ICD-10-CM | POA: Diagnosis not present

## 2015-05-22 DIAGNOSIS — G43119 Migraine with aura, intractable, without status migrainosus: Secondary | ICD-10-CM

## 2015-05-22 DIAGNOSIS — Z23 Encounter for immunization: Secondary | ICD-10-CM | POA: Diagnosis not present

## 2015-05-22 DIAGNOSIS — F32A Depression, unspecified: Secondary | ICD-10-CM

## 2015-05-22 MED ORDER — CITALOPRAM HYDROBROMIDE 10 MG PO TABS: 10.0000 mg | ORAL_TABLET | Freq: Every day | ORAL | Status: DC

## 2015-05-22 MED ORDER — SIMVASTATIN 20 MG PO TABS: ORAL_TABLET | ORAL | Status: DC

## 2015-05-22 MED ORDER — ATENOLOL 50 MG PO TABS: 50.0000 mg | ORAL_TABLET | Freq: Every day | ORAL | Status: DC

## 2015-05-22 MED ORDER — RIZATRIPTAN BENZOATE 10 MG PO TBDP: 10.0000 mg | ORAL_TABLET | ORAL | Status: DC | PRN

## 2015-05-22 NOTE — Progress Notes (Signed)
   Subjective:    Patient ID: Rachel Gates, female    DOB: [birth year], 59 y.o.   MRN: [id number]  HPI  Rachel Gates is a 59 year old married female nonsmoker who comes in today for general physical examination because of a history of hypertension, depression, allergic rhinitis, migraine headaches, hyperlipidemia, and obesity  She recently restarted going to the weight loss clinic in [facility] is lost 3 pounds  She takes Tenormin 50 mg for hypertension BP at home 120/80  She takes Celexa 10 mg daily for mild depression, Claritin 10 mg for allergic rhinitis, Maxalt 10 when necessary for migraines, and Zocor 20 mg and an aspirin tablet daily because of hyperlipidemia  She had her uterus removed 20 years ago for nonmalignant reasons. She gets an annual pelvic and Pap by her GYN for reasons I'm not sure.  Vaccination history tetanus 2014 she is given a flu shot today and a Pneumovax 13. She'll call insurance copy to find out where she can get her shingles vaccine the cheapest   Review of Systems  Constitutional: Negative.   HENT: Negative.   Eyes: Negative.   Respiratory: Negative.   Cardiovascular: Negative.   Gastrointestinal: Negative.   Endocrine: Negative.   Genitourinary: Negative.   Musculoskeletal: Negative.   Skin: Negative.   Allergic/Immunologic: Negative.   Neurological: Negative.   Hematological: Negative.   Psychiatric/Behavioral: Negative.        Objective:   Physical Exam  Constitutional: She is oriented to person, place, and time. She appears well-developed and well-nourished.  HENT:  Head: Normocephalic and atraumatic.  Right Ear: External ear normal.  Left Ear: External ear normal.  Nose: Nose normal.  Mouth/Throat: Oropharynx is clear and moist.  Eyes: EOM are normal. Pupils are equal, round, and reactive to light.  Neck: Normal range of motion. Neck supple. No JVD present. No tracheal deviation present. No thyromegaly present.  Cardiovascular: Normal rate,  regular rhythm, normal heart sounds and intact distal pulses.  Exam reveals no gallop and no friction rub.   No murmur heard. Pulmonary/Chest: Effort normal and breath sounds normal. No stridor. No respiratory distress. She has no wheezes. She has no rales. She exhibits no tenderness.  Abdominal: Soft. Bowel sounds are normal. She exhibits no distension and no mass. There is no tenderness. There is no rebound and no guarding.  Genitourinary:  Bilateral breast exam normal  Musculoskeletal: Normal range of motion.  Lymphadenopathy:    She has no cervical adenopathy.  Neurological: She is alert and oriented to person, place, and time. She has normal reflexes. No cranial nerve deficit. She exhibits normal muscle tone. Coordination normal.  Skin: Skin is warm and dry. No rash noted. No erythema. No pallor.  Psychiatric: She has a normal mood and affect. Her behavior is normal. Judgment and thought content normal.  Nursing note and vitals reviewed.         Assessment & Plan:  Hypertension at goal..... Continue current therapy  Hyperlipidemia goal,,,,,,, continue current therapy  History of depression,,,, continue Celexa 10 mg at bedtime  History of migraine headaches,,,,,,,, Maxalt 10 mg when necessary  Obesity,,,,,,,,, continue diet and exercise at the weight loss clinic

## 2015-05-22 NOTE — Patient Instructions (Signed)
Continue current medications  Most importantly continue diet and exercise program  Follow-up in one year sooner if any problems  Rachel Gates,,,,,,,,,,,, our new adult nurse practitioner from [facility]

## 2015-05-23 ENCOUNTER — Ambulatory Visit: Payer: BLUE CROSS/BLUE SHIELD | Admitting: Psychology

## 2015-09-26 ENCOUNTER — Ambulatory Visit (INDEPENDENT_AMBULATORY_CARE_PROVIDER_SITE_OTHER): Payer: BLUE CROSS/BLUE SHIELD | Admitting: Family Medicine

## 2015-09-26 VITALS — BP 122/72 | HR 80 | Temp 98.3°F | Resp 17 | Ht 68.0 in | Wt 225.0 lb

## 2015-09-26 DIAGNOSIS — R5381 Other malaise: Secondary | ICD-10-CM | POA: Diagnosis not present

## 2015-09-26 DIAGNOSIS — R5382 Chronic fatigue, unspecified: Secondary | ICD-10-CM | POA: Diagnosis not present

## 2015-09-26 DIAGNOSIS — R5383 Other fatigue: Secondary | ICD-10-CM | POA: Diagnosis not present

## 2015-09-26 DIAGNOSIS — R52 Pain, unspecified: Secondary | ICD-10-CM

## 2015-09-26 LAB — POCT CBC
Granulocyte percent: 60.1 %G (ref 37–80)
HCT, POC: 43.6 % (ref 37.7–47.9)
HEMOGLOBIN: 14.9 g/dL (ref 12.2–16.2)
LYMPH, POC: 2.7 (ref 0.6–3.4)
MCH: 31.3 pg — AB (ref 27–31.2)
MCHC: 34.2 g/dL (ref 31.8–35.4)
MCV: 91.5 fL (ref 80–97)
MID (cbc): 0.7 (ref 0–0.9)
MPV: 7.9 fL (ref 0–99.8)
POC GRANULOCYTE: 5 (ref 2–6.9)
POC LYMPH PERCENT: 31.6 %L (ref 10–50)
POC MID %: 8.3 % (ref 0–12)
Platelet Count, POC: 275 10*3/uL (ref 142–424)
RBC: 4.76 M/uL (ref 4.04–5.48)
RDW, POC: 13 %
WBC: 8.4 10*3/uL (ref 4.6–10.2)

## 2015-09-26 LAB — POCT INFLUENZA A/B
Influenza A, POC: NEGATIVE
Influenza B, POC: NEGATIVE

## 2015-09-26 MED ORDER — OSELTAMIVIR PHOSPHATE 75 MG PO CAPS: 75.0000 mg | ORAL_CAPSULE | Freq: Two times a day (BID) | ORAL | Status: DC

## 2015-09-26 NOTE — Progress Notes (Signed)
[facility] [address]  [place] [address] [phone]  Date:  09/26/2015   Name:  Dannon Martinique   DOB:  05-11-1956   MRN:  [id number]  PCP:  Joycelyn Man, MD    Chief Complaint: Sinusitis; Nasal Congestion; and Ear Pain   History of Present Illness:  Kiondra Martinique is a 60 y.o. very pleasant female patient who presents with the following:  She notes onset of sx yesterday with congestion, eyes draining and drainage from her nose.  She also noted pressure in her ears and scratchy throat.  She has a dry cough She feels tired and achy.  "I have no energy and no appeitte."    No belly pain No vomiting or diarrhea She has been exposed to several ill family members- none actually dx with flu however  She did have a temp up to 101 last night- she has been taking ibuprofen and last dose was this morning Patient Active Problem List   Diagnosis Date Noted  . Migraine headache with aura 10/03/2014  . Acute abdominal pain 12/28/2012  . 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 Seborrheic keratosis, inflamed 04/27/2012  . BENIGN POSITIONAL VERTIGO 05/22/2009  . Essential hypertension 07/03/2008  . Overweight 01/24/2008  . Hyperlipidemia 01/08/2007  . Anxiety state 01/08/2007  . Depression 01/08/2007  . ALLERGIC RHINITIS 01/08/2007    Past Medical History  Diagnosis Date  . MHA (microangiopathic hemolytic anemia) (HCC)   . Allergy   . Hyperlipidemia   . Anxiety   . Depression   . Hypertension   . Obese     Past Surgical History  Procedure Laterality Date  . Abdominal hysterectomy    . Cholecystectomy      Social History  Substance Use Topics  . Smoking status: Never Smoker   . Smokeless tobacco: Never Used  . Alcohol Use: No    Family History  Problem Relation Age of Onset  . Hypertension Mother   . Cancer Mother     uterine  . Angina Mother   . Heart attack Father   . Glaucoma Father     No Known Allergies  Medication list has been  reviewed and updated.  Current Outpatient Prescriptions on File Prior to Visit  Medication Sig Dispense Refill  . aspirin 81 MG EC tablet Take 81 mg by mouth daily.      Marland Kitchen atenolol (TENORMIN) 50 MG tablet Take 1 tablet (50 mg total) by mouth daily. 90 tablet 3  . calcium carbonate (OS-CAL) 600 MG TABS Take 600 mg by mouth 2 (two) times daily with a meal.      . citalopram (CELEXA) 10 MG tablet Take 1 tablet (10 mg total) by mouth daily. 90 tablet 3  . fish oil-omega-3 fatty acids 1000 MG capsule Take 1 g by mouth daily.      Marland Kitchen HYDROcodone-acetaminophen (NORCO/VICODIN) 5-325 MG per tablet Take 1 tablet by mouth every 8 (eight) hours as needed for moderate pain. 30 tablet 0  . loratadine (CLARITIN) 10 MG tablet Take 10 mg by mouth daily.    . Multiple Vitamin (MULTIVITAMIN) tablet Take 1 tablet by mouth daily.      . ondansetron (ZOFRAN ODT) 4 MG disintegrating tablet Take 1 tablet (4 mg total) by mouth every 8 (eight) hours as needed for nausea or vomiting. 20 tablet 0  . simvastatin (ZOCOR) 20 MG tablet TAKE 1 TABLET (20 MG TOTAL) BY MOUTH AT BEDTIME. 90 tablet 3   No current facility-administered  medications on file prior to visit.    Review of Systems:  As per HPI- otherwise negative.   Physical Examination: Filed Vitals:   09/26/15 1200  BP: 122/72  Pulse: 80  Temp: 98.3 F (36.8 C)  Resp: 17   Filed Vitals:   09/26/15 1200  Height: 5\' 8"  (1.727 m)  Weight: 225 lb (102.059 kg)   Body mass index is 34.22 kg/(m^2). Ideal Body Weight: Weight in (lb) to have BMI = 25: 164.1  GEN: WDWN, NAD, Non-toxic, A & O x 3, overweight HEENT: Atraumatic, Normocephalic. Neck supple. No masses, No LAD.  Bilateral TM wnl, oropharynx normal.  PEERL,EOMI.   Ears and Nose: No external deformity. CV: RRR, No M/G/R. No JVD. No thrill. No extra heart sounds. PULM: CTA B, no wheezes, crackles, rhonchi. No retractions. No resp. distress. No accessory muscle use. ABD: S, NT, ND EXTR: No  c/c/e NEURO Normal gait.  PSYCH: Normally interactive. Conversant. Not depressed or anxious appearing.  Calm demeanor.   Results for orders placed or performed in visit on 09/26/15  POCT Influenza A/B  Result Value Ref Range   Influenza A, POC Negative Negative   Influenza B, POC Negative Negative  POCT CBC  Result Value Ref Range   WBC 8.4 4.6 - 10.2 K/uL   Lymph, poc 2.7 0.6 - 3.4   POC LYMPH PERCENT 31.6 10 - 50 %L   MID (cbc) 0.7 0 - 0.9   POC MID % 8.3 0 - 12 %M   POC Granulocyte 5.0 2 - 6.9   Granulocyte percent 60.1 37 - 80 %G   RBC 4.76 4.04 - 5.48 M/uL   Hemoglobin 14.9 12.2 - 16.2 g/dL   HCT, POC 43.6 37.7 - 47.9 %   MCV 91.5 80 - 97 fL   MCH, POC 31.3 (A) 27 - 31.2 pg   MCHC 34.2 31.8 - 35.4 g/dL   RDW, POC 13.0 %   Platelet Count, POC 275 142 - 424 K/uL   MPV 7.9 0 - 99.8 fL    Assessment and Plan: Body aches - Plan: POCT Influenza A/B, POCT CBC, oseltamivir (TAMIFLU) 75 MG capsule  Malaise and fatigue - Plan: POCT CBC, oseltamivir (TAMIFLU) 75 MG capsule  Here today with illness- suspect flu She would like to start on tamiflu.   Otherwise supportive care She will follow-up if not better in a few days, Sooner if worse.     Signed Lamar Blinks, MD

## 2015-09-26 NOTE — Patient Instructions (Signed)
You may well have the flu Rest, fluids, ibuprofen and/ or tylenol as needed Start the tamiflu and take it twice a day for 5 days Let us know if you are getting worse or if you are not feeling better in the next few days!

## 2015-11-21 DIAGNOSIS — K1379 Other lesions of oral mucosa: Secondary | ICD-10-CM | POA: Diagnosis not present

## 2016-02-22 DIAGNOSIS — M7062 Trochanteric bursitis, left hip: Secondary | ICD-10-CM | POA: Diagnosis not present

## 2016-02-22 DIAGNOSIS — M7061 Trochanteric bursitis, right hip: Secondary | ICD-10-CM | POA: Diagnosis not present

## 2016-02-25 DIAGNOSIS — Z791 Long term (current) use of non-steroidal anti-inflammatories (NSAID): Secondary | ICD-10-CM | POA: Diagnosis not present

## 2016-03-08 DIAGNOSIS — M7062 Trochanteric bursitis, left hip: Secondary | ICD-10-CM | POA: Diagnosis not present

## 2016-03-08 DIAGNOSIS — R0602 Shortness of breath: Secondary | ICD-10-CM | POA: Diagnosis not present

## 2016-03-08 DIAGNOSIS — M7061 Trochanteric bursitis, right hip: Secondary | ICD-10-CM | POA: Diagnosis not present

## 2016-03-08 DIAGNOSIS — R635 Abnormal weight gain: Secondary | ICD-10-CM | POA: Diagnosis not present

## 2016-03-31 DIAGNOSIS — R0602 Shortness of breath: Secondary | ICD-10-CM | POA: Diagnosis not present

## 2016-03-31 DIAGNOSIS — R635 Abnormal weight gain: Secondary | ICD-10-CM | POA: Diagnosis not present

## 2016-04-04 DIAGNOSIS — R0602 Shortness of breath: Secondary | ICD-10-CM | POA: Diagnosis not present

## 2016-04-09 DIAGNOSIS — J029 Acute pharyngitis, unspecified: Secondary | ICD-10-CM | POA: Diagnosis not present

## 2016-04-16 DIAGNOSIS — M7062 Trochanteric bursitis, left hip: Secondary | ICD-10-CM | POA: Diagnosis not present

## 2016-04-16 DIAGNOSIS — M7061 Trochanteric bursitis, right hip: Secondary | ICD-10-CM | POA: Diagnosis not present

## 2016-04-16 LAB — CBC AND DIFFERENTIAL
HCT: 42 % (ref 36–46)
HEMOGLOBIN: 14.2 g/dL (ref 12.0–16.0)
NEUTROS ABS: 56 /uL
WBC: 9.1 10*3/mL

## 2016-04-16 LAB — HEPATIC FUNCTION PANEL: Bilirubin, Direct: 0.6 mg/dL — AB (ref 0.01–0.4)

## 2016-04-16 LAB — BASIC METABOLIC PANEL
BUN: 21 mg/dL (ref 4–21)
Creatinine: 0.7 mg/dL (ref 0.5–1.1)
Glucose: 97 mg/dL
POTASSIUM: 4.8 mmol/L (ref 3.4–5.3)
Sodium: 146 mmol/L (ref 137–147)

## 2016-04-16 LAB — LIPID PANEL
CHOLESTEROL: 156 mg/dL (ref 0–200)
HDL: 30 mg/dL — AB (ref 35–70)
LDL Cholesterol: 83 mg/dL
Triglycerides: 215 mg/dL — AB (ref 40–160)

## 2016-04-16 LAB — TSH: TSH: 2.51 u[IU]/mL (ref 0.41–5.90)

## 2016-05-01 DIAGNOSIS — Z6834 Body mass index (BMI) 34.0-34.9, adult: Secondary | ICD-10-CM | POA: Diagnosis not present

## 2016-05-01 DIAGNOSIS — Z1231 Encounter for screening mammogram for malignant neoplasm of breast: Secondary | ICD-10-CM | POA: Diagnosis not present

## 2016-05-01 DIAGNOSIS — Z13820 Encounter for screening for osteoporosis: Secondary | ICD-10-CM | POA: Diagnosis not present

## 2016-05-01 DIAGNOSIS — Z01419 Encounter for gynecological examination (general) (routine) without abnormal findings: Secondary | ICD-10-CM | POA: Diagnosis not present

## 2016-05-03 DIAGNOSIS — R635 Abnormal weight gain: Secondary | ICD-10-CM | POA: Diagnosis not present

## 2016-05-03 DIAGNOSIS — E669 Obesity, unspecified: Secondary | ICD-10-CM | POA: Diagnosis not present

## 2016-05-06 LAB — HM MAMMOGRAPHY

## 2016-05-12 ENCOUNTER — Encounter: Payer: Self-pay | Admitting: Family Medicine

## 2016-05-12 ENCOUNTER — Ambulatory Visit (INDEPENDENT_AMBULATORY_CARE_PROVIDER_SITE_OTHER): Payer: BLUE CROSS/BLUE SHIELD | Admitting: Family Medicine

## 2016-05-12 VITALS — BP 124/86 | HR 75 | Temp 97.9°F | Ht 67.0 in | Wt 222.0 lb

## 2016-05-12 DIAGNOSIS — Z23 Encounter for immunization: Secondary | ICD-10-CM | POA: Diagnosis not present

## 2016-05-12 DIAGNOSIS — Z1211 Encounter for screening for malignant neoplasm of colon: Secondary | ICD-10-CM | POA: Diagnosis not present

## 2016-05-12 DIAGNOSIS — I10 Essential (primary) hypertension: Secondary | ICD-10-CM | POA: Diagnosis not present

## 2016-05-12 DIAGNOSIS — E785 Hyperlipidemia, unspecified: Secondary | ICD-10-CM

## 2016-05-12 DIAGNOSIS — F3342 Major depressive disorder, recurrent, in full remission: Secondary | ICD-10-CM | POA: Diagnosis not present

## 2016-05-12 DIAGNOSIS — G43009 Migraine without aura, not intractable, without status migrainosus: Secondary | ICD-10-CM

## 2016-05-12 DIAGNOSIS — G43109 Migraine with aura, not intractable, without status migrainosus: Secondary | ICD-10-CM

## 2016-05-12 DIAGNOSIS — M7070 Other bursitis of hip, unspecified hip: Secondary | ICD-10-CM | POA: Insufficient documentation

## 2016-05-12 MED ORDER — SIMVASTATIN 20 MG PO TABS: ORAL_TABLET | ORAL | 3 refills | Status: DC

## 2016-05-12 MED ORDER — HYDROCODONE-ACETAMINOPHEN 5-325 MG PO TABS: 1.0000 | ORAL_TABLET | Freq: Three times a day (TID) | ORAL | 0 refills | Status: DC | PRN

## 2016-05-12 MED ORDER — ATENOLOL 50 MG PO TABS: 50.0000 mg | ORAL_TABLET | Freq: Every day | ORAL | 3 refills | Status: DC

## 2016-05-12 MED ORDER — CITALOPRAM HYDROBROMIDE 10 MG PO TABS: 10.0000 mg | ORAL_TABLET | Freq: Every day | ORAL | 3 refills | Status: DC

## 2016-05-12 NOTE — Assessment & Plan Note (Signed)
S:Celexa 10mg - long term and has done well. PHQ2 0. Has had anxiety element in past but not primary issue- was related to issue with daughter which has now resolved A/P: continue current meds

## 2016-05-12 NOTE — Assessment & Plan Note (Signed)
S: reasonably controlled on simvastatin 20mg . No myalgias.  Lab Results  Component Value Date   CHOL 156 04/16/2016   HDL 30 (A) 04/16/2016   LDLCALC 83 04/16/2016   LDLDIRECT 106.5 01/06/2007   TRIG 215 (A) 04/16/2016   CHOLHDL 6.3 CALC 01/20/2008   A/P: push for weight loss to further help with triglycerides, increase exercise when hip is better to help with HDL

## 2016-05-12 NOTE — Progress Notes (Signed)
Pre visit review using our clinic review tool, if applicable. No additional management support is needed unless otherwise documented below in the visit note. 

## 2016-05-12 NOTE — Assessment & Plan Note (Signed)
S:Maxalt causes confusion. Excedrin migraine now- then back up vicodin and sleep (started by Dr. Sherren Mocha). HA about every  6-8 weeks while on atenolol for BP and prophylaxis A/P: continue excedrin migraine prn (not while on mobic). DId give #10 of vicodin when prior #30 lasted her 2 years- still with 2 pills left

## 2016-05-12 NOTE — Patient Instructions (Addendum)
Sign release of information at the check out desk for last mammogram and bone density.   No changes today

## 2016-05-12 NOTE — Assessment & Plan Note (Signed)
S: controlled. On Atenolol 50mg . Nadolol in the past apparently along with atenolol now just on atenolol.  BP Readings from Last 3 Encounters:  05/12/16 124/86  09/26/15 122/72  05/22/15 130/90  A/P:Continue current meds:  Doing well

## 2016-05-12 NOTE — Progress Notes (Signed)
Phone: [phone]  Subjective:  Patient presents today to establish care with me as their new primary care provider. Patient was formerly a patient of Dr. Sherren Mocha. Chief complaint-noted.   See problem oriented charting- ROS- No chest pain or shortness of breath. No blurry vision. No fever. No SI/HI  The following were reviewed and entered/updated in epic: Past Medical History:  Diagnosis Date  . Allergy   . Anxiety   . Depression   . Hyperlipidemia   . Hypertension   . Obese    Patient Active Problem List   Diagnosis Date Noted  . Migraine headache with aura 10/03/2014    Priority: Medium  . Essential hypertension 07/03/2008    Priority: Medium  . Hyperlipidemia 01/08/2007    Priority: Medium  . Depression 01/08/2007    Priority: Medium  . Hip bursitis 05/12/2016    Priority: Low  . 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 BENIGN POSITIONAL VERTIGO 05/22/2009    Priority: Low  . Obesity 01/24/2008    Priority: Low  . Allergic rhinitis 01/08/2007    Priority: Low   Past Surgical History:  Procedure Laterality Date  . ABDOMINAL HYSTERECTOMY    . carpal tunnel surgery bilaterally    . CHOLECYSTECTOMY    . left foot fascia release    . parotid gland surgery right side      Family History  Problem Relation Age of Onset  . Hypertension Mother   . Angina Mother     no stents  . Uterine cancer Mother   . Alzheimer's disease Mother     in facility since 2016  . Heart attack Father     mid 77s  . Glaucoma Father     Medications- reviewed and updated Current Outpatient Prescriptions  Medication Sig Dispense Refill  . aspirin 81 MG EC tablet Take 81 mg by mouth daily.      Marland Kitchen atenolol (TENORMIN) 50 MG tablet Take 1 tablet (50 mg total) by mouth daily. 90 tablet 3  . calcium carbonate (OS-CAL) 600 MG TABS Take 600 mg by mouth 2 (two) times daily with a meal.      . citalopram (CELEXA) 10 MG tablet Take 1 tablet (10 mg total) by mouth daily. 90 tablet 3  .  fish oil-omega-3 fatty acids 1000 MG capsule Take 1 g by mouth daily.      Marland Kitchen HYDROcodone-acetaminophen (NORCO/VICODIN) 5-325 MG per tablet Take 1 tablet by mouth every 8 (eight) hours as needed for moderate pain. 30 tablet 0  . loratadine (CLARITIN) 10 MG tablet Take 10 mg by mouth daily.    . Multiple Vitamin (MULTIVITAMIN) tablet Take 1 tablet by mouth daily.      . simvastatin (ZOCOR) 20 MG tablet TAKE 1 TABLET (20 MG TOTAL) BY MOUTH AT BEDTIME. 90 tablet 3     Allergies-reviewed and updated No Known Allergies  Social History   Social History  . Marital status: Married    Spouse name: Thomas Martinique  . Number of children: 3  . Years of education: 12+   Occupational History  . 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 Smoking status: Never Smoker  . Smokeless tobacco: Never Used  . Alcohol use No  . Drug use: No  . Sexual activity: Not Asked   Other Topics Concern  . None   Social History Narrative   Lives with her husband. Their adult children live nearby- 3 children. 3 grandkids.  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 Oropharynx normal. TM normal CV: RRR no murmurs rubs or gallops Lungs: CTAB no crackles, wheeze, rhonchi Abdomen: soft/nontender/nondistended/normal bowel sounds. No rebound or guarding.  Ext: no edema Skin: warm, dry Neuro: grossly normal, moves all extremities, PERRLA  Assessment/Plan:  Migraine headache with aura S:Maxalt causes confusion. Excedrin migraine now- then back up vicodin and sleep (started by Dr. Sherren Mocha).  HA about every  6-8 weeks while on atenolol for BP and prophylaxis A/P: continue excedrin migraine prn (not while on mobic). DId give #10 of vicodin when prior #30 lasted her 2 years- still with 2 pills left  Hyperlipidemia S: reasonably controlled on simvastatin 20mg . No myalgias.  Lab Results  Component Value Date   CHOL 156 04/16/2016   HDL 30 (A) 04/16/2016   LDLCALC 83 04/16/2016   LDLDIRECT 106.5 01/06/2007   TRIG 215 (A) 04/16/2016   CHOLHDL 6.3 CALC 01/20/2008   A/P: push for weight loss to further help with triglycerides, increase exercise when hip is better to help with HDL   Essential hypertension S: controlled. On Atenolol 50mg . Nadolol in the past apparently along with atenolol now just on atenolol.  BP Readings from Last 3 Encounters:  05/12/16 124/86  09/26/15 122/72  05/22/15 130/90  A/P:Continue current meds:  Doing well   Depression S:Celexa 10mg - long term and has done well. PHQ2 0. Has had anxiety element in past but not primary issue- was related to issue with daughter which has now resolved A/P: continue current meds   Does CPE with GYN- requesting DEXA and mammogram.  Last colonoscopy 2007- requests [facility] in [place] Return in about 1 year (around 05/12/2017) for follow up- or sooner if needed.  Orders Placed This Encounter  Procedures  . Flu Vaccine QUAD 36+ mos IM  . Ambulatory referral to Gastroenterology    Referral Priority:   Routine    Referral Type:   Consultation    Referral Reason:   Specialty Services Required    Number of Visits Requested:   1    Meds ordered this encounter  Medications  . citalopram (CELEXA) 10 MG tablet    Sig: Take 1 tablet (10 mg total) by mouth daily.    Dispense:  91 tablet    Refill:  3  . atenolol (TENORMIN) 50 MG tablet    Sig: Take 1 tablet (50 mg total) by mouth daily.    Dispense:  91 tablet    Refill:  3  . HYDROcodone-acetaminophen (NORCO/VICODIN) 5-325 MG tablet    Sig: Take 1 tablet by  mouth every 8 (eight) hours as needed for moderate pain.    Dispense:  10 tablet    Refill:  0  . simvastatin (ZOCOR) 20 MG tablet    Sig: TAKE 1 TABLET (20 MG TOTAL) BY MOUTH AT BEDTIME.    Dispense:  91 tablet    Refill:  3    1 tablet daily at bedtime    Return precautions  advised.   Garret Reddish, MD

## 2016-05-13 ENCOUNTER — Encounter: Payer: Self-pay | Admitting: Family Medicine

## 2016-05-30 DIAGNOSIS — E669 Obesity, unspecified: Secondary | ICD-10-CM | POA: Diagnosis not present

## 2016-07-08 DIAGNOSIS — R635 Abnormal weight gain: Secondary | ICD-10-CM | POA: Diagnosis not present

## 2016-09-18 DIAGNOSIS — R635 Abnormal weight gain: Secondary | ICD-10-CM | POA: Diagnosis not present

## 2016-09-18 DIAGNOSIS — Z1211 Encounter for screening for malignant neoplasm of colon: Secondary | ICD-10-CM | POA: Diagnosis not present

## 2016-09-27 DIAGNOSIS — Z20828 Contact with and (suspected) exposure to other viral communicable diseases: Secondary | ICD-10-CM | POA: Diagnosis not present

## 2016-09-27 DIAGNOSIS — J069 Acute upper respiratory infection, unspecified: Secondary | ICD-10-CM | POA: Diagnosis not present

## 2016-10-06 DIAGNOSIS — Z01818 Encounter for other preprocedural examination: Secondary | ICD-10-CM | POA: Diagnosis not present

## 2016-10-06 DIAGNOSIS — Z1211 Encounter for screening for malignant neoplasm of colon: Secondary | ICD-10-CM | POA: Diagnosis not present

## 2016-10-06 DIAGNOSIS — K635 Polyp of colon: Secondary | ICD-10-CM | POA: Diagnosis not present

## 2016-10-08 DIAGNOSIS — K529 Noninfective gastroenteritis and colitis, unspecified: Secondary | ICD-10-CM | POA: Diagnosis not present

## 2016-10-16 DIAGNOSIS — Z1211 Encounter for screening for malignant neoplasm of colon: Secondary | ICD-10-CM | POA: Diagnosis not present

## 2016-12-20 DIAGNOSIS — R635 Abnormal weight gain: Secondary | ICD-10-CM | POA: Diagnosis not present

## 2016-12-20 DIAGNOSIS — R5383 Other fatigue: Secondary | ICD-10-CM | POA: Diagnosis not present

## 2016-12-20 DIAGNOSIS — Z79899 Other long term (current) drug therapy: Secondary | ICD-10-CM | POA: Diagnosis not present

## 2016-12-20 DIAGNOSIS — R0602 Shortness of breath: Secondary | ICD-10-CM | POA: Diagnosis not present

## 2017-01-19 DIAGNOSIS — R635 Abnormal weight gain: Secondary | ICD-10-CM | POA: Diagnosis not present

## 2017-02-21 DIAGNOSIS — E7800 Pure hypercholesterolemia, unspecified: Secondary | ICD-10-CM | POA: Diagnosis not present

## 2017-02-21 DIAGNOSIS — E559 Vitamin D deficiency, unspecified: Secondary | ICD-10-CM | POA: Diagnosis not present

## 2017-02-21 DIAGNOSIS — R635 Abnormal weight gain: Secondary | ICD-10-CM | POA: Diagnosis not present

## 2017-03-08 ENCOUNTER — Encounter (HOSPITAL_COMMUNITY): Payer: Self-pay | Admitting: Emergency Medicine

## 2017-03-08 ENCOUNTER — Ambulatory Visit (HOSPITAL_COMMUNITY)
Admission: EM | Admit: 2017-03-08 | Discharge: 2017-03-08 | Disposition: A | Discharge status: Home / Self Care | Payer: BLUE CROSS/BLUE SHIELD | Attending: Internal Medicine | Admitting: Internal Medicine

## 2017-03-08 DIAGNOSIS — B029 Zoster without complications: Secondary | ICD-10-CM | POA: Diagnosis not present

## 2017-03-08 MED ORDER — PREDNISONE 50 MG PO TABS: 50.0000 mg | ORAL_TABLET | Freq: Every day | ORAL | 0 refills | Status: AC

## 2017-03-08 MED ORDER — FAMCICLOVIR 500 MG PO TABS: 500.0000 mg | ORAL_TABLET | Freq: Three times a day (TID) | ORAL | 0 refills | Status: AC

## 2017-03-08 MED ORDER — HYDROCODONE-ACETAMINOPHEN 5-325 MG PO TABS: 1.0000 | ORAL_TABLET | Freq: Four times a day (QID) | ORAL | 0 refills | Status: DC | PRN

## 2017-03-08 NOTE — Discharge Instructions (Addendum)
Anticipate gradual improvement in rash and pain over the next 2-3 weeks.  Prescriptions for famciclovir (for the shingles virus) and prednisone (steroid, for nerve inflammation/pain) were sent to the pharmacy. Prescription for vicodin was printed.  Recheck or followup with your primary care provider if not improving as expected.

## 2017-03-08 NOTE — ED Triage Notes (Signed)
The patient presented to the [facility] with a complaint of a painful rash on her left side that she believed to be shingles.

## 2017-03-08 NOTE — ED Provider Notes (Signed)
[facility]    CSN: [id number] Arrival date & time: 03/08/17  1413     History   Chief Complaint Chief Complaint  Patient presents with  . Rash    HPI Rachel Gates is a 61 y.o. female. She presents today with the onset of pain in the left low back and side starting 2 days ago. This has increased, is poorly controlled by ibuprofen. She saw her daughter last evening, she noticed that she had the beginnings of a rash in the same location. She now has additional red spots, with itching and pain. She is worried about shingles.    HPI  Past Medical History:  Diagnosis Date  . Allergy   . Anxiety   . Depression   . Hyperlipidemia   . Hypertension   . Obese     Patient Active Problem List   Diagnosis Date Noted  . Hip bursitis 05/12/2016  . Migraine headache with aura 10/03/2014  . 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 BENIGN POSITIONAL VERTIGO 05/22/2009  . Essential hypertension 07/03/2008  . Obesity 01/24/2008  . Hyperlipidemia 01/08/2007  . Depression 01/08/2007  . Allergic rhinitis 01/08/2007    Past Surgical History:  Procedure Laterality Date  . ABDOMINAL HYSTERECTOMY    . carpal tunnel surgery bilaterally    . CHOLECYSTECTOMY    . left foot fascia release    . parotid gland surgery right side      Home Medications    Prior to Admission medications   Medication Sig Start Date End Date Taking? Authorizing Provider  aspirin 81 MG EC tablet Take 81 mg by mouth daily.      [provider]  atenolol (TENORMIN) 50 MG tablet Take 1 tablet (50 mg total) by mouth daily. 05/12/16   Marin Olp, MD  calcium carbonate (OS-CAL) 600 MG TABS Take 600 mg by mouth 2 (two) times daily with a meal.      [provider]  citalopram (CELEXA) 10 MG tablet Take 1 tablet (10 mg total) by mouth daily. 05/12/16   Marin Olp, MD  famciclovir (FAMVIR) 500 MG tablet Take 1 tablet (500 mg total) by mouth 3 (three) times daily. 03/08/17 03/15/17  Sherlene Shams, MD  fish oil-omega-3 fatty acids 1000 MG capsule Take 1 g by mouth daily.      [provider]  HYDROcodone-acetaminophen (NORCO/VICODIN) 5-325 MG tablet Take 1 tablet by mouth every 8 (eight) hours as needed for moderate pain. 05/12/16   Marin Olp, MD  HYDROcodone-acetaminophen (NORCO/VICODIN) 5-325 MG tablet Take 1 tablet by mouth 4 (four) times daily as needed. 03/08/17   Sherlene Shams, MD  loratadine (CLARITIN) 10 MG tablet Take 10 mg by mouth daily.    [provider]  Multiple Vitamin (MULTIVITAMIN) tablet Take 1 tablet by mouth daily.      [provider]  predniSONE (DELTASONE) 50 MG tablet Take 1 tablet (50 mg total) by mouth daily. 03/08/17 03/13/17  Sherlene Shams, MD  simvastatin (ZOCOR) 20 MG tablet TAKE 1 TABLET (20 MG TOTAL) BY MOUTH AT BEDTIME. 05/12/16   Marin Olp, MD    Family History Family History  Problem Relation Age of Onset  . Hypertension Mother   . Angina Mother        no stents  . Uterine cancer Mother   . Alzheimer's disease Mother        in facility since 2016  . Heart attack Father  mid 34s  . Glaucoma Father     Social History Social History  Substance Use Topics  . Smoking status: Never Smoker  . Smokeless tobacco: Never Used  . Alcohol use No     Allergies   Patient has no known allergies.   Review of Systems Review of Systems  All other systems reviewed and are negative.    Physical Exam Triage Vital Signs ED Triage Vitals [03/08/17 1445]  Enc Vitals Group     BP 136/79     Pulse Rate 70     Resp 18     Temp 98.4 F (36.9 C)     Temp Source Oral     SpO2 97 %     Weight      Height      Pain Score 8     Pain Loc    Updated Vital Signs BP 136/79 (BP Location: Right Arm)   Pulse 70   Temp 98.4 F (36.9 C) (Oral)   Resp 18   SpO2 97%   Physical Exam  Constitutional: She is oriented to person, place, and time. No distress.  HENT:  Head: Atraumatic.  Eyes:    Conjugate gaze observed, no eye redness/discharge  Neck: Neck supple.  Cardiovascular: Normal rate.   Pulmonary/Chest: No respiratory distress.  Abdominal: She exhibits no distension.  Musculoskeletal: Normal range of motion.  Neurological: She is alert and oriented to person, place, and time.  Skin: Skin is warm and dry.  Clusters of red papules, some with clusters of vesicles on top, over the left low back and radiating around to the front.  Nursing note and vitals reviewed.    UC Treatments / Results   Procedures Procedures (including critical care time) None today  Final Clinical Impressions(s) / UC Diagnoses   Final diagnoses:  Herpes zoster without complication   Anticipate gradual improvement in rash and pain over the next 2-3 weeks.  Prescriptions for famciclovir (for the shingles virus) and prednisone (steroid, for nerve inflammation/pain) were sent to the pharmacy. Prescription for vicodin was printed.  Recheck or followup with your primary care provider if not improving as expected.    New Prescriptions New Prescriptions   FAMCICLOVIR (FAMVIR) 500 MG TABLET    Take 1 tablet (500 mg total) by mouth 3 (three) times daily.   HYDROCODONE-ACETAMINOPHEN (NORCO/VICODIN) 5-325 MG TABLET    Take 1 tablet by mouth 4 (four) times daily as needed.   PREDNISONE (DELTASONE) 50 MG TABLET    Take 1 tablet (50 mg total) by mouth daily.     Sherlene Shams, MD 03/09/17 1524

## 2017-03-10 ENCOUNTER — Encounter: Payer: Self-pay | Admitting: Adult Health

## 2017-03-10 ENCOUNTER — Ambulatory Visit (INDEPENDENT_AMBULATORY_CARE_PROVIDER_SITE_OTHER): Payer: BLUE CROSS/BLUE SHIELD | Admitting: Adult Health

## 2017-03-10 VITALS — BP 110/74 | Temp 97.7°F | Wt 219.0 lb

## 2017-03-10 DIAGNOSIS — B029 Zoster without complications: Secondary | ICD-10-CM

## 2017-03-10 MED ORDER — METHYLPREDNISOLONE ACETATE 80 MG/ML IJ SUSP
80.0000 mg | Freq: Once | INTRAMUSCULAR | Status: AC
  Administered 2017-03-10: 80 mg via INTRAMUSCULAR

## 2017-03-10 MED ORDER — KETOROLAC TROMETHAMINE 60 MG/2ML IM SOLN
60.0000 mg | Freq: Once | INTRAMUSCULAR | Status: AC
  Administered 2017-03-10: 60 mg via INTRAMUSCULAR

## 2017-03-10 MED ORDER — METHYLPREDNISOLONE ACETATE 80 MG/ML IJ SUSP: 80.0000 mg | Freq: Once | INTRAMUSCULAR | Status: DC

## 2017-03-10 MED ORDER — KETOROLAC TROMETHAMINE 60 MG/2ML IM SOLN: 60.0000 mg | Freq: Once | INTRAMUSCULAR | Status: AC

## 2017-03-10 NOTE — Addendum Note (Signed)
Addended by: Miles Costain T on: 03/10/2017 05:20 PM   Modules accepted: Orders

## 2017-03-10 NOTE — Progress Notes (Signed)
Subjective:    Patient ID: Rachel Gates, female    DOB: 1956-05-11, 61 y.o.   MRN: [id number]  HPI 61 year old female who  has a past medical history of Allergy; Anxiety; Depression; Hyperlipidemia; Hypertension; and Obese. She is a patient of Dr. Yong Channel who presents to the office today for an acute issue. She was diagnosed with shingles on 03/08/2017 at [facility]. She was prescribed Famvir 500mg  TID, and prednisone 50 mg x 5 days. She reports that she is unable to tolerate this dose of prednisone. She is having insomnia, nausea and vomiting and headaches.   She has been using her [place] that was prescribed but feels as though this is not helping. She added Motrin to her regimen today and found relief.    Review of Systems See HPI   Past Medical History:  Diagnosis Date  . Allergy   . Anxiety   . Depression   . Hyperlipidemia   . Hypertension   . Obese     Social History   Social History  . Marital status: Married    Spouse name: [name]  . Number of children: 3  . Years of education: 12+   Occupational History  . 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 Smoking status: Never Smoker  . Smokeless tobacco: Never Used  . Alcohol use No  . Drug use: No  . Sexual activity: Not on file   Other Topics Concern  . Not on file   Social History Narrative   Lives with her husband. Their adult children live nearby- 3 children. 3 grandkids.       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 ABDOMINAL HYSTERECTOMY    . carpal tunnel surgery bilaterally    . CHOLECYSTECTOMY    . left foot fascia release    . parotid gland surgery right side      Family History  Problem Relation Age of Onset  . Hypertension Mother   . Angina Mother        no stents  . Uterine  cancer Mother   . Alzheimer's disease Mother        in facility since 2016  . Heart attack Father        mid 105s  . Glaucoma Father     No Known Allergies  Current Outpatient Prescriptions on File Prior to Visit  Medication Sig Dispense Refill  . aspirin 81 MG EC tablet Take 81 mg by mouth daily.      Marland Kitchen atenolol (TENORMIN) 50 MG tablet Take 1 tablet (50 mg total) by mouth daily. 91 tablet 3  . calcium carbonate (OS-CAL) 600 MG TABS Take 600 mg by mouth 2 (two) times daily with a meal.      . citalopram (CELEXA) 10 MG tablet Take 1 tablet (10 mg total) by mouth daily. 91 tablet 3  . famciclovir (FAMVIR) 500 MG tablet Take 1 tablet (500 mg total) by mouth 3 (three) times daily. 21 tablet 0  . fish oil-omega-3 fatty acids 1000 MG capsule Take 1 g by mouth daily.      Marland Kitchen HYDROcodone-acetaminophen (NORCO/VICODIN) 5-325 MG tablet Take 1 tablet by mouth every 8 (eight) hours as needed for moderate pain. 10 tablet 0  .  HYDROcodone-acetaminophen (NORCO/VICODIN) 5-325 MG tablet Take 1 tablet by mouth 4 (four) times daily as needed. 30 tablet 0  . loratadine (CLARITIN) 10 MG tablet Take 10 mg by mouth daily.    . Multiple Vitamin (MULTIVITAMIN) tablet Take 1 tablet by mouth daily.      . predniSONE (DELTASONE) 50 MG tablet Take 1 tablet (50 mg total) by mouth daily. 5 tablet 0  . simvastatin (ZOCOR) 20 MG tablet TAKE 1 TABLET (20 MG TOTAL) BY MOUTH AT BEDTIME. 91 tablet 3   No current facility-administered medications on file prior to visit.     Temp 97.7 F (36.5 C) (Oral)   Wt 219 lb (99.3 kg)   BMI 34.30 kg/m       Objective:   Physical Exam  Constitutional: She is oriented to person, place, and time. She appears well-developed and well-nourished. No distress.  Appears in pain    Cardiovascular: Normal rate, regular rhythm, normal heart sounds and intact distal pulses.  Exam reveals no gallop and no friction rub.   No murmur heard. Pulmonary/Chest: Effort normal and breath sounds  normal. No respiratory distress. She has no wheezes. She has no rales. She exhibits no tenderness.  Neurological: She is alert and oriented to person, place, and time.  Skin: Skin is warm and dry. Rash (vesicular rash noted on left flank ) noted. She is not diaphoretic. No erythema. No pallor.  Psychiatric: She has a normal mood and affect. Her behavior is normal. Judgment and thought content normal.  Nursing note and vitals reviewed.     Assessment & Plan:  1. Herpes zoster without complication - methylPREDNISolone acetate (DEPO-MEDROL) injection 80 mg; Inject 1 mL (80 mg total) into the muscle once. - ketorolac (TORADOL) injection 60 mg; Inject 2 mLs (60 mg total) into the muscle once. - Follow up if no improvement   Dorothyann Peng, NP

## 2017-03-26 DIAGNOSIS — E7800 Pure hypercholesterolemia, unspecified: Secondary | ICD-10-CM | POA: Diagnosis not present

## 2017-03-26 DIAGNOSIS — E559 Vitamin D deficiency, unspecified: Secondary | ICD-10-CM | POA: Diagnosis not present

## 2017-03-26 DIAGNOSIS — R635 Abnormal weight gain: Secondary | ICD-10-CM | POA: Diagnosis not present

## 2017-04-30 ENCOUNTER — Ambulatory Visit (INDEPENDENT_AMBULATORY_CARE_PROVIDER_SITE_OTHER): Payer: BLUE CROSS/BLUE SHIELD | Admitting: Family Medicine

## 2017-04-30 ENCOUNTER — Encounter: Payer: Self-pay | Admitting: Family Medicine

## 2017-04-30 VITALS — BP 122/78 | HR 76 | Temp 97.9°F | Ht 67.0 in | Wt 218.0 lb

## 2017-04-30 DIAGNOSIS — Z0000 Encounter for general adult medical examination without abnormal findings: Secondary | ICD-10-CM | POA: Diagnosis not present

## 2017-04-30 DIAGNOSIS — F3342 Major depressive disorder, recurrent, in full remission: Secondary | ICD-10-CM | POA: Diagnosis not present

## 2017-04-30 DIAGNOSIS — G43109 Migraine with aura, not intractable, without status migrainosus: Secondary | ICD-10-CM | POA: Diagnosis not present

## 2017-04-30 DIAGNOSIS — I10 Essential (primary) hypertension: Secondary | ICD-10-CM | POA: Diagnosis not present

## 2017-04-30 DIAGNOSIS — E785 Hyperlipidemia, unspecified: Secondary | ICD-10-CM

## 2017-04-30 DIAGNOSIS — Z23 Encounter for immunization: Secondary | ICD-10-CM

## 2017-04-30 NOTE — Patient Instructions (Addendum)
Flu shot today.   Consider Shingrix at pharmacy or hopefully we have this in next year.   Please stop by lab before you go  [place] job with gradual weight loss

## 2017-04-30 NOTE — Progress Notes (Signed)
Phone: [phone]  Subjective:  Patient presents today for their annual physical. Chief complaint-noted.   See problem oriented charting- ROS- full  review of systems was completed and negative except for: difficulty losing weight. No chest pain or shortness of breath. No headache or blurry vision. Some lingering pain in left abdomen after shingles recently.    The following were reviewed and entered/updated in epic: Past Medical History:  Diagnosis Date  . Allergy   . Anxiety   . Depression   . Hyperlipidemia   . Hypertension   . Obese    Patient Active Problem List   Diagnosis Date Noted  . Migraine headache with aura 10/03/2014    Priority: Medium  . Essential hypertension 07/03/2008    Priority: Medium  . Hyperlipidemia 01/08/2007    Priority: Medium  . Depression 01/08/2007    Priority: Medium  . Hip bursitis 05/12/2016    Priority: Low  . 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 BENIGN POSITIONAL VERTIGO 05/22/2009    Priority: Low  . Obesity 01/24/2008    Priority: Low  . Allergic rhinitis 01/08/2007    Priority: Low   Past Surgical History:  Procedure Laterality Date  . ABDOMINAL HYSTERECTOMY    . carpal tunnel surgery bilaterally    . CHOLECYSTECTOMY    . left foot fascia release    . parotid gland surgery right side      Family History  Problem Relation Age of Onset  . Hypertension Mother   . Angina Mother        no stents  . Uterine cancer Mother   . Alzheimer's disease Mother        in facility since 2016  . Heart attack Father        mid 77s  . Glaucoma Father     Medications- reviewed and updated Current Outpatient Prescriptions  Medication Sig Dispense Refill  . aspirin 81 MG EC tablet Take 81 mg by mouth daily.      Marland Kitchen atenolol (TENORMIN) 50 MG tablet Take 1 tablet (50 mg total) by mouth daily. (Patient taking differently: Take 25 mg by mouth daily. ) 91 tablet 3  . calcium carbonate (OS-CAL) 600 MG TABS Take 600 mg by mouth 2  (two) times daily with a meal.      . citalopram (CELEXA) 10 MG tablet Take 1 tablet (10 mg total) by mouth daily. 91 tablet 3  . fish oil-omega-3 fatty acids 1000 MG capsule Take 1 g by mouth daily.      Marland Kitchen loratadine (CLARITIN) 10 MG tablet Take 10 mg by mouth daily.    . Multiple Vitamin (MULTIVITAMIN) tablet Take 1 tablet by mouth daily.      . simvastatin (ZOCOR) 20 MG tablet TAKE 1 TABLET (20 MG TOTAL) BY MOUTH AT BEDTIME. 91 tablet 3   Current Facility-Administered Medications  Medication Dose Route Frequency Provider Last Rate Last Dose  . methylPREDNISolone acetate (DEPO-MEDROL) injection 80 mg  80 mg Intramuscular Once Dorothyann Peng, NP        Allergies-reviewed and updated No Known Allergies  Social History   Social History  . Marital status: Married    Spouse name: Thomas Martinique  . Number of children: 3  . Years of education: 12+   Occupational History  . 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 Smoking status: Never Smoker  . Smokeless tobacco: Never Used  . Alcohol use No  . Drug use: No  .  Sexual activity: Not Asked   Other Topics Concern  . None   Social History Narrative   Lives with her husband. Their adult children live nearby- 3 children. 3 grandkids.       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 Oropharynx normal Neck: no thyromegaly CV: RRR no murmurs rubs or gallops Lungs: CTAB no crackles, wheeze, rhonchi Abdomen: soft/nontender/nondistended/normal bowel sounds. No rebound or guarding.  Ext: no edema Skin: warm, dry Neuro: grossly normal, moves all extremities,  PERRLA  Assessment/Plan:  61 y.o. female presenting for annual physical.  Health Maintenance counseling: 1. Anticipatory guidance: Patient counseled regarding regular dental exams -q6 months, eye exams - yearly due november, wearing seatbelts.  2. Risk factor reduction:  Advised patient of need for regular exercise and diet rich and fruits and vegetables to reduce risk of heart attack and stroke. Exercise- walking at least every other day on treadmill. Diet-has been on a weight loss program through [facility]- not the best fit for her. 2 salads a day, 2 eggs anyway she wants in the morning, water, 8 oz of 1% milk AM. Snacks- apple, orange, berries.  Wt Readings from Last 3 Encounters:  04/30/17 218 lb (98.9 kg)  03/10/17 219 lb (99.3 kg)  05/12/16 222 lb (100.7 kg)  3. Immunizations/screenings/ancillary studies- flu shot today. Discussed shingrix availability issues  Immunization History  Administered Date(s) Administered  . Influenza Split 04/15/2012  . Influenza Whole 08/16/2009  . Influenza,inj,Quad PF,6+ Mos 05/08/2014, 05/22/2015, 05/12/2016, 04/30/2017  . Pneumococcal Conjugate-13 05/22/2015  . Td 01/02/2004  . Tdap 05/05/2013   4. Cervical cancer screening- 03/26/15-  Normal on our records and gets yearly- will plan to get records next year. Sees Dr. Gertie Fey 5. Breast cancer screening-  breast exam with GYN and mammogram 05/06/16.  6. Colon cancer screening - 10/16/16 with 10 year follow up 7. Skin cancer screening- advised regular sunscreen use. Denies worrisome, changing, or new skin lesions.    Status of chronic or acute concerns   Some allergy symptoms- mild dry cough.   Essential hypertension S: controlled on atenolol 12.5 mg (only half tablet of 25mg ).  BP Readings from Last 3 Encounters:  04/30/17 122/78  03/10/17 110/74  03/08/17 136/79  A/P: We discussed blood pressure goal of <140/90. Continue current meds. Plans for 1 year visit- discussed home monitoring to make  sure pressure <140/90.   Hyperlipidemia S: mild poorly controlled on  Last check- hopeful weight loss hlelped. No myalgias. Also on krill oil.  Lab Results  Component Value Date   CHOL 156 04/16/2016   HDL 30 (A) 04/16/2016   LDLCALC 83 04/16/2016   LDLDIRECT 106.5 01/06/2007   TRIG 215 (A) 04/16/2016   CHOLHDL 6.3 CALC 01/20/2008   A/P: continue simvastatin 20mg . Lipids came back actually up some on LDL- will continue to push for weight loss. Could change to atorvastatin 20mg  if progress stalls Lab Results  Component Value Date   CHOL 180 04/30/2017   HDL 39.20 04/30/2017   LDLCALC 112 (H) 04/30/2017   LDLDIRECT  106.5 01/06/2007   TRIG 144.0 04/30/2017   CHOLHDL 5 04/30/2017     Depression S: on celexa 10mg . Controlling depression. PHQ9 of 0.  A/P:discussed trial off- but since she has been stable and on low dose- prefers to remain on medication and I agreed to continue  Migraine headache with aura S: migraines- uses excedrin migraine for most part. Back up vicodin previously ,now has 3 percocet left from shingles- 3 tablets left.  A/P: I would be willing to refill the vicodin- discussed due to STOP act would have to have her back in for this refill  Orders Placed This Encounter  Procedures  . Flu Vaccine QUAD 36+ mos IM  . CBC    [place]  . Comprehensive metabolic panel    [place]    Order Specific Question:   Has the patient fasted?    Answer:   No  . Lipid panel    [place]    Order Specific Question:   Has the patient fasted?    Answer:   No   Return precautions advised.  Garret Reddish, MD

## 2017-05-01 LAB — LIPID PANEL
CHOL/HDL RATIO: 5
Cholesterol: 180 mg/dL (ref 0–200)
HDL: 39.2 mg/dL (ref >39.00)
LDL CALC: 112 mg/dL — AB (ref 0–99)
NonHDL: 140.76
TRIGLYCERIDES: 144 mg/dL (ref 0.0–149.0)
VLDL: 28.8 mg/dL (ref 0.0–40.0)

## 2017-05-01 LAB — CBC
HCT: 43.1 % (ref 36.0–46.0)
Hemoglobin: 14.2 g/dL (ref 12.0–15.0)
MCHC: 32.9 g/dL (ref 30.0–36.0)
MCV: 94.8 fl (ref 78.0–100.0)
PLATELETS: 278 10*3/uL (ref 150.0–400.0)
RBC: 4.55 Mil/uL (ref 3.87–5.11)
RDW: 13.9 % (ref 11.5–15.5)
WBC: 9.3 10*3/uL (ref 4.0–10.5)

## 2017-05-01 LAB — COMPREHENSIVE METABOLIC PANEL
ALT: 14 U/L (ref 0–35)
AST: 16 U/L (ref 0–37)
Albumin: 4.4 g/dL (ref 3.5–5.2)
Alkaline Phosphatase: 59 U/L (ref 39–117)
BUN: 21 mg/dL (ref 6–23)
CALCIUM: 9.9 mg/dL (ref 8.4–10.5)
CHLORIDE: 104 meq/L (ref 96–112)
CO2: 32 meq/L (ref 19–32)
Creatinine, Ser: 0.61 mg/dL (ref 0.40–1.20)
GFR: 105.44 mL/min (ref >60.00)
Glucose, Bld: 81 mg/dL (ref 70–99)
Potassium: 4.1 mEq/L (ref 3.5–5.1)
Sodium: 141 mEq/L (ref 135–145)
Total Bilirubin: 0.7 mg/dL (ref 0.2–1.2)
Total Protein: 6.7 g/dL (ref 6.0–8.3)

## 2017-05-03 NOTE — Assessment & Plan Note (Signed)
S: migraines- uses excedrin migraine for most part. Back up vicodin previously ,now has 3 percocet left from shingles- 3 tablets left.  A/P: I would be willing to refill the vicodin- discussed due to STOP act would have to have her back in for this refill

## 2017-05-03 NOTE — Assessment & Plan Note (Signed)
S: mild poorly controlled on  Last check- hopeful weight loss hlelped. No myalgias. Also on krill oil.  Lab Results  Component Value Date   CHOL 156 04/16/2016   HDL 30 (A) 04/16/2016   LDLCALC 83 04/16/2016   LDLDIRECT 106.5 01/06/2007   TRIG 215 (A) 04/16/2016   CHOLHDL 6.3 CALC 01/20/2008   A/P: continue simvastatin 20mg . Lipids came back actually up some on LDL- will continue to push for weight loss. Could change to atorvastatin 20mg  if progress stalls Lab Results  Component Value Date   CHOL 180 04/30/2017   HDL 39.20 04/30/2017   LDLCALC 112 (H) 04/30/2017   LDLDIRECT 106.5 01/06/2007   TRIG 144.0 04/30/2017   CHOLHDL 5 04/30/2017

## 2017-05-03 NOTE — Assessment & Plan Note (Signed)
S: controlled on atenolol 12.5 mg (only half tablet of 25mg ).  BP Readings from Last 3 Encounters:  04/30/17 122/78  03/10/17 110/74  03/08/17 136/79  A/P: We discussed blood pressure goal of <140/90. Continue current meds. Plans for 1 year visit- discussed home monitoring to make sure pressure <140/90.

## 2017-05-03 NOTE — Assessment & Plan Note (Signed)
S: on celexa 10mg . Controlling depression. PHQ9 of 0.  A/P:discussed trial off- but since she has been stable and on low dose- prefers to remain on medication and I agreed to continue

## 2017-05-07 ENCOUNTER — Ambulatory Visit (INDEPENDENT_AMBULATORY_CARE_PROVIDER_SITE_OTHER): Payer: BLUE CROSS/BLUE SHIELD | Admitting: Family Medicine

## 2017-05-07 ENCOUNTER — Encounter: Payer: Self-pay | Admitting: Family Medicine

## 2017-05-07 VITALS — BP 132/82 | HR 68 | Temp 98.0°F | Ht 67.0 in | Wt 219.8 lb

## 2017-05-07 DIAGNOSIS — M79601 Pain in right arm: Secondary | ICD-10-CM

## 2017-05-07 DIAGNOSIS — M791 Myalgia, unspecified site: Secondary | ICD-10-CM

## 2017-05-07 MED ORDER — CYCLOBENZAPRINE HCL 5 MG PO TABS: 5.0000 mg | ORAL_TABLET | Freq: Every evening | ORAL | 0 refills | Status: DC | PRN

## 2017-05-07 NOTE — Progress Notes (Signed)
HPI:  Acute visit for arm pain: -Coin boxes falling and she caught it with her right arm 2 days ago -develop soreness in the muscles throughout the right arm over the next 24 hours -Heat helps the pain, ibuprofen has not helped much -No weakness, fevers, paresthesias, malaise  ROS: See pertinent positives and negatives per HPI.  Past Medical History:  Diagnosis Date  . Allergy   . Anxiety   . Depression   . Hyperlipidemia   . Hypertension   . Obese     Past Surgical History:  Procedure Laterality Date  . ABDOMINAL HYSTERECTOMY    . carpal tunnel surgery bilaterally    . CHOLECYSTECTOMY    . left foot fascia release    . parotid gland surgery right side      Family History  Problem Relation Age of Onset  . Hypertension Mother   . Angina Mother        no stents  . Uterine cancer Mother   . Alzheimer's disease Mother        in facility since 2016  . Heart attack Father        mid 20s  . Glaucoma Father     Social History   Social History  . Marital status: Married    Spouse name: Thomas Martinique  . Number of children: 3  . Years of education: 12+   Occupational History  . 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 Smoking status: Never Smoker  . Smokeless tobacco: Never Used  . Alcohol use No  . Drug use: No  . Sexual activity: Not Asked   Other Topics Concern  . None   Social History Narrative   Lives with her husband. Their adult children live nearby- 3 children. 3 grandkids.       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  aspirin 81 MG EC tablet, Take 81 mg by mouth daily.  , Disp: , Rfl:  .  atenolol (TENORMIN) 50 MG tablet, Take 1 tablet (50 mg total) by mouth daily. (Patient taking differently: Take 25 mg by mouth daily. ), Disp: 91 tablet, Rfl: 3 .   calcium carbonate (OS-CAL) 600 MG TABS, Take 600 mg by mouth 2 (two) times daily with a meal.  , Disp: , Rfl:  .  citalopram (CELEXA) 10 MG tablet, Take 1 tablet (10 mg total) by mouth daily., Disp: 91 tablet, Rfl: 3 .  fish oil-omega-3 fatty acids 1000 MG capsule, Take 1 g by mouth daily.  , Disp: , Rfl:  .  loratadine (CLARITIN) 10 MG tablet, Take 10 mg by mouth daily., Disp: , Rfl:  .  Multiple Vitamin (MULTIVITAMIN) tablet, Take 1 tablet by mouth daily.  , Disp: , Rfl:  .  simvastatin (ZOCOR) 20 MG tablet, TAKE 1 TABLET (20 MG TOTAL) BY MOUTH AT BEDTIME., Disp: 91 tablet, Rfl: 3 .  cyclobenzaprine (FLEXERIL) 5 MG tablet, Take 1 tablet (5 mg total) by mouth at bedtime as needed for muscle spasms., Disp: 20 tablet, Rfl: 0  Current Facility-Administered Medications:  .  methylPREDNISolone acetate (DEPO-MEDROL) injection 80 mg, 80 mg, Intramuscular, Once, Nafziger, Tommi Rumps, NP  EXAM:  Vitals:   05/07/17 1428  BP: 132/82  Pulse: 68  Temp: 98 F (36.7 C)  Body mass index is 34.43 kg/m.  GENERAL: vitals reviewed and listed above, alert, oriented, appears well hydrated and in no acute distress  HEENT: atraumatic, conjunttiva clear, no obvious abnormalities on inspection of external nose and ears  NECK: no obvious masses on inspection  MS: moves all extremities without noticeable abnormality, Normal inspection of both upper extremities, shoulders and neck. She has normal range of motion throughout the head and neck/shoulders and upper extremity, normal sensitivity to light touch/strength/DTRs in the upper extremities bilaterally, she has tenderness to palpation diffusely in different muscle groups in the upper arm and forearm, neurovascularly intact distally  PSYCH: pleasant and cooperative, no obvious depression or anxiety  ASSESSMENT AND PLAN:  Discussed the following assessment and plan:  Pain of right upper extremity  Muscle soreness  -we discussed possible serious and likely  etiologies, workup and treatment, treatment risks and return precautions - likely muscle soreness/strain -after this discussion, Paiton opted for Heat, conservative treatments for pain, muscle relaxer at night -follow up advised in 2-3 weeks -of course, we advised Alvin  to return or notify a doctor immediately if symptoms worsen or persist or new concerns arise.   Patient Instructions  BEFORE YOU LEAVE: -follow up: 2-3 weeks  Heat (rice sock is good) 15 minutes 1-2 times daily as needed for pain.  Topical menthol (tiger balm) as needed for pain.  Aleve per instructions as needed for pain - do not take more then is advised on the bottle.  Muscle relaxer at night if needed.  Do normal gentle activities and the exercises provided 4-5 days per week.  I hope you are feeling better soon! Follow up sooner if worsening, new concerns or you are not improving with treatment.      Colin Benton R., DO

## 2017-05-07 NOTE — Patient Instructions (Addendum)
BEFORE YOU LEAVE: -follow up: 2-3 weeks  Heat (rice sock is good) 15 minutes 1-2 times daily as needed for pain.  Topical menthol (tiger balm) as needed for pain.  Aleve per instructions as needed for pain - do not take more then is advised on the bottle.  Muscle relaxer at night if needed.  Do normal gentle activities and the exercises provided 4-5 days per week.  I hope you are feeling better soon! Follow up sooner if worsening, new concerns or you are not improving with treatment.

## 2017-05-08 ENCOUNTER — Ambulatory Visit: Payer: Self-pay | Admitting: Family Medicine

## 2017-06-05 ENCOUNTER — Other Ambulatory Visit: Payer: Self-pay | Admitting: Family Medicine

## 2017-07-07 DIAGNOSIS — R635 Abnormal weight gain: Secondary | ICD-10-CM | POA: Diagnosis not present

## 2017-07-07 DIAGNOSIS — J209 Acute bronchitis, unspecified: Secondary | ICD-10-CM | POA: Diagnosis not present

## 2017-07-07 DIAGNOSIS — R509 Fever, unspecified: Secondary | ICD-10-CM | POA: Diagnosis not present

## 2017-07-09 ENCOUNTER — Other Ambulatory Visit: Payer: Self-pay | Admitting: Family Medicine

## 2017-07-09 DIAGNOSIS — F3342 Major depressive disorder, recurrent, in full remission: Secondary | ICD-10-CM

## 2017-07-16 DIAGNOSIS — J069 Acute upper respiratory infection, unspecified: Secondary | ICD-10-CM | POA: Diagnosis not present

## 2017-09-02 DIAGNOSIS — Z6834 Body mass index (BMI) 34.0-34.9, adult: Secondary | ICD-10-CM | POA: Diagnosis not present

## 2017-09-02 DIAGNOSIS — Z01419 Encounter for gynecological examination (general) (routine) without abnormal findings: Secondary | ICD-10-CM | POA: Diagnosis not present

## 2017-09-02 DIAGNOSIS — Z1231 Encounter for screening mammogram for malignant neoplasm of breast: Secondary | ICD-10-CM | POA: Diagnosis not present

## 2017-09-02 LAB — HM PAP SMEAR: HM Pap smear: NEGATIVE

## 2017-09-04 LAB — HM MAMMOGRAPHY

## 2017-09-24 ENCOUNTER — Encounter: Payer: Self-pay | Admitting: Family Medicine

## 2017-09-24 ENCOUNTER — Ambulatory Visit: Payer: BLUE CROSS/BLUE SHIELD | Admitting: Family Medicine

## 2017-09-24 VITALS — BP 130/80 | HR 81 | Temp 98.1°F | Ht 67.0 in | Wt 227.6 lb

## 2017-09-24 DIAGNOSIS — R509 Fever, unspecified: Secondary | ICD-10-CM | POA: Diagnosis not present

## 2017-09-24 DIAGNOSIS — R059 Cough, unspecified: Secondary | ICD-10-CM

## 2017-09-24 DIAGNOSIS — R05 Cough: Secondary | ICD-10-CM | POA: Diagnosis not present

## 2017-09-24 DIAGNOSIS — J40 Bronchitis, not specified as acute or chronic: Secondary | ICD-10-CM | POA: Diagnosis not present

## 2017-09-24 LAB — POCT INFLUENZA A/B
INFLUENZA B, POC: NEGATIVE
Influenza A, POC: NEGATIVE

## 2017-09-24 MED ORDER — METHYLPREDNISOLONE ACETATE 80 MG/ML IJ SUSP
80.0000 mg | Freq: Once | INTRAMUSCULAR | Status: AC
  Administered 2017-09-24: 80 mg via INTRAMUSCULAR

## 2017-09-24 MED ORDER — GUAIFENESIN-CODEINE 100-10 MG/5ML PO SOLN: 5.0000 mL | Freq: Three times a day (TID) | ORAL | 0 refills | Status: DC | PRN

## 2017-09-24 MED ORDER — AZITHROMYCIN 250 MG PO TABS: ORAL_TABLET | ORAL | 0 refills | Status: DC

## 2017-09-24 NOTE — Addendum Note (Signed)
Addended by: Elmer Bales on: 09/24/2017 12:19 PM   Modules accepted: Orders

## 2017-09-24 NOTE — Patient Instructions (Signed)
Start the zpack.   Please stay well hydrated.  You can take tylenol and/or motrin as needed for low grade fever and pain.  Please let me know if your symptoms worsen or fail to improve.  Take care, Dr Jerline Pain

## 2017-09-24 NOTE — Progress Notes (Signed)
   Subjective:  Rachel Gates is a 62 y.o. female who presents today for same-day appointment with a chief complaint of cough.   HPI:  Cough, Acute Issue Started 5 days ago. Worsened over that time. Associated with facial pain, nasal congestion, fever, and myalgias.  Symptoms have significantly worsened over the last 24 hours.  She has subjective fevers yesterday.  Also started some wheezing.  She is tried over-the-counter Sudafed and Delsym which have not helped with her symptoms.  No obvious sick contacts.  No other obvious alleviating or aggravating factors.  ROS: Per HPI  PMH: She reports that  has never smoked. she has never used smokeless tobacco. She reports that she does not drink alcohol or use drugs.  Objective:  Physical Exam: BP 130/80 (BP Location: Left Arm, Patient Position: Sitting, Cuff Size: Normal)   Pulse 81   Temp 98.1 F (36.7 C) (Oral)   Ht 5\' 7"  (1.702 m)   Wt 227 lb 9.6 oz (103.2 kg)   SpO2 95%   BMI 35.65 kg/m   Gen: NAD, resting comfortably CV: RRR with no murmurs appreciated Pulm: NWOB, and expiratory wheeze with very faint crackles noted at right lower lung base.   Results for orders placed or performed in visit on 09/24/17 (from the past 24 hour(s))  POCT Influenza A/B     Status: None   Collection Time: 09/24/17 11:37 AM  Result Value Ref Range   Influenza A, POC Negative Negative   Influenza B, POC Negative Negative   Assessment/Plan:  Cough, Fever, Bronchitis Given the patient has had a re-worsening of symptoms as well as her objective lung findings, we will treat her for bronchitis.  Start Z-Pak.  We will give 80 mg IM Depo-Medrol.  Encouraged good oral hydration.  Recommended Tylenol and/or Motrin as needed for low-grade fever and pain.  Strict return precautions reviewed.  Follow-up as needed.  Algis Greenhouse. Jerline Pain, MD 09/24/2017 11:55 AM

## 2017-09-25 ENCOUNTER — Telehealth: Payer: Self-pay | Admitting: Family Medicine

## 2017-09-25 NOTE — Telephone Encounter (Signed)
Note has been written and signed.  Placed at front for pick up.    Ellie, will you please call the patient to let her know it is ready?  Thank you so much!

## 2017-09-25 NOTE — Telephone Encounter (Signed)
Please advise.   Copied from [place] [phone]. Topic: Quick Communication - See Telephone Encounter >> Sep 25, 2017  9:35 AM Antonieta Iba C wrote: CRM for notification. See Telephone encounter for: pt was seen by Dr. Jerline Pain. Pt would like to have a return to work note to return Monday. Please call when ready for pick up    09/25/17.

## 2017-09-25 NOTE — Telephone Encounter (Signed)
LVM for pt stating it's ready for pick up.

## 2017-12-26 DIAGNOSIS — M7071 Other bursitis of hip, right hip: Secondary | ICD-10-CM | POA: Diagnosis not present

## 2017-12-26 DIAGNOSIS — E668 Other obesity: Secondary | ICD-10-CM | POA: Diagnosis not present

## 2017-12-26 DIAGNOSIS — R635 Abnormal weight gain: Secondary | ICD-10-CM | POA: Diagnosis not present

## 2018-01-23 DIAGNOSIS — R635 Abnormal weight gain: Secondary | ICD-10-CM | POA: Diagnosis not present

## 2018-01-23 DIAGNOSIS — R0602 Shortness of breath: Secondary | ICD-10-CM | POA: Diagnosis not present

## 2018-02-20 DIAGNOSIS — R635 Abnormal weight gain: Secondary | ICD-10-CM | POA: Diagnosis not present

## 2018-02-26 ENCOUNTER — Other Ambulatory Visit: Payer: Self-pay | Admitting: Family Medicine

## 2018-02-26 DIAGNOSIS — R0602 Shortness of breath: Secondary | ICD-10-CM | POA: Diagnosis not present

## 2018-03-21 DIAGNOSIS — R635 Abnormal weight gain: Secondary | ICD-10-CM | POA: Diagnosis not present

## 2018-03-21 DIAGNOSIS — R0602 Shortness of breath: Secondary | ICD-10-CM | POA: Diagnosis not present

## 2018-04-05 ENCOUNTER — Other Ambulatory Visit: Payer: Self-pay | Admitting: Family Medicine

## 2018-04-05 DIAGNOSIS — F3342 Major depressive disorder, recurrent, in full remission: Secondary | ICD-10-CM

## 2018-04-30 ENCOUNTER — Encounter: Payer: Self-pay | Admitting: Family Medicine

## 2018-04-30 ENCOUNTER — Ambulatory Visit: Payer: BLUE CROSS/BLUE SHIELD | Admitting: Family Medicine

## 2018-04-30 VITALS — BP 118/86 | HR 58 | Temp 97.7°F | Ht 67.0 in | Wt 226.8 lb

## 2018-04-30 DIAGNOSIS — G43119 Migraine with aura, intractable, without status migrainosus: Secondary | ICD-10-CM

## 2018-04-30 MED ORDER — PROMETHAZINE HCL 12.5 MG PO TABS: 12.5000 mg | ORAL_TABLET | Freq: Three times a day (TID) | ORAL | 0 refills | Status: DC | PRN

## 2018-04-30 MED ORDER — KETOROLAC TROMETHAMINE 60 MG/2ML IM SOLN
60.0000 mg | Freq: Once | INTRAMUSCULAR | Status: AC
  Administered 2018-04-30: 60 mg via INTRAMUSCULAR

## 2018-04-30 NOTE — Progress Notes (Signed)
Patient: Rachel Martinique MRN: [id number] DOB: [birth year] PCP: Marin Olp, MD     Subjective:  Chief Complaint  Patient presents with  . Migraine    HPI: The patient is a 62 y.o. female who presents today for migraine x 2 days. She has a history of migraines with aura. She normally can take excedrin migraine and drink some caffiene nad then lay down and they go away. She got an aura when getting her nails painted on Wednesday and then got the migraine. Went home and took the excedrin migraine and laid down. When she woke up it was a little better, but by the end of the day it was worse and has not gone away. She took a Norco last night and it did ease it, but it came back full force. Headache located on bilateral frontal lobes, top of head and sometimes down down her head/neck. She rarely gets migraines now after menopause. Her last migraine was a few months ago and went away quickly. Pain rated as an 8/10. She has photophobia and some nausea. Not the worse headache of her life and not a thunderclap headache. No vision changes, just the photophobia. Also has phonophobia. She doesn't have much of an appetite due to the nausea.   Review of Systems  Constitutional: Positive for fatigue.  Respiratory: Negative for shortness of breath.   Cardiovascular: Negative for chest pain.  Gastrointestinal: Positive for nausea. Negative for abdominal pain and vomiting.  Musculoskeletal: Positive for neck pain. Negative for back pain.  Neurological: Positive for light-headedness and headaches.  Psychiatric/Behavioral: Positive for sleep disturbance.    Allergies Patient is allergic to prednisone.  Past Medical History Patient  has a past medical history of Allergy, Anxiety, Depression, Hyperlipidemia, Hypertension, and Obese.  Surgical History Patient  has a past surgical history that includes Abdominal hysterectomy; Cholecystectomy; parotid gland surgery right side; carpal tunnel surgery  bilaterally; and left foot fascia release.  Family History Pateint's family history includes Alzheimer's disease in her mother; Angina in her mother; Glaucoma in her father; Heart attack in her father; Hypertension in her mother; Uterine cancer in her mother.  Social History Patient  reports that she has never smoked. She has never used smokeless tobacco. She reports that she does not drink alcohol or use drugs.    Objective: Vitals:   04/30/18 1125  BP: 118/86  Pulse: (!) 58  Temp: 97.7 F (36.5 C)  TempSrc: Oral  SpO2: 95%  Weight: 226 lb 12.8 oz (102.9 kg)  Height: 5\' 7"  (1.702 m)    Body mass index is 35.52 kg/m.  Physical Exam  Constitutional: She is oriented to person, place, and time. She appears well-developed and well-nourished.  Neck: Normal range of motion. Neck supple.  Cardiovascular: Normal rate, regular rhythm and normal heart sounds.  Pulmonary/Chest: Effort normal and breath sounds normal.  Abdominal: Soft. Bowel sounds are normal.  Neurological: She is alert and oriented to person, place, and time. No cranial nerve deficit or sensory deficit. She exhibits normal muscle tone. Coordination normal.  Vitals reviewed.      Assessment/plan: 1. Intractable migraine with aura without status migrainosus toradol shot today and phenergan 12.5mg  prn for nausea. She will go home and rest. drowsy precautions given with phenergan. Low dose due to age and she has had before and tolerated fine.  Precautions given for worse h/a of life, waking her up at night or focal deficits to go to ER. Let us know if not getting better or  if continues to have migraines on a more frequent basis.  - ketorolac (TORADOL) injection 60 mg    Return if symptoms worsen or fail to improve.    Orma Flaming, MD [place]   04/30/2018

## 2018-05-03 ENCOUNTER — Ambulatory Visit: Payer: Self-pay | Admitting: *Deleted

## 2018-05-03 ENCOUNTER — Emergency Department (HOSPITAL_COMMUNITY)
Admission: EM | Admit: 2018-05-03 | Discharge: 2018-05-03 | Disposition: A | Discharge status: Home / Self Care | Payer: BLUE CROSS/BLUE SHIELD | Attending: Emergency Medicine | Admitting: Emergency Medicine

## 2018-05-03 ENCOUNTER — Other Ambulatory Visit: Payer: Self-pay

## 2018-05-03 ENCOUNTER — Encounter (HOSPITAL_COMMUNITY): Payer: Self-pay | Admitting: *Deleted

## 2018-05-03 DIAGNOSIS — I10 Essential (primary) hypertension: Secondary | ICD-10-CM | POA: Diagnosis not present

## 2018-05-03 DIAGNOSIS — R197 Diarrhea, unspecified: Secondary | ICD-10-CM | POA: Insufficient documentation

## 2018-05-03 DIAGNOSIS — R51 Headache: Secondary | ICD-10-CM | POA: Insufficient documentation

## 2018-05-03 DIAGNOSIS — Z7982 Long term (current) use of aspirin: Secondary | ICD-10-CM | POA: Insufficient documentation

## 2018-05-03 DIAGNOSIS — Z79899 Other long term (current) drug therapy: Secondary | ICD-10-CM | POA: Diagnosis not present

## 2018-05-03 DIAGNOSIS — R55 Syncope and collapse: Secondary | ICD-10-CM | POA: Insufficient documentation

## 2018-05-03 LAB — URINALYSIS, ROUTINE W REFLEX MICROSCOPIC
Bilirubin Urine: NEGATIVE
GLUCOSE, UA: NEGATIVE mg/dL
HGB URINE DIPSTICK: NEGATIVE
Ketones, ur: NEGATIVE mg/dL
Leukocytes, UA: NEGATIVE
Nitrite: NEGATIVE
PROTEIN: NEGATIVE mg/dL
Specific Gravity, Urine: 1.024 (ref 1.005–1.030)
pH: 5 (ref 5.0–8.0)

## 2018-05-03 LAB — BASIC METABOLIC PANEL
Anion gap: 11 (ref 5–15)
BUN: 20 mg/dL (ref 8–23)
CALCIUM: 9.5 mg/dL (ref 8.9–10.3)
CO2: 26 mmol/L (ref 22–32)
Chloride: 103 mmol/L (ref 98–111)
Creatinine, Ser: 0.59 mg/dL (ref 0.44–1.00)
GFR calc Af Amer: >60 mL/min (ref >60)
GLUCOSE: 102 mg/dL — AB (ref 70–99)
Potassium: 3.9 mmol/L (ref 3.5–5.1)
Sodium: 140 mmol/L (ref 135–145)

## 2018-05-03 LAB — CBC WITH DIFFERENTIAL/PLATELET
Abs Immature Granulocytes: 0.1 10*3/uL (ref 0.0–0.1)
BASOS ABS: 0.1 10*3/uL (ref 0.0–0.1)
Basophils Relative: 1 %
EOS PCT: 2 %
Eosinophils Absolute: 0.3 10*3/uL (ref 0.0–0.7)
HEMATOCRIT: 44.1 % (ref 36.0–46.0)
HEMOGLOBIN: 14.4 g/dL (ref 12.0–15.0)
Immature Granulocytes: 0 %
LYMPHS PCT: 28 %
Lymphs Abs: 3.2 10*3/uL (ref 0.7–4.0)
MCH: 31.8 pg (ref 26.0–34.0)
MCHC: 32.7 g/dL (ref 30.0–36.0)
MCV: 97.4 fL (ref 78.0–100.0)
MONO ABS: 1 10*3/uL (ref 0.1–1.0)
MONOS PCT: 9 %
Neutro Abs: 6.8 10*3/uL (ref 1.7–7.7)
Neutrophils Relative %: 60 %
Platelets: 285 10*3/uL (ref 150–400)
RBC: 4.53 MIL/uL (ref 3.87–5.11)
RDW: 12.8 % (ref 11.5–15.5)
WBC: 11.3 10*3/uL — ABNORMAL HIGH (ref 4.0–10.5)

## 2018-05-03 LAB — TROPONIN I

## 2018-05-03 MED ORDER — ACETAMINOPHEN 500 MG PO TABS
1000.0000 mg | ORAL_TABLET | Freq: Once | ORAL | Status: AC
  Administered 2018-05-03: 1000 mg via ORAL
  Filled 2018-05-03: qty 2

## 2018-05-03 MED ORDER — LACTATED RINGERS IV BOLUS
1000.0000 mL | Freq: Once | INTRAVENOUS | Status: AC
  Administered 2018-05-03: 1000 mL via INTRAVENOUS

## 2018-05-03 NOTE — Telephone Encounter (Signed)
Given syncopal episode- I think she needs to be seen today- [place] or emergency room. I wish I had more availability to see her today but already overbooked.

## 2018-05-03 NOTE — Telephone Encounter (Signed)
Called and spoke to patient and advised her to go to the Emergency Room. Patient stated understanding.

## 2018-05-03 NOTE — ED Triage Notes (Signed)
Pt in c/o syncopal episode today while at work, pt states she stood up and felt dizzy and was able to stop herself from falling, reports last night developed diarrhea after dinner and is unsure if she is dehydrated, has not eaten much today

## 2018-05-03 NOTE — ED Provider Notes (Signed)
MSE was initiated and I personally evaluated the patient and placed orders (if any) at  3:17 PM on May 03, 2018.  The patient appears stable so that the remainder of the MSE may be completed by another provider.  Patient placed in Quick Look pathway, seen and evaluated   Chief Complaint: syncope  HPI:   62yo F with a pmh of HTN presents to ED for syncopal episode that occurred PTA at work. She had a migraine Thursday night, some improvement with Vicodin. HA returned Friday, PCP gave Toradol shot. Saturday thought she ate some "bad fish" so had diarrhea x several episodes. Improvement with Imodium. Did not eat much breakfast this morning because "busy at work, so I don't have time to go to the bathroom." Reports having migraine prior to syncopal episode. She felt herself getting lightheaded, then laid on the floor, passed out for about 2-3 min? Woke up when manager called her name. Denies blurry vision, neck pain, numbness, chest pain, history of PE, recent immobilization, history of MI.  ROS: syncope (one)  Physical Exam:   Gen: No distress  Neuro: Awake and Alert  Skin: Warm    Focused Exam: PERRL. No facial asymmetry noted. Strength 5/5 in BUE. RRR. Lungs CTAB.   Initiation of care has begun. The patient has been counseled on the process, plan, and necessity for staying for the completion/evaluation, and the remainder of the medical screening examination    Delia Heady, PA-C 05/03/18 1522    Dorie Rank, MD 05/03/18 [phone]

## 2018-05-03 NOTE — ED Notes (Signed)
Pt. Did not have any further questions with discharge paperwork.

## 2018-05-03 NOTE — Telephone Encounter (Signed)
See note

## 2018-05-03 NOTE — ED Provider Notes (Signed)
[facility] EMERGENCY DEPARTMENT Provider Note   CSN: [id number] Arrival date & time: 05/03/18  1448     History   Chief Complaint Chief Complaint  Patient presents with  . Loss of Consciousness    HPI Rachel Gates is a 62 y.o. female.  HPI 62 year old female PMH significant for migraines, high cholesterol who presents status post syncopal event.  Patient states that she was at work when she began to feel lightheaded without nausea vomiting, diaphoresis, chest pain, or tunnel vision.  Patient sat down which when she states she passed out.  Patient was down for roughly 1 minute until she was woken by her boss talking to her.  Bystanders deny abnormal movements.  Patient was back to baseline mentation in less than 2 minutes.  Patient states this is happened once before status post gallstones.  Patient denies changes in vision, focal weakness numbness or tingling.  Patient denies injury during syncopal episode due to herself to seated position and putting her on table.  Patient states that the night before she had watery diarrhea, nonbloody multiple times status post eating what she thought was "bad salmon."  Patient has been afebrile, no recent antibiotic use.  Patient endorses recent migraines and a history of migraines.  Patient currently endorses mild to moderate headache, bilateral squeezing, no changes in vision, gradual in onset.  Past Medical History:  Diagnosis Date  . Allergy   . Anxiety   . Depression   . Hyperlipidemia   . Hypertension   . Obese     Patient Active Problem List   Diagnosis Date Noted  . Hip bursitis 05/12/2016  . Migraine headache with aura 10/03/2014  . 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 BENIGN POSITIONAL VERTIGO 05/22/2009  . Essential hypertension 07/03/2008  . Obesity 01/24/2008  . Hyperlipidemia 01/08/2007  . Depression 01/08/2007  . Allergic rhinitis 01/08/2007    Past Surgical History:  Procedure Laterality Date  . ABDOMINAL  HYSTERECTOMY    . carpal tunnel surgery bilaterally    . CHOLECYSTECTOMY    . left foot fascia release    . parotid gland surgery right side       OB History   None      Home Medications    Prior to Admission medications   Medication Sig Start Date End Date Taking? Authorizing Provider  aspirin 81 MG EC tablet Take 81 mg by mouth daily.      [provider]  aspirin-acetaminophen-caffeine (EXCEDRIN MIGRAINE) [phone] MG tablet Take by mouth every 6 (six) hours as needed for headache.    [provider]  atenolol (TENORMIN) 50 MG tablet Take 1 tablet (50 mg total) by mouth daily. Patient taking differently: Take 25 mg by mouth daily.  05/12/16   Marin Olp, MD  calcium carbonate (OS-CAL) 600 MG TABS Take 600 mg by mouth 2 (two) times daily with a meal.      [provider]  citalopram (CELEXA) 10 MG tablet TAKE ONE TABLET BY MOUTH DAILY 04/06/18   Marin Olp, MD  fish oil-omega-3 fatty acids 1000 MG capsule Take 1 g by mouth daily.      [provider]  HYDROcodone-acetaminophen (NORCO/VICODIN) 5-325 MG tablet Take 1 tablet by mouth every 6 (six) hours as needed for moderate pain.    [provider]  loratadine (CLARITIN) 10 MG tablet Take 10 mg by mouth daily.    [provider]  Multiple Vitamin (MULTIVITAMIN) tablet Take 1 tablet by  mouth daily.      [provider]  promethazine (PHENERGAN) 12.5 MG tablet Take 1 tablet (12.5 mg total) by mouth every 8 (eight) hours as needed for nausea or vomiting. 04/30/18   Orma Flaming, MD  simvastatin (ZOCOR) 20 MG tablet TAKE ONE TABLET BY MOUTH EVERY NIGHT AT BEDTIME 02/26/18   Marin Olp, MD    Family History Family History  Problem Relation Age of Onset  . Hypertension Mother   . Angina Mother        no stents  . Uterine cancer Mother   . Alzheimer's disease Mother        in facility since 2016  . Heart attack Father        mid 31s  . Glaucoma  Father     Social History Social History   Tobacco Use  . Smoking status: Never Smoker  . Smokeless tobacco: Never Used  Substance Use Topics  . Alcohol use: No  . Drug use: No     Allergies   Prednisone   Review of Systems Review of Systems  Constitutional: Negative for chills and fever.  HENT: Negative for ear pain and sore throat.   Eyes: Negative for pain and visual disturbance.  Respiratory: Negative for cough and shortness of breath.   Cardiovascular: Negative for chest pain and palpitations.  Gastrointestinal: Positive for diarrhea. Negative for abdominal pain and vomiting.  Genitourinary: Negative for dysuria and hematuria.  Musculoskeletal: Negative for arthralgias and back pain.  Skin: Negative for color change and rash.  Neurological: Positive for syncope, light-headedness and headaches. Negative for seizures.  All other systems reviewed and are negative.    Physical Exam Updated Vital Signs BP (!) 148/83 (BP Location: Right Arm)   Pulse 76   Temp 99.3 F (37.4 C) (Oral)   Resp 18   LMP  (LMP Unknown)   SpO2 98%   Physical Exam  Constitutional: She appears well-developed and well-nourished. No distress.  HENT:  Head: Normocephalic and atraumatic.  Eyes: Conjunctivae are normal.  Neck: Neck supple.  Cardiovascular: Normal rate and regular rhythm.  No murmur heard. Pulmonary/Chest: Effort normal and breath sounds normal. No respiratory distress.  Abdominal: Soft. There is no tenderness.  Musculoskeletal: She exhibits no edema.  Neurological: She is alert.  PERRLA, GCS 15, CN II through XII intact, 5/5 strength in bilateral upper and lower extremities, normal sensation, normal gait on ambulation, negative Romberg, normal finger-to-nose bilaterally.  Skin: Skin is warm and dry.  Psychiatric: She has a normal mood and affect.  Nursing note and vitals reviewed.    ED Treatments / Results  Labs (all labs ordered are listed, but only abnormal  results are displayed) Labs Reviewed  BASIC METABOLIC PANEL - Abnormal; Notable for the following components:      Result Value   Glucose, Bld 102 (*)    All other components within normal limits  CBC WITH DIFFERENTIAL/PLATELET - Abnormal; Notable for the following components:   WBC 11.3 (*)    All other components within normal limits  URINALYSIS, ROUTINE W REFLEX MICROSCOPIC  TROPONIN I    EKG EKG Interpretation  Date/Time:  Monday May 03 2018 15:07:12 EDT Ventricular Rate:  72 PR Interval:  148 QRS Duration: 82 QT Interval:  382 QTC Calculation: 418 R Axis:   48 Text Interpretation:  Normal sinus rhythm Anterior infarct , age undetermined Abnormal ECG Since last tracing rate faster Confirmed by Dorie Rank [phone]) on 05/03/2018 5:36:28 PM  Radiology No results found.  Procedures Procedures (including critical care time)  Medications Ordered in ED Medications  acetaminophen (TYLENOL) tablet 1,000 mg (1,000 mg Oral Given 05/03/18 1916)  lactated ringers bolus 1,000 mL (1,000 mLs Intravenous New Bag/Given 05/03/18 1916)     Initial Impression / Assessment and Plan / ED Course  I have reviewed the triage vital signs and the nursing notes.  Pertinent labs & imaging results that were available during my care of the patient were reviewed by me and considered in my medical decision making (see chart for details).     62 year old female PMH significant for migraines, high cholesterol who presents status post syncopal event.  History as above.  DDX includes stroke first cardia genic syncope versus vasovagal syncope.  History not concerning for cardiogenic focus.  Patient with normal EKG and negative troponin.  Neurological exam negative.  Doubt neurological cause.  History not consistent with TIA.  Labs performed and within normal limits.  She is given 1 L LR.  Patient currently denies symptoms.  Patient okay to discharge with follow-up with PCP for vasovagal syncope  in setting of diarrheal illness.      Final Clinical Impressions(s) / ED Diagnoses   Final diagnoses:  Vasovagal syncope    ED Discharge Orders    None       Keenan Bachelor, MD 05/03/18 2033    Dorie Rank, MD 05/03/18 [phone]

## 2018-05-03 NOTE — Telephone Encounter (Signed)
Pt reports episode of dizziness this AM (45 minutes ago) which resulted in "Passing out, had to lie down on floor, everything went white."  Brief episode, pt at work. Presently with "Mild" dizziness. States just rested all weekend, "Didn't do much and now standing all morning at work."  Pt seen at practice Friday for migraine; states during episode this am headache returned , 8/10. Also reports "Ate salmon last night and both my husband and I developed diarrhea." States 4-5 episodes last night, none this am. States has been drinking fluids this am. No nausea, vomiting; denies any visual changes. Has not taken anything for the headache.  Husband is driving pt home from work. NT called office, spoke with Lea who will consult with Dr. Yong Channel re: disposition. Pt informed she will hear back from office. Care advise given, instructed to go to ED if headache, dizziness worsens. Answer Assessment - Initial Assessment Questions 1. DESCRIPTION: "Describe your dizziness."     Lightheaded 2. LIGHTHEADED: "Do you feel lightheaded?" (e.g., somewhat faint, woozy, weak upon standing)     Mild now 3. VERTIGO: "Do you feel like either you or the room is spinning or tilting?" (i.e. vertigo)     no 4. SEVERITY: "How bad is it?"  "Do you feel like you are going to faint?" "Can you stand and walk?"   - MILD - walking normally   - MODERATE - interferes with normal activities (e.g., work, school)    - SEVERE - unable to stand, requires support to walk, feels like passing out now.      "Passed out" 5. ONSET:  "When did the dizziness begin?"     45 minutes ago 6. AGGRAVATING FACTORS: "Does anything make it worse?" (e.g., standing, change in head position)     no 7. HEART RATE: "Can you tell me your heart rate?" "How many beats in 15 seconds?"  (Note: not all patients can do this)       no 8. CAUSE: "What do you think is causing the dizziness?"     Unsure 9. RECURRENT SYMPTOM: "Have you had dizziness before?" If so, ask:  "When was the last time?" "What happened that time?"     No 10. OTHER SYMPTOMS: "Do you have any other symptoms?" (e.g., fever, chest pain, vomiting, diarrhea, bleeding)       Diarrhea, 4-5 times last night, took imodium. No BM this am, drinking water now. Headache, 8/10.  Protocols used: DIZZINESS [facility]

## 2018-05-06 ENCOUNTER — Encounter: Payer: Self-pay | Admitting: Family Medicine

## 2018-05-06 ENCOUNTER — Ambulatory Visit (INDEPENDENT_AMBULATORY_CARE_PROVIDER_SITE_OTHER): Payer: BLUE CROSS/BLUE SHIELD | Admitting: Family Medicine

## 2018-05-06 VITALS — BP 132/80 | HR 71 | Temp 98.0°F | Ht 67.0 in | Wt 230.6 lb

## 2018-05-06 DIAGNOSIS — Z90710 Acquired absence of both cervix and uterus: Secondary | ICD-10-CM | POA: Diagnosis not present

## 2018-05-06 DIAGNOSIS — G43109 Migraine with aura, not intractable, without status migrainosus: Secondary | ICD-10-CM

## 2018-05-06 DIAGNOSIS — E785 Hyperlipidemia, unspecified: Secondary | ICD-10-CM

## 2018-05-06 DIAGNOSIS — F3342 Major depressive disorder, recurrent, in full remission: Secondary | ICD-10-CM

## 2018-05-06 DIAGNOSIS — I10 Essential (primary) hypertension: Secondary | ICD-10-CM

## 2018-05-06 DIAGNOSIS — Z23 Encounter for immunization: Secondary | ICD-10-CM

## 2018-05-06 MED ORDER — HYDROCODONE-ACETAMINOPHEN 5-325 MG PO TABS: 1.0000 | ORAL_TABLET | Freq: Four times a day (QID) | ORAL | 0 refills | Status: DC | PRN

## 2018-05-06 MED ORDER — ATENOLOL 25 MG PO TABS: 25.0000 mg | ORAL_TABLET | Freq: Every day | ORAL | 3 refills | Status: DC

## 2018-05-06 MED ORDER — HYDROCHLOROTHIAZIDE 25 MG PO TABS: 25.0000 mg | ORAL_TABLET | Freq: Every day | ORAL | 1 refills | Status: DC | PRN

## 2018-05-06 NOTE — Assessment & Plan Note (Addendum)
S: migraines- uses excedrin migraine for most part. Back up vicodin.  She did require Toradol injection in office 6 days ago.  She was given Phenergan to have on hand for nausea  3 days later she had a syncopal episode at work thought to be vasovagal related to diarrhea.  ED work-up was reassuring including EKG, negative troponins.  Neurological exam was reassuring-TIA or stroke thought unlikely. A/P: refilled vicodin today- hoping that can keep her at home managing- her old rx has expired.   -N[facility]SRS reviwed- has been on phentermine to try to help with weight loss from a clinic in [place], cough syrups in past, last hydrocodone from me was 2017 #10 and then #7 for shingles last year.  - controlled substance contract today

## 2018-05-06 NOTE — Progress Notes (Signed)
Phone: [phone]  Subjective:  Patient presents today for their annual physical. Chief complaint-noted.   See problem oriented charting- ROS- full  review of systems was completed and negative except for: headaches, diarrhea resolved on Monday, syncope from vasovagal syncope- seen in ED  The following were reviewed and entered/updated in epic: Past Medical History:  Diagnosis Date  . Allergy   . Anxiety   . Depression   . Hyperlipidemia   . Hypertension   . Obese    Patient Active Problem List   Diagnosis Date Noted  . Migraine headache with aura 10/03/2014    Priority: Medium  . Essential hypertension 07/03/2008    Priority: Medium  . Hyperlipidemia 01/08/2007    Priority: Medium  . Depression 01/08/2007    Priority: Medium  . History of total hysterectomy 05/06/2018    Priority: Low  . Hip bursitis 05/12/2016    Priority: Low  . 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 BENIGN POSITIONAL VERTIGO 05/22/2009    Priority: Low  . Obesity 01/24/2008    Priority: Low  . Allergic rhinitis 01/08/2007    Priority: Low   Past Surgical History:  Procedure Laterality Date  . ABDOMINAL HYSTERECTOMY    . carpal tunnel surgery bilaterally    . CHOLECYSTECTOMY    . left foot fascia release    . parotid gland surgery right side      Family History  Problem Relation Age of Onset  . Hypertension Mother   . Angina Mother        no stents  . Uterine cancer Mother   . Alzheimer's disease Mother        in facility since 2016  . Heart attack Father        mid 54s  . Glaucoma Father     Medications- reviewed and updated Current Outpatient Medications  Medication Sig Dispense Refill  . aspirin 81 MG EC tablet Take 81 mg by mouth daily.      Marland Kitchen aspirin-acetaminophen-caffeine (EXCEDRIN MIGRAINE) 250-250-65 MG tablet Take by mouth every 6 (six) hours as needed for headache.    . calcium carbonate (OS-CAL) 600 MG TABS Take 600 mg by mouth 2 (two) times daily with a  meal.      . citalopram (CELEXA) 10 MG tablet TAKE ONE TABLET BY MOUTH DAILY 90 tablet 1  . fish oil-omega-3 fatty acids 1000 MG capsule Take 1 g by mouth daily.      Marland Kitchen HYDROcodone-acetaminophen (NORCO/VICODIN) 5-325 MG tablet Take 1 tablet by mouth every 6 (six) hours as needed for moderate pain. 15 tablet 0  . loratadine (CLARITIN) 10 MG tablet Take 10 mg by mouth daily.    . Multiple Vitamin (MULTIVITAMIN) tablet Take 1 tablet by mouth daily.      . promethazine (PHENERGAN) 12.5 MG tablet Take 1 tablet (12.5 mg total) by mouth every 8 (eight) hours as needed for nausea or vomiting. 20 tablet 0  . simvastatin (ZOCOR) 20 MG tablet TAKE ONE TABLET BY MOUTH EVERY NIGHT AT BEDTIME 90 tablet 1  . atenolol (TENORMIN) 25 MG tablet Take 1 tablet (25 mg total) by mouth daily. 90 tablet 3  . hydrochlorothiazide (HYDRODIURIL) 25 MG tablet Take 1 tablet (25 mg total) by mouth daily as needed (edema). 30 tablet 1   No current facility-administered medications for this visit.     Allergies-reviewed and updated Allergies  Allergen Reactions  . Prednisone Other (See Comments)    Pt does not tolerate  oral prednisone-causes severe migraine.   Injections only    Social History   Social History Narrative   Lives with her husband. Their adult children live nearby- 3 children. 3 grandkids.       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 Oropharynx normal Neck: no thyromegaly CV: RRR no murmurs rubs or gallops Lungs: CTAB no crackles, wheeze, rhonchi Abdomen:  soft/nontender/nondistended/normal bowel sounds. No rebound or guarding.  Ext: no edema Skin: warm, dry Neuro: grossly normal, moves all extremities, PERRLA  Assessment/Plan:  62 y.o. female presenting for annual physical.  Health Maintenance counseling: 1. Anticipatory guidance: Patient counseled regarding regular dental exams -q6 months, eye exams - just had a few months ago, wearing seatbelts.  2. Risk factor reduction:  Advised patient of need for regular exercise and diet rich and fruits and vegetables to reduce risk of heart attack and stroke. Exercise- was walking every other day on the treadmill last year and has kept that up- she has slid down on this and wants to work to improve this again- helping with elderly parent and hasnt had as much time- dad 33. Diet-last year was on a weight loss programs or does any medical clinic but it was not a good fit for her-she wants to try to eat earlier in the evening and then go help with dad- plus she thinks if she adds walking back- goal to at least get 10-15 lbs down in next year.  Wt Readings from Last 3 Encounters:  05/06/18 230 lb 9.6 oz (104.6 kg)  04/30/18 226 lb 12.8 oz (102.9 kg)  09/24/17 227 lb 9.6 oz (103.2 kg)  3. Immunizations/screenings/ancillary studies- offered Shingrix - she has actually had at [facility]- she got her second.  Flu shot today already given. . Immunization History  Administered Date(s) Administered  . Influenza Split 04/15/2012  . Influenza Whole 08/16/2009  . Influenza,inj,Quad PF,6+ Mos 05/08/2014, 05/22/2015, 05/12/2016, 04/30/2017, 05/06/2018  . Pneumococcal Conjugate-13 05/22/2015  . Td 01/02/2004  . Tdap 05/05/2013  . Zoster Recombinat (Shingrix) 10/23/2017   4. Cervical cancer screening-  still getting pelvics/pap smears with Dr. Gertie Fey though has hysterectomy. Due around 1st of year 5. Breast cancer screening-  breast exam with Dr. Gertie Fey and mammogram in 2018- with Dr. Gertie Fey- we will try to get records.  6.  Colon cancer screening - 10/16/16 with [facility] with 10 year repeat 7. Skin cancer screening- no dermatologist. advised regular sunscreen use. Denies worrisome, changing, or new skin lesions.  8. Birth control/STD check- hysterectomy.  Monogamous 9. Osteoporosis screening at 80- done with Dr. Gertie Fey- will get records.  Status of chronic or acute concerns   Dad 91- helping care for him- placing a lot on her  She does home BP monitoring once a week  Essential hypertension S: controlled on atenolol 25mg  BP Readings from Last 3 Encounters:  05/06/18 132/80  05/03/18 (!) 152/74  04/30/18 118/86  A/P: Continue current meds.  discussed home monitoring to make sure pressure <140/90 as she only wants to come in once a year.  Some edema in feet for 3 weeks. On feet a lot- can be uncomfortable. She is already trying to do low salt diet, could try compression hoses. No SOB with it- doubt CHF. She wants to try hctz as she has in past with good success- can use up to 30 days- discussed extra potassium may be needed- likely can get this through diet   Hyperlipidemia S: mild poorly controlled on simvastatin 20 mg and krill oil. Last check but had improved with weight loss.  She is also on aspirin  A/P: continue simvastatin 20mg .  Continue to push for weight loss-could change to atorvastatin 20mg  if progress stalls in long term- I would say as long as LDL under 160 keep pushing for weight loss  Depression S: on celexa 10mg . Controlling depression.  A/P: continue current rx  Migraine headache with aura S: migraines- uses excedrin migraine for most part. Back up vicodin.  She did require Toradol injection in office 6 days ago.  She was given Phenergan to have on hand for nausea  3 days later she had a syncopal episode at work thought to be vasovagal related to diarrhea.  ED work-up was reassuring including EKG, negative troponins.  Neurological exam was reassuring-TIA or stroke thought  unlikely. A/P: refilled vicodin today- hoping that can keep her at home managing- her old rx has expired.   -NCCSRS reviwed- has been on phentermine to try to help with weight loss from a clinic in [place], cough syrups in past, last hydrocodone from me was 2017 #10 and then #7 for shingles last year.  - controlled substance contract today   Return in about 1 year (around 05/07/2019) for physical.  Lab/Order associations: Need for prophylactic vaccination and inoculation against influenza - Plan: Flu Vaccine QUAD 36+ mos IM  History of total hysterectomy  Essential hypertension  Hyperlipidemia, unspecified hyperlipidemia type  Recurrent major depressive disorder, in full remission ([place])  Migraine with aura and without status migrainosus, not intractable  Meds ordered this encounter  Medications  . atenolol (TENORMIN) 25 MG tablet    Sig: Take 1 tablet (25 mg total) by mouth daily.    Dispense:  90 tablet    Refill:  3  . hydrochlorothiazide (HYDRODIURIL) 25 MG tablet    Sig: Take 1 tablet (25 mg total) by mouth daily as needed (edema).    Dispense:  30 tablet    Refill:  1  . HYDROcodone-acetaminophen (NORCO/VICODIN) 5-325 MG tablet    Sig: Take 1 tablet by mouth every 6 (six) hours as needed for moderate pain.    Dispense:  15 tablet    Refill:  0    Return precautions advised.  Garret Reddish, MD

## 2018-05-06 NOTE — Assessment & Plan Note (Signed)
S: mild poorly controlled on simvastatin 20 mg and krill oil. Last check but had improved with weight loss.  She is also on aspirin  A/P: continue simvastatin 20mg .  Continue to push for weight loss-could change to atorvastatin 20mg  if progress stalls in long term- I would say as long as LDL under 160 keep pushing for weight loss

## 2018-05-06 NOTE — Assessment & Plan Note (Signed)
S: on celexa 10mg . Controlling depression.  A/P: continue current rx

## 2018-05-06 NOTE — Assessment & Plan Note (Signed)
S: controlled on atenolol 25mg  BP Readings from Last 3 Encounters:  05/06/18 132/80  05/03/18 (!) 152/74  04/30/18 118/86  A/P: Continue current meds. discussed home monitoring to make sure pressure <140/90 as she only wants to come in once a year.  Some edema in feet for 3 weeks. On feet a lot- can be uncomfortable. She is already trying to do low salt diet, could try compression hoses. No SOB with it- doubt CHF. She wants to try hctz as she has in past with good success- can use up to 30 days- discussed extra potassium may be needed- likely can get this through diet

## 2018-05-06 NOTE — Patient Instructions (Addendum)
Sign release of information at the check out desk for mammogram and bone density (most recent of each) from Dr. Clementeen Hoof office  she wants to try to eat earlier in the evening and then go help with dad- plus she thinks if she adds walking back- goal to at least get 10-15 lbs down in next year.   Team- [id number] today please update this and give me a copy of [place] and she needs pain contract for vicodin  She wants to try hctz as she has in past with good success- can use up to 30 days- discussed extra potassium may be needed- likely can get this through diet  I would like your blood pressure to consistently be <140/90 on weekly home checks

## 2018-05-07 ENCOUNTER — Telehealth: Payer: Self-pay | Admitting: *Deleted

## 2018-05-07 NOTE — Telephone Encounter (Signed)
Copied from [place] [phone]. Topic: General - Other >> May 06, 2018  5:37 PM Ahmed Prima L wrote: Reason for CRM: Patient's husband called and said she had her shingle shot at [place] at the first of the year and the second one at [employer] in march. This is just an [place].

## 2018-05-10 NOTE — Telephone Encounter (Signed)
Noted  

## 2018-05-24 ENCOUNTER — Encounter: Payer: Self-pay | Admitting: Family Medicine

## 2018-07-13 DIAGNOSIS — M7061 Trochanteric bursitis, right hip: Secondary | ICD-10-CM | POA: Diagnosis not present

## 2018-08-23 DIAGNOSIS — J111 Influenza due to unidentified influenza virus with other respiratory manifestations: Secondary | ICD-10-CM | POA: Diagnosis not present

## 2018-09-01 ENCOUNTER — Other Ambulatory Visit: Payer: Self-pay | Admitting: Family Medicine

## 2018-10-12 ENCOUNTER — Other Ambulatory Visit: Payer: Self-pay | Admitting: Family Medicine

## 2018-10-12 DIAGNOSIS — F3342 Major depressive disorder, recurrent, in full remission: Secondary | ICD-10-CM

## 2018-10-13 NOTE — Telephone Encounter (Signed)
Rx Request 

## 2018-11-28 ENCOUNTER — Other Ambulatory Visit: Payer: Self-pay | Admitting: Family Medicine

## 2018-12-21 DIAGNOSIS — Z1231 Encounter for screening mammogram for malignant neoplasm of breast: Secondary | ICD-10-CM | POA: Diagnosis not present

## 2018-12-21 DIAGNOSIS — Z6837 Body mass index (BMI) 37.0-37.9, adult: Secondary | ICD-10-CM | POA: Diagnosis not present

## 2018-12-21 DIAGNOSIS — Z01419 Encounter for gynecological examination (general) (routine) without abnormal findings: Secondary | ICD-10-CM | POA: Diagnosis not present

## 2018-12-22 ENCOUNTER — Ambulatory Visit: Payer: Self-pay | Admitting: Family Medicine

## 2018-12-22 ENCOUNTER — Ambulatory Visit (INDEPENDENT_AMBULATORY_CARE_PROVIDER_SITE_OTHER): Payer: BLUE CROSS/BLUE SHIELD | Admitting: Family Medicine

## 2018-12-22 ENCOUNTER — Encounter: Payer: Self-pay | Admitting: Family Medicine

## 2018-12-22 VITALS — Temp 98.5°F

## 2018-12-22 DIAGNOSIS — J029 Acute pharyngitis, unspecified: Secondary | ICD-10-CM | POA: Diagnosis not present

## 2018-12-22 MED ORDER — AZELASTINE HCL 0.1 % NA SOLN: 2.0000 | Freq: Two times a day (BID) | NASAL | 12 refills | Status: DC

## 2018-12-22 MED ORDER — PREDNISOLONE SODIUM PHOSPHATE 15 MG/5ML PO SOLN: 45.0000 mg | Freq: Every day | ORAL | 0 refills | Status: AC

## 2018-12-22 NOTE — Telephone Encounter (Signed)
Pt. Reports she woke up in the middle of the night with a sore throat and "bad headache." "I just feel bad." Denies fever or cough. Warm transfer to [facility] in the office for a virtual visit.  Answer Assessment - Initial Assessment Questions 1. ONSET: "When did the throat start hurting?" (Hours or days ago)      Started last night 2. SEVERITY: "How bad is the sore throat?" (Scale 1-10; mild, moderate or severe)   - MILD (1-3):  doesn't interfere with eating or normal activities   - MODERATE (4-7): interferes with eating some solids and normal activities   - SEVERE (8-10):  excruciating pain, interferes with most normal activities   - SEVERE DYSPHAGIA: can't swallow liquids, drooling     Moderate 3. STREP EXPOSURE: "Has there been any exposure to strep within the past week?" If so, ask: "What type of contact occurred?"      No 4.  VIRAL SYMPTOMS: "Are there any symptoms of a cold, such as a runny nose, cough, hoarse voice or red eyes?"      Runny nose 5. FEVER: "Do you have a fever?" If so, ask: "What is your temperature, how was it measured, and when did it start?"     No 6. PUS ON THE TONSILS: "Is there pus on the tonsils in the back of your throat?"     No 7. OTHER SYMPTOMS: "Do you have any other symptoms?" (e.g., difficulty breathing, headache, rash)     Headache 8. PREGNANCY: "Is there any chance you are pregnant?" "When was your last menstrual period?"     No  Protocols used: SORE THROAT-A-AH

## 2018-12-22 NOTE — Telephone Encounter (Signed)
See note

## 2018-12-22 NOTE — Progress Notes (Signed)
    Chief Complaint:  Rachel Gates is a 63 y.o. female who presents today for a virtual office visit with a chief complaint of sore throat.   Assessment/Plan:  Sore Throat Likely viral URI.  No signs of COVID-19 or bacterial infection.  Will start course of prednisolone for her sore throat.  Also start Astelin nasal spray for rhinorrhea/postnasal drip.  Encouraged good oral hydration.  Recommended continue over-the-counter analgesics as needed.  Discussed reasons to return to care.  Follow up as needed.     Subjective:  HPI:  Sore Throat Started about a day ago.  Associated symptoms include headache and malaise.  No fevers or cough.  No sick contacts.  Tried ibuprofen with modest improvement.  Some rhinorrhea and postnasal drip.  Does not feel like typical seasonal allergies.  No other obvious alleviating or aggravating factors.  ROS: Per HPI  PMH: She reports that she has never smoked. She has never used smokeless tobacco. She reports that she does not drink alcohol or use drugs.      Objective/Observations  Physical Exam: Gen: NAD, resting comfortably ENT: OP erythematous.  No exudate.. Pulm: Normal work of breathing Neuro: Grossly normal, moves all extremities Psych: Normal affect and thought content  Virtual Visit via Video   I connected with Rachel Gates on 12/22/18 at  9:00 AM EDT by a video enabled telemedicine application and verified that I am speaking with the correct person using two identifiers. I discussed the limitations of evaluation and management by telemedicine and the availability of in person appointments. The patient expressed understanding and agreed to proceed.   Patient location: Home Provider location: [place] participating in the virtual visit: Myself and Patient     Algis Greenhouse. Jerline Pain, MD 12/22/2018 8:49 AM

## 2018-12-22 NOTE — Telephone Encounter (Signed)
Patient scheduled.

## 2018-12-23 ENCOUNTER — Telehealth: Payer: Self-pay | Admitting: Family Medicine

## 2018-12-23 NOTE — Telephone Encounter (Signed)
Letter has been written and faxed

## 2018-12-23 NOTE — Telephone Encounter (Signed)
Copied from [place]. Topic: General - Inquiry >> Dec 23, 2018 12:39 PM Rutherford Nail, [place] wrote: Reason for CRM: Patient calling and states that she was seen virtually yesterday (12/22/2018) with Dr Jerline Pain. Would like to know if she could get a work note stating that she had a visit with him? Please advise.  Fax#: [place] CB#: [phone]

## 2019-02-27 ENCOUNTER — Other Ambulatory Visit: Payer: Self-pay | Admitting: Family Medicine

## 2019-04-20 ENCOUNTER — Encounter: Payer: Self-pay | Admitting: *Deleted

## 2019-04-20 ENCOUNTER — Ambulatory Visit (INDEPENDENT_AMBULATORY_CARE_PROVIDER_SITE_OTHER): Payer: BC Managed Care – PPO | Admitting: Family Medicine

## 2019-04-20 ENCOUNTER — Encounter: Payer: Self-pay | Admitting: Family Medicine

## 2019-04-20 ENCOUNTER — Other Ambulatory Visit: Payer: Self-pay

## 2019-04-20 VITALS — Temp 100.0°F

## 2019-04-20 DIAGNOSIS — A084 Viral intestinal infection, unspecified: Secondary | ICD-10-CM | POA: Diagnosis not present

## 2019-04-20 MED ORDER — PROMETHAZINE HCL 12.5 MG PO TABS: 12.5000 mg | ORAL_TABLET | Freq: Three times a day (TID) | ORAL | 0 refills | Status: DC | PRN

## 2019-04-20 NOTE — Patient Instructions (Signed)
Please follow up if symptoms do not improve or as needed.   

## 2019-04-20 NOTE — Progress Notes (Signed)
Virtual Visit via Video Note  Subjective  CC:  Chief Complaint  Patient presents with  . Flu like symptoms    Started yesterday.. Nausea, stomach pain, diarrhea, no appetite, no energy, and headache     I connected with Rachel Gates on 04/20/19 at  1:00 PM EDT by a video enabled telemedicine application and verified that I am speaking with the correct person using two identifiers. Location patient: Home Location provider: [facility] [place], Office Persons participating in the virtual visit: Majesta Gates, Leamon Arnt, MD Lilli Light, [place] discussed the limitations of evaluation and management by telemedicine and the availability of in person appointments. The patient expressed understanding and agreed to proceed.  HPI: Rachel Gates is a 63 y.o. female who presents via telemedicine for evaluation of diarrhea several times per day and nausea with vomiting.  Symptoms have been present for 1 day. Patient denies blood in stool, dysuria, fever, hematemesis, melena and mucoid stool. Patient's oral intake has been decreased but keeping down fluids. Patient's urine output has been adequate. No contacts with similar symptoms. Patient denies recent travel history. Patient has not had recent ingestion of possible contaminated food, toxic plants, or inappropriate medications/poisons. No recent use of antibiotics. She denies orthostatic symptoms.    Assessment  1. Viral gastroenteritis      Plan   Gastroenteritis:  Likely viral etiology given above hx and physical findings. Educated on prognosis and typical course of illness. Need to avoid dehydration. Ordered medications to assist in that if needed and discussed pushing PO fluids. See AVS for GE information.   Push fluids. Clear liquid diet with increased fluids intake advancing ot BRATS diet and then regular as tolerated. Avoid dair products and caffeine until resolved.   Follow up: Return in 5-7 days if not  resolved. Sooner if worsening.   I discussed the assessment and treatment plan with the patient. The patient was provided an opportunity to ask questions and all were answered. The patient agreed with the plan and demonstrated an understanding of the instructions.   The patient was advised to call back or seek an in-person evaluation if the symptoms worsen or if the condition fails to improve as anticipated. Follow up: prn  05/11/2019  No orders of the defined types were placed in this encounter.     I reviewed the patients updated PMH, FH, and SocHx.    Patient Active Problem List   Diagnosis Date Noted  . History of total hysterectomy 05/06/2018  . Hip bursitis 05/12/2016  . Migraine headache with aura 10/03/2014  . 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 BENIGN POSITIONAL VERTIGO 05/22/2009  . Essential hypertension 07/03/2008  . Obesity 01/24/2008  . Hyperlipidemia 01/08/2007  . Depression 01/08/2007  . Allergic rhinitis 01/08/2007   Current Meds  Medication Sig  . aspirin 81 MG EC tablet Take 81 mg by mouth daily.    Marland Kitchen aspirin-acetaminophen-caffeine (EXCEDRIN MIGRAINE) 250-250-65 MG tablet Take by mouth every 6 (six) hours as needed for headache.  Marland Kitchen atenolol (TENORMIN) 25 MG tablet Take 1 tablet (25 mg total) by mouth daily.  Marland Kitchen azelastine (ASTELIN) 0.1 % nasal spray Place 2 sprays into both nostrils 2 (two) times daily. Use in each nostril as directed  . calcium carbonate (OS-CAL) 600 MG TABS Take 600 mg by mouth 2 (two) times daily with a meal.    . citalopram (CELEXA) 10 MG tablet TAKE ONE TABLET BY MOUTH DAILY  . fish  oil-omega-3 fatty acids 1000 MG capsule Take 1 g by mouth daily.    . hydrochlorothiazide (HYDRODIURIL) 25 MG tablet Take 1 tablet (25 mg total) by mouth daily as needed (edema).  . loratadine (CLARITIN) 10 MG tablet Take 10 mg by mouth daily.  . Multiple Vitamin (MULTIVITAMIN) tablet Take 1 tablet by mouth daily.    . promethazine (PHENERGAN) 12.5 MG tablet Take 1  tablet (12.5 mg total) by mouth every 8 (eight) hours as needed for nausea or vomiting.  . simvastatin (ZOCOR) 20 MG tablet TAKE ONE TABLET BY MOUTH EVERY NIGHT AT BEDTIME    Allergies: Patient is allergic to prednisone. Family History: Patient family history includes Alzheimer's disease in her mother; Angina in her mother; Glaucoma in her father; Heart attack in her father; Hypertension in her mother; Uterine cancer in her mother. Social History:  Patient  reports that she has never smoked. She has never used smokeless tobacco. She reports that she does not drink alcohol or use drugs.  Review of Systems: Constitutional: Negative for fever malaise or anorexia Cardiovascular: negative for chest pain Respiratory: negative for SOB or persistent cough Gastrointestinal: positive for mild crampy abdominal pain  OBJECTIVE Vitals: Temp 100 F (37.8 C) (Oral)   LMP  (LMP Unknown)  General: no acute distress , A&Ox3  Leamon Arnt, MD

## 2019-04-21 ENCOUNTER — Telehealth: Payer: Self-pay | Admitting: Family Medicine

## 2019-04-21 ENCOUNTER — Encounter: Payer: Self-pay | Admitting: *Deleted

## 2019-04-21 DIAGNOSIS — J029 Acute pharyngitis, unspecified: Secondary | ICD-10-CM

## 2019-04-21 DIAGNOSIS — A084 Viral intestinal infection, unspecified: Secondary | ICD-10-CM

## 2019-04-21 NOTE — Telephone Encounter (Signed)
Patient is calling her fever just broke today. So she did not return back to work.  Patient is request another note to return back to work for 04/22/2019.   When the letter is completed. Can she be notified sot that her husband can pick up the note today.  Please advise CB [phone]- Home

## 2019-04-21 NOTE — Telephone Encounter (Signed)
I would order COVID-19 testing to be on the safe side before return to work since she is reporting fever.  Please help set her up for this at [facility] and hopefully will have results back for Monday- can write work note stating we are awaiting COVID-19 testing before allowing him to return to work

## 2019-04-21 NOTE — Telephone Encounter (Signed)
OK to provide work note with return date of 04/22/19?

## 2019-04-22 ENCOUNTER — Other Ambulatory Visit: Payer: Self-pay

## 2019-04-22 DIAGNOSIS — R6889 Other general symptoms and signs: Secondary | ICD-10-CM | POA: Diagnosis not present

## 2019-04-22 DIAGNOSIS — Z20822 Contact with and (suspected) exposure to covid-19: Secondary | ICD-10-CM

## 2019-04-22 NOTE — Telephone Encounter (Signed)
Called and spoke with patient and advised. She says that her husband already picked up work note provided by Dr. Jonni Sanger since she did her virtual visit with her. She would like order placed for Covid testing. Advised of testing site location and hours. Order placed.

## 2019-04-24 LAB — NOVEL CORONAVIRUS, NAA: SARS-CoV-2, NAA: NOT DETECTED

## 2019-05-09 NOTE — Progress Notes (Signed)
Phone: [phone]   Subjective:  Patient presents today for their annual physical. Chief complaint-noted.   See problem oriented charting- ROS- full  review of systems was completed and negative except for: wheezing, seasonal allergies.  The following were reviewed and entered/updated in epic: Past Medical History:  Diagnosis Date  . Allergy   . Anxiety   . Depression   . Hyperlipidemia   . Hypertension   . Obese    Patient Active Problem List   Diagnosis Date Noted  . Migraine headache with aura 10/03/2014    Priority: Medium  . Essential hypertension 07/03/2008    Priority: Medium  . Hyperlipidemia 01/08/2007    Priority: Medium  . Depression 01/08/2007    Priority: Medium  . History of total hysterectomy 05/06/2018    Priority: Low  . Hip bursitis 05/12/2016    Priority: Low  . 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 BENIGN POSITIONAL VERTIGO 05/22/2009    Priority: Low  . Obesity 01/24/2008    Priority: Low  . Allergic rhinitis 01/08/2007    Priority: Low   Past Surgical History:  Procedure Laterality Date  . ABDOMINAL HYSTERECTOMY    . carpal tunnel surgery bilaterally    . CHOLECYSTECTOMY    . left foot fascia release    . parotid gland surgery right side      Family History  Problem Relation Age of Onset  . Hypertension Mother   . Angina Mother        no stents  . Uterine cancer Mother   . Alzheimer's disease Mother        in facility since 2016  . Heart attack Father        mid 15s  . Glaucoma Father     Medications- reviewed and updated Current Outpatient Medications  Medication Sig Dispense Refill  . aspirin 81 MG EC tablet Take 81 mg by mouth daily.      Marland Kitchen aspirin-acetaminophen-caffeine (EXCEDRIN MIGRAINE) 250-250-65 MG tablet Take by mouth every 6 (six) hours as needed for headache.    Marland Kitchen atenolol (TENORMIN) 25 MG tablet Take 1 tablet (25 mg total) by mouth daily. 90 tablet 3  . azelastine (ASTELIN) 0.1 % nasal spray Place 2  sprays into both nostrils 2 (two) times daily. Use in each nostril as directed 30 mL 12  . calcium carbonate (OS-CAL) 600 MG TABS Take 600 mg by mouth 2 (two) times daily with a meal.      . citalopram (CELEXA) 10 MG tablet TAKE ONE TABLET BY MOUTH DAILY 90 tablet 3  . fish oil-omega-3 fatty acids 1000 MG capsule Take 1 g by mouth daily.      . hydrochlorothiazide (HYDRODIURIL) 25 MG tablet Take 1 tablet (25 mg total) by mouth daily as needed (edema). 30 tablet 1  . loratadine (CLARITIN) 10 MG tablet Take 10 mg by mouth daily.    . Multiple Vitamin (MULTIVITAMIN) tablet Take 1 tablet by mouth daily.      . promethazine (PHENERGAN) 12.5 MG tablet Take 1 tablet (12.5 mg total) by mouth every 8 (eight) hours as needed for nausea or vomiting. 20 tablet 0  . simvastatin (ZOCOR) 20 MG tablet TAKE ONE TABLET BY MOUTH EVERY NIGHT AT BEDTIME 90 tablet 0   No current facility-administered medications for this visit.     Allergies-reviewed and updated Allergies  Allergen Reactions  . Prednisone Other (See Comments)    Pt does not tolerate oral prednisone-causes severe migraine.  Injections only    Social History   Social History Narrative   Lives with her husband. Their adult children live nearby- 3 children. 3 grandkids.       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 Oropharynx normal Neck: no thyromegaly or cervical lymphadenopathy CV: RRR no murmurs rubs or gallops Lungs: CTAB no crackles, wheeze, rhonchi Abdomen: soft/nontender/nondistended/normal bowel sounds. No rebound or guarding.  Ext: 1+ edema  Skin: warm, dry Neuro: grossly normal, moves all extremities, PERRLA   Assessment and Plan   63 y.o. female presenting for annual physical.  Health Maintenance counseling: 1. Anticipatory guidance: Patient counseled regarding regular dental exams -q6 months, eye exams - yearly,  avoiding smoking and second hand smoke , limiting alcohol to 1 beverage per day- very rare .   2. Risk factor reduction:  Advised patient of need for regular exercise and diet rich and fruits and vegetables to reduce risk of heart attack and stroke. Exercise- trying to do some walking.. Diet- feels like she needs to eat earlier- eats late in evening and having to pick tings up after caring for dad, is trying to do some meal prep on weekend  Has been able to keep weight stable over the last year. Wt Readings from Last 3 Encounters:  05/11/19 240 lb 9.6 oz (109.1 kg)  05/06/18 230 lb 9.6 oz (104.6 kg)  04/30/18 226 lb 12.8 oz (102.9 kg)  3. Immunizations/screenings/ancillary studies-flu shot today.She had Shingrix- need copy of second injection from her scooter Immunization History  Administered Date(s) Administered  . Influenza Split 04/15/2012  . Influenza Whole 08/16/2009  . Influenza,inj,Quad PF,6+ Mos 05/08/2014, 05/22/2015, 05/12/2016, 04/30/2017, 05/06/2018, 05/11/2019  . Influenza,inj,quad, With Preservative 04/11/2018  . Pneumococcal Conjugate-13 05/22/2015  . Td 01/02/2004  . Tdap 05/05/2013  . Zoster Recombinat (Shingrix) 10/23/2017  4. Cervical cancer screening-history of hysterectomy-still follows with Dr. Gertie Fey who decides on Pap smears 5. Breast cancer screening-  breast exam with gynecology and mammogram  09/04/17 on file- had one in march of this year  6. Colon cancer screening - 10/16/2016 with [facility].  10-year repeat planned 7. Skin cancer screening-no dermatologist. advised regular sunscreen use. Denies worrisome, changing, or new skin lesions- does have SK on left side.  8. Birth  control/STD check- hysterectomy/monogamous 9. Osteoporosis screening at 15- has completed with gynecology- already scheduled for another and she will ask them to send records -Never smoker  Status of chronic or acute concerns   Father is 28 years old and lives in [place] is helping to care for him. Unfortunately he was scammed multiple times- they continue to advoicate for him.   HTN: poorly controlled on atenolol 25mg , HCTZ 25mg  , pt states BPs have been going good at home-- 88/68 (not sure of accurancy of cuff) . Pt denies HA, blurred vision and chest discomfort. Pt states she is not able to get out and exercise and she has gained weight, she is eating later and has less activity.  Hyperlipidemia: controlled on Simvastatin 20mg , pt states she is tolerating statin well.  Update lipid panel today  Depression: Celexa 10mg -depression is in full remission Depression screen [facility] 2/9 05/11/2019  Decreased Interest 0  Down, Depressed, Hopeless 0  PHQ - 2 Score 0  Altered sleeping 0  Tired, decreased energy 1  Change in appetite 0  Feeling bad or failure about yourself  0  Trouble concentrating 0  Moving slowly or fidgety/restless 0  Suicidal thoughts 0  PHQ-9 Score 1  Difficult doing work/chores -   Bursitis- pt states she is having bursitis flare ups and has had injections and is doing the exercises Dr. Delfino Lovett gave her along with Ibuprofen - she is using ibuprofen morning and night -encouraged to follow up with Dr. Delfino Lovett and discussed risks of long term nsaids  Swelling/numbness- pt states she has swelling and numbness in both feet and hands.  Will check labs for any obvious cause of edema.  She was try to do a low-salt diet in the past- could improve on this  Have advised could try compression stockings in the past- does get mild improvement with these.  No shortness of breath with this. -also gets some tingling and numbness in feet- some at prior surgical site but also similar in  other foot without surgery - b12, tsh, a1c will be checked- if significantly worsens could refer to neurology  Migraines with aura- uses sparing Vicodin in past (hasnt needed lately!) but primarily uses Excedrin.  Also uses phentermine with headaches.  Seasonal allergies- doing allegra and claritin. Also does flonase. Occasional gets wheezing in upper airway still. No shortness of breath.   Recommended follow up: 6 month in person, 1 month mychart update No future appointments.  Lab/Order associations: fasting   ICD-10-CM   1. Preventative health care  Z00.00 Lipid Panel    Comprehensive metabolic panel    CBC (no diff)    TSH  2. Essential hypertension  I10 Lipid Panel    Comprehensive metabolic panel    CBC (no diff)  3. Hyperlipidemia, unspecified hyperlipidemia type  E78.5 Lipid Panel    Comprehensive metabolic panel    CBC (no diff)    TSH  4. Recurrent major depressive disorder, in full remission ([place])  F33.42   5. Need for immunization against influenza  Z23 Flu Vaccine QUAD 36+ mos IM  6. Obesity (BMI 30-39.9)  E66.9 Hemoglobin A1c  7. Screening for diabetes mellitus  Z13.1 Hemoglobin A1c  8. Paresthesia  R20.2 Hemoglobin A1c    Vitamin B12    No orders of the defined types were placed in this encounter.   Return precautions advised.  Garret Reddish, MD

## 2019-05-09 NOTE — Patient Instructions (Addendum)
Health Maintenance Due  Topic Date Due  . INFLUENZA VACCINE -thanks for doing this!  03/12/2019   Could try myfitnesspal.  Sign release of information at the check out desk for immunizations from [facility]  Blood pressure is running higher- lets see if reducing ibuprofen, increasing activity, lowering salt in diet can bring # down instead of adding medicine. I want you to consider getting a new cuff and monitoring at home- omron series 3 is pretty cost effective around $33. Update me in 1 month with how pressures are doing at home. Usually I know we check in yearly but perhaps 6 months this time to just get a recheck on blood pressure in office as well- and bring your cuff with you   Please stop by lab before you go If you do not have mychart- we will call you about results within 5 business days of [place] receiving them.  If you have mychart- we will send your results within 3 business days of [place] receiving them.  If abnormal or we want to clarify a result, we will call or mychart you to make sure you receive the message.  If you have questions or concerns or don't hear within 5-7 days, please send [place] a message or call us.

## 2019-05-11 ENCOUNTER — Encounter: Payer: Self-pay | Admitting: Family Medicine

## 2019-05-11 ENCOUNTER — Other Ambulatory Visit: Payer: Self-pay

## 2019-05-11 ENCOUNTER — Ambulatory Visit (INDEPENDENT_AMBULATORY_CARE_PROVIDER_SITE_OTHER): Payer: BC Managed Care – PPO | Admitting: Family Medicine

## 2019-05-11 VITALS — BP 142/94 | HR 66 | Temp 97.4°F | Ht 67.0 in | Wt 240.6 lb

## 2019-05-11 DIAGNOSIS — Z131 Encounter for screening for diabetes mellitus: Secondary | ICD-10-CM | POA: Diagnosis not present

## 2019-05-11 DIAGNOSIS — E785 Hyperlipidemia, unspecified: Secondary | ICD-10-CM

## 2019-05-11 DIAGNOSIS — R202 Paresthesia of skin: Secondary | ICD-10-CM | POA: Diagnosis not present

## 2019-05-11 DIAGNOSIS — Z0000 Encounter for general adult medical examination without abnormal findings: Secondary | ICD-10-CM

## 2019-05-11 DIAGNOSIS — E669 Obesity, unspecified: Secondary | ICD-10-CM

## 2019-05-11 DIAGNOSIS — I10 Essential (primary) hypertension: Secondary | ICD-10-CM

## 2019-05-11 DIAGNOSIS — F3342 Major depressive disorder, recurrent, in full remission: Secondary | ICD-10-CM

## 2019-05-11 DIAGNOSIS — Z23 Encounter for immunization: Secondary | ICD-10-CM

## 2019-05-11 LAB — COMPREHENSIVE METABOLIC PANEL
ALT: 15 U/L (ref 0–35)
AST: 18 U/L (ref 0–37)
Albumin: 4.5 g/dL (ref 3.5–5.2)
Alkaline Phosphatase: 70 U/L (ref 39–117)
BUN: 16 mg/dL (ref 6–23)
CO2: 28 mEq/L (ref 19–32)
Calcium: 9.7 mg/dL (ref 8.4–10.5)
Chloride: 103 mEq/L (ref 96–112)
Creatinine, Ser: 0.61 mg/dL (ref 0.40–1.20)
GFR: 98.56 mL/min (ref >60.00)
Glucose, Bld: 95 mg/dL (ref 70–99)
Potassium: 4.3 mEq/L (ref 3.5–5.1)
Sodium: 141 mEq/L (ref 135–145)
Total Bilirubin: 0.8 mg/dL (ref 0.2–1.2)
Total Protein: 6.9 g/dL (ref 6.0–8.3)

## 2019-05-11 LAB — HEMOGLOBIN A1C: Hgb A1c MFr Bld: 5.9 % (ref 4.6–6.5)

## 2019-05-11 LAB — CBC
HCT: 44 % (ref 36.0–46.0)
Hemoglobin: 14.8 g/dL (ref 12.0–15.0)
MCHC: 33.5 g/dL (ref 30.0–36.0)
MCV: 94 fl (ref 78.0–100.0)
Platelets: 228 10*3/uL (ref 150.0–400.0)
RBC: 4.68 Mil/uL (ref 3.87–5.11)
RDW: 13.2 % (ref 11.5–15.5)
WBC: 8.3 10*3/uL (ref 4.0–10.5)

## 2019-05-11 LAB — VITAMIN B12: Vitamin B-12: >1500 pg/mL — ABNORMAL HIGH (ref 211–911)

## 2019-05-11 LAB — LIPID PANEL
Cholesterol: 172 mg/dL (ref 0–200)
HDL: 36 mg/dL — ABNORMAL LOW (ref >39.00)
NonHDL: 136.45
Total CHOL/HDL Ratio: 5
Triglycerides: 276 mg/dL — ABNORMAL HIGH (ref 0.0–149.0)
VLDL: 55.2 mg/dL — ABNORMAL HIGH (ref 0.0–40.0)

## 2019-05-11 LAB — TSH: TSH: 1.91 u[IU]/mL (ref 0.35–4.50)

## 2019-05-11 LAB — LDL CHOLESTEROL, DIRECT: Direct LDL: 103 mg/dL

## 2019-05-14 ENCOUNTER — Other Ambulatory Visit: Payer: Self-pay | Admitting: Family Medicine

## 2019-05-26 ENCOUNTER — Other Ambulatory Visit: Payer: Self-pay | Admitting: Family Medicine

## 2019-06-28 ENCOUNTER — Other Ambulatory Visit: Payer: Self-pay

## 2019-06-28 DIAGNOSIS — Z20822 Contact with and (suspected) exposure to covid-19: Secondary | ICD-10-CM

## 2019-06-30 LAB — NOVEL CORONAVIRUS, NAA: SARS-CoV-2, NAA: NOT DETECTED

## 2019-07-01 ENCOUNTER — Other Ambulatory Visit: Payer: Self-pay | Admitting: Family Medicine

## 2019-07-01 DIAGNOSIS — F3342 Major depressive disorder, recurrent, in full remission: Secondary | ICD-10-CM

## 2019-08-12 ENCOUNTER — Other Ambulatory Visit: Payer: Self-pay | Admitting: Family Medicine

## 2019-08-27 DIAGNOSIS — R635 Abnormal weight gain: Secondary | ICD-10-CM | POA: Diagnosis not present

## 2019-08-27 DIAGNOSIS — R0602 Shortness of breath: Secondary | ICD-10-CM | POA: Diagnosis not present

## 2019-08-27 DIAGNOSIS — R5383 Other fatigue: Secondary | ICD-10-CM | POA: Diagnosis not present

## 2019-08-27 DIAGNOSIS — I10 Essential (primary) hypertension: Secondary | ICD-10-CM | POA: Diagnosis not present

## 2019-11-07 ENCOUNTER — Ambulatory Visit (HOSPITAL_COMMUNITY)
Admission: RE | Admit: 2019-11-07 | Discharge: 2019-11-07 | Disposition: A | Discharge status: Home / Self Care | Payer: Medicare Other | Source: Ambulatory Visit | Attending: Family Medicine | Admitting: Family Medicine

## 2019-11-07 ENCOUNTER — Other Ambulatory Visit: Payer: Self-pay

## 2019-11-07 ENCOUNTER — Encounter: Payer: Self-pay | Admitting: Family Medicine

## 2019-11-07 ENCOUNTER — Telehealth: Payer: Self-pay | Admitting: Family Medicine

## 2019-11-07 ENCOUNTER — Ambulatory Visit (INDEPENDENT_AMBULATORY_CARE_PROVIDER_SITE_OTHER): Payer: Medicare Other | Admitting: Family Medicine

## 2019-11-07 VITALS — BP 124/80 | HR 72 | Temp 97.1°F | Ht 67.0 in | Wt 224.0 lb

## 2019-11-07 DIAGNOSIS — M79662 Pain in left lower leg: Secondary | ICD-10-CM

## 2019-11-07 DIAGNOSIS — R202 Paresthesia of skin: Secondary | ICD-10-CM

## 2019-11-07 DIAGNOSIS — R200 Anesthesia of skin: Secondary | ICD-10-CM

## 2019-11-07 MED ORDER — APIXABAN (ELIQUIS) VTE STARTER PACK (10MG AND 5MG): ORAL_TABLET | ORAL | 0 refills | Status: DC

## 2019-11-07 NOTE — Patient Instructions (Signed)
1) sending you for stat ultrasound for your leg to rule out DVT. Clinically low suspicion, but you had a positive test called the homan sign. If this is positive, go and pick up blood thinner I sent and start this. I want you to call dr. Yong Channel for follow up if positive.   2) if negative, get voltaren gel over the counter and start rubbing on calf 4x/day. I will refer you to sport medicine as I think you pulled a calf muscle.   3) checking lots of labs too for cause!   So nice to meet you!  Dr. Rogers Blocker

## 2019-11-07 NOTE — Progress Notes (Addendum)
Patient: Rachel Gates MRN: [id number] DOB: 1955/11/16 PCP: Marin Olp, MD     Subjective:  Chief Complaint  Patient presents with  . Leg Injury    Left; Twisted leg on Wednesday. Pt stepped down and twisted her leg. She has some shooting pain in her calf and lower thigh.  . Tingling    slight     HPI: The patient is a 64 y.o. female who presents today for leg injury. She states on Wednesday she was getting out of her car and when she stood up she had really sharp pain in her calf and behind her knee. She was walking around store and she had some sharp pain. She took some ibuprofen and this helped. It was still painful during week, but tolerable. On Saturday night, she could not put any weight on her left leg at all. Saturday night she had tingling in her toes and heel. No numbness. She states that now comes and goes. She states the pain in her calf will come and go. Pain is rated as a 9-10/10 and is like a burning pain that will last for 10 seconds then goes away. Only happens if she is standing or walking. She had to take a vicodin Friday night to help the pain. No swelling or warmth to the calf. No surgery or recent immobilization. She does not smoke and no HRT.   Review of Systems  Constitutional: Negative for chills, fatigue and fever.  Respiratory: Negative for cough and shortness of breath.   Cardiovascular: Negative for chest pain and leg swelling.  Neurological: Negative for dizziness, light-headedness and headaches.    Allergies Patient is allergic to prednisone.  Past Medical History Patient  has a past medical history of Allergy, Anxiety, Depression, Hyperlipidemia, Hypertension, and Obese.  Surgical History Patient  has a past surgical history that includes Abdominal hysterectomy; Cholecystectomy; parotid gland surgery right side; carpal tunnel surgery bilaterally; and left foot fascia release.  Family History Pateint's family history includes Alzheimer's  disease in her mother; Angina in her mother; Glaucoma in her father; Heart attack in her father; Hypertension in her mother; Uterine cancer in her mother.  Social History Patient  reports that she has never smoked. She has never used smokeless tobacco. She reports that she does not drink alcohol or use drugs.    Objective: Vitals:   11/07/19 1453  BP: 124/80  Pulse: 72  Temp: (!) 97.1 F (36.2 C)  TempSrc: Temporal  SpO2: 97%  Weight: 224 lb (101.6 kg)  Height: 5\' 7"  (1.702 m)    Body mass index is 35.08 kg/m.  Physical Exam Vitals reviewed.  Constitutional:      General: She is not in acute distress.    Appearance: Normal appearance. She is not ill-appearing.  HENT:     Head: Normocephalic and atraumatic.  Cardiovascular:     Rate and Rhythm: Normal rate and regular rhythm.     Heart sounds: Normal heart sounds.  Pulmonary:     Effort: Pulmonary effort is normal.     Breath sounds: Normal breath sounds.  Abdominal:     General: Abdomen is flat. Bowel sounds are normal.     Palpations: Abdomen is soft.  Musculoskeletal:     Comments: Left leg: +homan sign. Slightly warm to touch. Circumference of left calf: 15cm Circumference of right calf: 16cm  No TTP in popliteal fossa. Walks with limp   Neurological:     General: No focal deficit present.  Mental Status: She is alert and oriented to person, place, and time.     Sensory: No sensory deficit.     Deep Tendon Reflexes: Reflexes normal.  Psychiatric:        Mood and Affect: Mood normal.        Behavior: Behavior normal.        Wells criteria score of 1  Assessment/plan: 1. Numbness and tingling of left lower extremity Rule out DVT although wells criteria is low. I do think she likely has calf strain, but with +homan sign sending for ultrasound now. Likely cause of intermittent tingling, but will check labs today as well. Stop ASA if starts eliquis.  - TSH - Vitamin B12 - CBC with Differential/Platelet -  Comprehensive metabolic panel - VAS [place] LOWER EXTREMITY VENOUS (DVT); Future  2. Pain of left calf See above. Discussed with her if +for DVT she is to start anticoagulation with eliquis. Starter pack sent in with written instructions. Would be unprovoked. Advised her to f/u with dr. Yong Channel. If negative, I discussed likely calf strain and will send to sports medicine.  - CK - VAS [place] LOWER EXTREMITY VENOUS (DVT); Future  **ultrasound negative for DVT. Sports med referral done. Recommended voltaren gel. Attempted to call patient. VM full.   This visit occurred during the SARS-CoV-2 public health emergency.  Safety protocols were in place, including screening questions prior to the visit, additional usage of staff PPE, and extensive cleaning of exam room while observing appropriate contact time as indicated for disinfecting solutions.     Return if symptoms worsen or fail to improve.  Orma Flaming, MD [place]   11/07/2019

## 2019-11-07 NOTE — Telephone Encounter (Signed)
Please let her know that she does NOT have a dVT. Do not pick up prescription I sent in. Get voltaren gel over the counter and put on her calf four times a day. I sent in referral to sports medicine as well.  Thanks for getting this done today!  D.r Rogers Blocker

## 2019-11-07 NOTE — Addendum Note (Signed)
Addended by: Orma Flaming on: 11/07/2019 04:20 PM   Modules accepted: Orders

## 2019-11-08 ENCOUNTER — Telehealth: Payer: Self-pay

## 2019-11-08 LAB — CK: Total CK: 98 U/L (ref 7–177)

## 2019-11-08 LAB — COMPREHENSIVE METABOLIC PANEL
ALT: 13 U/L (ref 0–35)
AST: 15 U/L (ref 0–37)
Albumin: 4.7 g/dL (ref 3.5–5.2)
Alkaline Phosphatase: 68 U/L (ref 39–117)
BUN: 20 mg/dL (ref 6–23)
CO2: 28 mEq/L (ref 19–32)
Calcium: 10.4 mg/dL (ref 8.4–10.5)
Chloride: 104 mEq/L (ref 96–112)
Creatinine, Ser: 0.69 mg/dL (ref 0.40–1.20)
GFR: 85.36 mL/min (ref >60.00)
Glucose, Bld: 86 mg/dL (ref 70–99)
Potassium: 4.1 mEq/L (ref 3.5–5.1)
Sodium: 142 mEq/L (ref 135–145)
Total Bilirubin: 0.6 mg/dL (ref 0.2–1.2)
Total Protein: 7.1 g/dL (ref 6.0–8.3)

## 2019-11-08 LAB — CBC WITH DIFFERENTIAL/PLATELET
Basophils Absolute: 0.1 10*3/uL (ref 0.0–0.1)
Basophils Relative: 0.7 % (ref 0.0–3.0)
Eosinophils Absolute: 0.2 10*3/uL (ref 0.0–0.7)
Eosinophils Relative: 2.6 % (ref 0.0–5.0)
HCT: 45 % (ref 36.0–46.0)
Hemoglobin: 15.1 g/dL — ABNORMAL HIGH (ref 12.0–15.0)
Lymphocytes Relative: 28.2 % (ref 12.0–46.0)
Lymphs Abs: 2.4 10*3/uL (ref 0.7–4.0)
MCHC: 33.5 g/dL (ref 30.0–36.0)
MCV: 94.8 fl (ref 78.0–100.0)
Monocytes Absolute: 0.7 10*3/uL (ref 0.1–1.0)
Monocytes Relative: 7.9 % (ref 3.0–12.0)
Neutro Abs: 5.2 10*3/uL (ref 1.4–7.7)
Neutrophils Relative %: 60.6 % (ref 43.0–77.0)
Platelets: 243 10*3/uL (ref 150.0–400.0)
RBC: 4.75 Mil/uL (ref 3.87–5.11)
RDW: 12.7 % (ref 11.5–15.5)
WBC: 8.6 10*3/uL (ref 4.0–10.5)

## 2019-11-08 LAB — TSH: TSH: 2.27 u[IU]/mL (ref 0.35–4.50)

## 2019-11-08 LAB — VITAMIN B12: Vitamin B-12: 865 pg/mL (ref 211–911)

## 2019-11-08 NOTE — Telephone Encounter (Signed)
Team-patient ended up not having DVT-this does not need to be filled  I am sending this to Dr. Eliberto Ivory as a thank you as well for taking excellent care of patient

## 2019-11-08 NOTE — Telephone Encounter (Signed)
Ann from [facility] called in regarding Apixaban Starter Pack (ELIQUIS STARTER PACK) 5 MG TBPK  They do not have Apixaban Starter Pack (ELIQUIS STARTER PACK) 5 MG TBPK in stock. Lelon Frohlich would like to know could they use tablets instead. Best contact number  [phone]

## 2019-11-08 NOTE — Telephone Encounter (Signed)
Called pt to give message below. Pt voiced understanding.  

## 2019-11-08 NOTE — Telephone Encounter (Signed)
OK for tablets? Please advise

## 2019-11-09 ENCOUNTER — Ambulatory Visit: Payer: BC Managed Care – PPO | Admitting: Family Medicine

## 2019-11-10 ENCOUNTER — Other Ambulatory Visit: Payer: Self-pay | Admitting: Family Medicine

## 2019-12-09 ENCOUNTER — Other Ambulatory Visit: Payer: Self-pay | Admitting: Family Medicine

## 2020-01-08 ENCOUNTER — Other Ambulatory Visit: Payer: Self-pay | Admitting: Family Medicine

## 2020-02-07 ENCOUNTER — Other Ambulatory Visit: Payer: Self-pay | Admitting: Family Medicine

## 2020-03-06 ENCOUNTER — Other Ambulatory Visit: Payer: Self-pay

## 2020-03-06 ENCOUNTER — Telehealth (INDEPENDENT_AMBULATORY_CARE_PROVIDER_SITE_OTHER): Payer: Medicare Other | Admitting: Family Medicine

## 2020-03-06 ENCOUNTER — Other Ambulatory Visit: Payer: Self-pay | Admitting: Family Medicine

## 2020-03-06 DIAGNOSIS — Z634 Disappearance and death of family member: Secondary | ICD-10-CM

## 2020-03-06 NOTE — Progress Notes (Signed)
Virtual Visit via Telephone Note  I connected with Rachel Martinique on 03/06/20 at 12:40 PM EDT by telephone and verified that I am speaking with the correct person using two identifiers.   I discussed the limitations, risks, security and privacy concerns of performing an evaluation and management service by telephone and the availability of in person appointments. I also discussed with the patient that there may be a patient responsible charge related to this service. The patient expressed understanding and agreed to proceed.  Location patient: home Location provider: work or home office Participants present for the call: patient, provider Patient did not have a visit in the prior 7 days to address this/these issue(s).   History of Present Illness:  Acute visit for sleep issues/behreavment: -lost her father a few weeks ago and mother is in SNF -reports this has thrown her for a loop - was ok when was planning the funeral, but now that things have quieted down is processing the loss -she tosses and turns all night -she has not been doing counseling, but is interested -had used benzos in the past remotely for these issues -no reported SI   Observations/Objective: Patient sounds cheerful and well on the phone. I do not appreciate any SOB. Speech and thought processing are grossly intact. Patient reported vitals:  Assessment and Plan:  Bereavement  -we discussed possible serious and likely etiologies, options for evaluation and workup, limitations of telemedicine visit vs in person visit, treatment, treatment risks and precautions. Pt prefers to treat via telemedicine empirically rather then risking or undertaking an in person visit at this moment. Discussed various treatment options for bereavement and sleep. Provided numbers for counseling with [place] and she seems interested in this. Discussed OTC options for sleep and handout in pt instructions for sleep.  Discussed medication and opted to avoid for now. Patient agrees to seek prompt in person care if worsening, new symptoms arise, or if is not improving with treatment.  Follow Up Instructions:   I did not refer this patient for an OV in the next 24 hours for this/these issue(s).  I discussed the assessment and treatment plan with the patient. The patient was provided an opportunity to ask questions and all were answered. The patient agreed with the plan and demonstrated an understanding of the instructions.   The patient was advised to call back or seek an in-person evaluation if the symptoms worsen or if the condition fails to improve as anticipated.  I provided 22 minutes of non-face-to-face time during this encounter.   Lucretia Kern, DO

## 2020-03-06 NOTE — Patient Instructions (Signed)
FOR IMPROVED SLEEP AND TO RESET YOUR SLEEP SCHEDULE:  []  Schedule sleep or bereavment counseling(cognitive behavioral therapy).   [facility] is a good option.Call for appointment: [phone] Or, can check with [facility] or [facility] - they both offer bereavement counseling.   []  Exercise 30 minutes daily. Avoid caffeine and alcohol - particularly in the evenings.  []  Go to bed and wake up at the same time everyday.  []  Keep bedroom cool, dark and quiet  []  Reserve bed for sleep - do not read, watch TV, look at phone or device, etc., in bed.  []  If you toss and turn for more then 10-15 minutes, get out of bed and list or journal thoughts or do quiet activity (not screen-time, no tv, phone, computer) then go back to bed. Repeat as needed. Try not to worry about when you will eventually fall asleep.  []  Some people find that a half dose of benadryl, melatonin, tylenol pm or unisom on a few nights per week is helpful initially for a few weeks.  []  Seek help for any depression or anxiety.  [] Prescription strength sleep medications should only be used in severe cases of insomnia if other measures fail and should be used sparingly.  I hope you are feeling better soon! Follow up with your doctor in 3-4 weeks or sooner if your symptoms worsen or new concerns arise.

## 2020-03-08 ENCOUNTER — Other Ambulatory Visit: Payer: Self-pay | Admitting: Family Medicine

## 2020-04-07 ENCOUNTER — Other Ambulatory Visit: Payer: Self-pay | Admitting: Family Medicine

## 2020-05-07 ENCOUNTER — Other Ambulatory Visit: Payer: Self-pay | Admitting: Family Medicine

## 2020-05-18 NOTE — Progress Notes (Addendum)
Phone: [phone]   Subjective:  Patient presents today for their Welcome to Medicare Exam    Preventive Screening-Counseling & Management  Vision screen: passed   Hearing Screening   125Hz  250Hz  500Hz  1000Hz  2000Hz  3000Hz  4000Hz  6000Hz  8000Hz   Right ear:           Left ear:           Comments: Left ear - 120 Pass Right ear- 120 Pass   Visual Acuity Screening   Right eye Left eye Both eyes  Without correction:     With correction: 20/20 20/25    Advanced directives: She does not have or living will set up at this time-I encouraged her to get a set up.Full code.   Modifiable Risk Factors/behavioral risk assessment/psychosocial risk assessment Regular exercise: walking most days of the week for 30-45 mins at good stride.  Diet: has cut out a lot of fried foods. Does treat herself occasionally. Water instead of sweet tea- occasional crystal light. Eating better overall. Increased fruits/veggies.  Phenomenal job on weight loss  Wt Readings from Last 3 Encounters:  05/21/20 215 lb (97.5 kg)  11/07/19 224 lb (101.6 kg)  05/11/19 240 lb 9.6 oz (109.1 kg)  Smoking Status: Never Smoker Second Hand Smoking status: No smokers in home Alcohol intake: 0 per week on average. Perhaps 1 beverage per year  Cardiac risk factors:  advanced age (older than 60 for men, 56 for women)  Known Hyperlipidemia - on statin  known Hypertension - treated No diabetes but monitoring prediabetes Lab Results  Component Value Date   HGBA1C 5.9 05/11/2019  Family History: dad in 78s with CAD/MI   Depression Screen/risk evaluation Risk factors: grieving as a risk factor plus treated depression PHQ9 of 0 despite recent losses Depression screen [facility] 2/9 05/21/2020 05/11/2019 05/06/2018 04/30/2017 09/26/2015  Decreased Interest 0 0 0 0 0  Down, Depressed, Hopeless 0 0 0 0 0  PHQ - 2 Score 0 0 0 0 0  Altered sleeping 0 0 1 0 -  Tired, decreased energy 0 1 1 0 -  Change in appetite 0 0 1 0 -  Feeling bad  or failure about yourself  0 0 0 0 -  Trouble concentrating 0 0 0 0 -  Moving slowly or fidgety/restless 0 0 0 0 -  Suicidal thoughts 0 0 0 0 -  PHQ-9 Score 0 1 3 0 -  Difficult doing work/chores Not difficult at all - Not difficult at all Not difficult at all -    Functional ability and level of safety Mobility assessment:  timed get up and go <12 seconds Activities of Daily Living- Independent in ADLs (toileting, bathing, dressing, transferring, eating) and in IADLs (shopping, housekeeping, managing own medications, and handling finances) Home Safety: Loose rugs (no), smoke detectors (up to date), small pets (has dog but no issues with falls), grab bars (not yet- may be years away- feels safe in shower), stairs (3 up front to get into home and no issues), life-alert system (would use cell phone) Hearing Difficulties: -patient declines Fall Risk: None . Team will update flow sheets Opioid use history:  no long term opioids use Self assessment of health status: "good"  Required Immunizations needed today:  Pneumovax 23 and high-dose flu shot recommended today- she will do these today and then covid 19 booster at least 2 weeks later Immunization History  Administered Date(s) Administered  . Influenza Split 04/15/2012  . Influenza Whole 08/16/2009  . Influenza,inj,Quad PF,6+ Mos  05/08/2014, 05/22/2015, 05/12/2016, 04/30/2017, 05/06/2018, 05/11/2019  . Influenza,inj,quad, With Preservative 04/11/2018  . PFIZER SARS-COV-2 Vaccination 10/15/2019, 11/05/2019  . Pneumococcal Conjugate-13 05/22/2015  . Td 01/02/2004  . Tdap 05/05/2013  . Zoster Recombinat (Shingrix) 10/23/2017   Health Maintenance  Topic Date Due  . Pneumonia vaccines (2 of 2 - PPSV23) 10/08/2019  . Flu Shot  03/11/2020  . HIV Screening  08/08/2098*  .  Hepatitis C: One time screening is recommended by Center for Disease Control  (CDC) for  adults born from [birth year] through 1964-11-05.   08/15/2098*  . Mammogram  01/22/2022  .  Tetanus Vaccine  05/06/2023  . Colon Cancer Screening  10/17/2026  . DEXA scan (bone density measurement)  Completed  . COVID-19 Vaccine  Completed  *Topic was postponed. The date shown is not the original due date.    Screening tests-  1. Colon cancer screening- October 16, 2016 at [facility] with plan for 10-year repeat 2. Lung Cancer screening- never smoker not a candidate 3. Skin cancer screening- does not see dermatology.  Denies worrisome, changing/new skin lesions.  Recommended regular sunscreen use  4. Cervical cancer screening- history of hysterectomy.  Still follows with Dr. Gertie Fey who decides on Pap smears and just had in June-technically past age based screening requirements 5. Breast cancer screening- breast exam with gynecology with Dr. Gertie Fey.  Mammogram 01/23/2020   The following were reviewed and entered/updated in epic: Past Medical History:  Diagnosis Date  . Allergy   . Anxiety   . Depression   . Hyperlipidemia   . Hypertension   . Obese    Patient Active Problem List   Diagnosis Date Noted  . Migraine headache with aura 10/03/2014    Priority: Medium  . Essential hypertension 07/03/2008    Priority: Medium  . Hyperlipidemia 01/08/2007    Priority: Medium  . Depression 01/08/2007    Priority: Medium  . History of total hysterectomy 05/06/2018    Priority: Low  . Hip bursitis 05/12/2016    Priority: Low  . 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 BENIGN POSITIONAL VERTIGO 05/22/2009    Priority: Low  . Obesity 01/24/2008    Priority: Low  . Allergic rhinitis 01/08/2007    Priority: Low   Past Surgical History:  Procedure Laterality Date  . ABDOMINAL HYSTERECTOMY    . carpal tunnel surgery bilaterally    . CHOLECYSTECTOMY    . left foot fascia release    . parotid gland surgery right side      Family History  Problem Relation Age of Onset  . Hypertension Mother   . Angina Mother        no stents  . Uterine cancer Mother   .  Alzheimer's disease Mother        in facility since 05-Nov-2014. died sept November 05, 2019  . Heart attack Father        mid 69s. died at 4  . Glaucoma Father    Medications- reviewed and updated Current Outpatient Medications  Medication Sig Dispense Refill  . aspirin 81 MG EC tablet Take 81 mg by mouth daily.      Marland Kitchen aspirin-acetaminophen-caffeine (EXCEDRIN MIGRAINE) 250-250-65 MG tablet Take by mouth every 6 (six) hours as needed for headache.    Marland Kitchen atenolol (TENORMIN) 25 MG tablet TAKE ONE TABLET BY MOUTH DAILY.  NEED APPOINTMENT. NO FURTHER REFILLS UNTIL SEEN 90 tablet 0  . azelastine (ASTELIN) 0.1 % nasal spray Place 2 sprays into both  nostrils 2 (two) times daily. Use in each nostril as directed 30 mL 12  . calcium carbonate (OS-CAL) 600 MG TABS Take 600 mg by mouth 2 (two) times daily with a meal.      . cetirizine (ZYRTEC) 10 MG tablet Take 10 mg by mouth daily.    . citalopram (CELEXA) 10 MG tablet TAKE ONE TABLET BY MOUTH DAILY 90 tablet 2  . fexofenadine (ALLEGRA) 60 MG tablet Take 60 mg by mouth 2 (two) times daily.    . fish oil-omega-3 fatty acids 1000 MG capsule Take 1 g by mouth daily.      . hydrochlorothiazide (HYDRODIURIL) 25 MG tablet Take 1 tablet (25 mg total) by mouth daily as needed (edema). 30 tablet 1  . Multiple Vitamin (MULTIVITAMIN) tablet Take 1 tablet by mouth daily.      . promethazine (PHENERGAN) 12.5 MG tablet Take 1 tablet (12.5 mg total) by mouth every 8 (eight) hours as needed for nausea or vomiting. 20 tablet 0  . simvastatin (ZOCOR) 20 MG tablet Take 1 tablet (20 mg total) by mouth daily at 6 PM. 90 tablet 3   No current facility-administered medications for this visit.    Allergies-reviewed and updated Allergies  Allergen Reactions  . Prednisone Other (See Comments)    Pt does not tolerate oral prednisone-causes severe migraine.   Injections only    Social History   Socioeconomic History  . Marital status: Married    Spouse name: Thomas Martinique  . Number of  children: 3  . Years of education: 12+  . Highest education level: Not on file  Occupational History  . Occupation: Solicitor: [employer]  Tobacco Use  . Smoking status: Never Smoker  . Smokeless tobacco: Never Used  Substance and Sexual Activity  . Alcohol use: No  . Drug use: No  . Sexual activity: Not on file  Other Topics Concern  . Not on file  Social History Narrative   Lives with her husband. Their adult children live nearby- 3 children. 3 grandkids.       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 Difficulty of Paying Living Expenses: Not on file  Food Insecurity: No Food Insecurity  . Worried About Charity fundraiser in the Last Year: Never true  . Ran Out of Food in the Last Year: Never true  Transportation Needs: No Transportation Needs  . Lack of Transportation (Medical): No  . Lack of Transportation (Non-Medical): No  Physical Activity: Sufficiently Active  . Days of Exercise per Week: 7 days  . Minutes of Exercise per Session: 40 min  Stress: No Stress Concern Present  . Feeling of Stress : Only a little  Social Connections: Socially Integrated  . Frequency of Communication with Friends and Family: More than three times a week  . Frequency of Social Gatherings with Friends and Family: Three times a week  . Attends Religious Services: More than 4 times per year  . Active Member of Clubs or Organizations: Yes  . Attends Archivist Meetings: More than 4 times per year  . Marital Status: Married   Objective  Objective:  BP 124/80   Pulse 64   Temp (!) 97.1 F (36.2 C) (Temporal)  Resp 18   Ht 5\' 7"  (1.702 m)   Wt 215 lb (97.5 kg)   LMP  (LMP Unknown)   SpO2 97%   BMI 33.67 kg/m  Gen: NAD, resting comfortably HEENT: Mucous  membranes are moist. Oropharynx normal Neck: no thyromegaly CV: RRR no murmurs rubs or gallops Lungs: CTAB no crackles, wheeze, rhonchi Abdomen: soft/nontender/nondistended/normal bowel sounds. No rebound or guarding.  Ext: stable edema 1+ Skin: warm, dry Neuro: grossly normal, moves all extremities, PERRLA    Assessment and Plan:   Welcome to Medicare exam completed-  1. Educated, counseled and referred based on above elements 2. Educated, counseled and referred as appropriate for preventative needs 3. Discussed and documented a written plan for preventiative services and screenings with personalized health advice- After Visit Summary was given to patient which included this plan  4. EKG offered [id number]- patient opts out  Status of chronic or acute concerns   #Social update-lost dad july 2021, lost mom sept 2021  #Hypertension-well controlled on atenolol 25 mg and hydrochlorothiazide 25 mg-continue current medications  #Hyperlipidemia-LDL above 100/mild poor control.  Hesitant to increase statin with prediabetes risk. update today hopefully improved with weight loss  #Prediabetes/hyperglycemia-A1c elevated last year, have discussed healthy eating/regular exercise. Update a1c today hopefully improved with weight loss  Lab Results  Component Value Date   HGBA1C 5.9 05/11/2019   #Hip Bursitis-is working with Dr. Lyla Glassing last year and had required injections-today reports much better  #Swelling/numbness-no obvious cause on labs last year including normal B12, TSH, A1c.  We have tried low-salt diet in the past to help with edema.  We have also recommended compression stockings-mild improvement with this. Slightly improved overall- still with swelling as on feet a lot.   #Migraines with aura-very sparing Vicodin in the past- very sparing- but primarily uses Excedrin.  Also uses phentermine with headaches  #Seasonal allergies-does well on combo of Allegra (switched from  claritin)-also uses atelin and flonase- worse int he fall and gets ear congestoin.  Occasionally gets wheezing upper airway-no shortness of breath though  #had flashing floaters- saw optho and eventually had migraine- may have been an atypical for her aura. Thankfully retina was ok.   Recommended follow up: 1 year annual wellness visit   Lab/Order associations:   ICD-10-CM   1. Preventative health care  Z00.00   2. Hyperlipidemia, unspecified hyperlipidemia type  E78.5 CBC With Differential/Platelet    COMPLETE METABOLIC PANEL WITH GFR    Lipid Panel (Refl)  3. Essential hypertension  I10   4. Migraine with aura and without status migrainosus, not intractable  G43.109   5. Recurrent major depressive disorder, in full remission ([place])  F33.42   6. Hyperglycemia  R73.9 Hemoglobin A1c    No orders of the defined types were placed in this encounter.   Return precautions advised. Garret Reddish, MD

## 2020-05-18 NOTE — Patient Instructions (Addendum)
Health Maintenance Due  Topic Date Due  . PNA vac Low Risk Adult (2 of 2 - PPSV23)-can do at later date- focus on flu and covid booster first 10/08/2019  . INFLUENZA VACCINE  Pneumovax 23 and high-dose flu shot recommended today- she will do these today and then covid 19 booster at least 2 weeks later 03/11/2020    Ms. Martinique , Thank you for taking time to come for your Medicare Wellness Visit. I appreciate your ongoing commitment to your health goals. Please review the following plan we discussed and let me know if I can assist you in the future.   These are the goals we discussed: 1. Keep up the great job on exercise and healthy eating!   This is a list of the screening recommended for you and due dates:  Health Maintenance  Topic Date Due  . Pneumonia vaccines (2 of 2 - PPSV23) 10/08/2019  . Flu Shot  03/11/2020  . HIV Screening  08/08/2098*  .  Hepatitis C: One time screening is recommended by Center for Disease Control  (CDC) for  adults born from [birth year] through 1966.   08/15/2098*  . Mammogram  01/22/2022  . Tetanus Vaccine  05/06/2023  . Colon Cancer Screening  10/17/2026  . DEXA scan (bone density measurement)  Completed  . COVID-19 Vaccine  Completed  *Topic was postponed. The date shown is not the original due date.   Please stop by lab before you go If you have mychart- we will send your results within 3 business days of [place] receiving them.  If you do not have mychart- we will call you about results within 5 business days of [place] receiving them.  *please note we are currently using [facility] which has a longer processing time than [facility] typically so labs may not come back as quickly as in the past *please also note that you will see labs on mychart as soon as they post. I will later go in and write notes on them- will say "notes from Dr. Yong Channel"

## 2020-05-21 ENCOUNTER — Other Ambulatory Visit: Payer: Self-pay

## 2020-05-21 ENCOUNTER — Encounter: Payer: Self-pay | Admitting: Family Medicine

## 2020-05-21 ENCOUNTER — Ambulatory Visit (INDEPENDENT_AMBULATORY_CARE_PROVIDER_SITE_OTHER): Payer: Medicare Other | Admitting: Family Medicine

## 2020-05-21 VITALS — BP 124/80 | HR 64 | Temp 97.1°F | Resp 18 | Ht 67.0 in | Wt 215.0 lb

## 2020-05-21 DIAGNOSIS — R739 Hyperglycemia, unspecified: Secondary | ICD-10-CM

## 2020-05-21 DIAGNOSIS — E785 Hyperlipidemia, unspecified: Secondary | ICD-10-CM | POA: Diagnosis not present

## 2020-05-21 DIAGNOSIS — Z0000 Encounter for general adult medical examination without abnormal findings: Secondary | ICD-10-CM | POA: Diagnosis not present

## 2020-05-21 DIAGNOSIS — G43109 Migraine with aura, not intractable, without status migrainosus: Secondary | ICD-10-CM | POA: Diagnosis not present

## 2020-05-21 DIAGNOSIS — Z23 Encounter for immunization: Secondary | ICD-10-CM | POA: Diagnosis not present

## 2020-05-21 DIAGNOSIS — I10 Essential (primary) hypertension: Secondary | ICD-10-CM

## 2020-05-21 DIAGNOSIS — F3342 Major depressive disorder, recurrent, in full remission: Secondary | ICD-10-CM

## 2020-05-21 MED ORDER — SIMVASTATIN 20 MG PO TABS: 20.0000 mg | ORAL_TABLET | Freq: Every day | ORAL | 3 refills | Status: DC

## 2020-05-21 NOTE — Addendum Note (Signed)
Addended by: Thomes Cake on: 05/21/2020 10:36 AM   Modules accepted: Orders

## 2020-05-22 LAB — CBC WITH DIFFERENTIAL/PLATELET
Absolute Monocytes: 588 cells/uL (ref 200–950)
Basophils Absolute: 59 cells/uL (ref 0–200)
Basophils Relative: 0.7 %
Eosinophils Absolute: 252 cells/uL (ref 15–500)
Eosinophils Relative: 3 %
HCT: 44.4 % (ref 35.0–45.0)
Hemoglobin: 15.1 g/dL (ref 11.7–15.5)
Lymphs Abs: 2629 cells/uL (ref 850–3900)
MCH: 31.5 pg (ref 27.0–33.0)
MCHC: 34 g/dL (ref 32.0–36.0)
MCV: 92.7 fL (ref 80.0–100.0)
MPV: 11.5 fL (ref 7.5–12.5)
Monocytes Relative: 7 %
Neutro Abs: 4872 cells/uL (ref 1500–7800)
Neutrophils Relative %: 58 %
Platelets: 261 10*3/uL (ref 140–400)
RBC: 4.79 10*6/uL (ref 3.80–5.10)
RDW: 12.1 % (ref 11.0–15.0)
Total Lymphocyte: 31.3 %
WBC: 8.4 10*3/uL (ref 3.8–10.8)

## 2020-05-22 LAB — COMPLETE METABOLIC PANEL WITH GFR
AG Ratio: 2 (calc) (ref 1.0–2.5)
ALT: 14 U/L (ref 6–29)
AST: 16 U/L (ref 10–35)
Albumin: 4.5 g/dL (ref 3.6–5.1)
Alkaline phosphatase (APISO): 66 U/L (ref 37–153)
BUN: 21 mg/dL (ref 7–25)
CO2: 31 mmol/L (ref 20–32)
Calcium: 9.9 mg/dL (ref 8.6–10.4)
Chloride: 103 mmol/L (ref 98–110)
Creat: 0.62 mg/dL (ref 0.50–0.99)
GFR, Est African American: 110 mL/min/{1.73_m2} (ref >=60)
GFR, Est Non African American: 95 mL/min/{1.73_m2} (ref >=60)
Globulin: 2.2 g/dL (calc) (ref 1.9–3.7)
Glucose, Bld: 94 mg/dL (ref 65–99)
Potassium: 4.6 mmol/L (ref 3.5–5.3)
Sodium: 141 mmol/L (ref 135–146)
Total Bilirubin: 0.5 mg/dL (ref 0.2–1.2)
Total Protein: 6.7 g/dL (ref 6.1–8.1)

## 2020-05-22 LAB — HEMOGLOBIN A1C
Hgb A1c MFr Bld: 5.6 % of total Hgb (ref <5.7)
Mean Plasma Glucose: 114 (calc)
eAG (mmol/L): 6.3 (calc)

## 2020-05-22 LAB — LIPID PANEL (REFL)
Cholesterol: 180 mg/dL (ref <200)
HDL: 43 mg/dL — ABNORMAL LOW (ref >=50)
LDL Cholesterol (Calc): 112 mg/dL (calc) — ABNORMAL HIGH
Non-HDL Cholesterol (Calc): 137 mg/dL (calc) — ABNORMAL HIGH (ref <130)
Total CHOL/HDL Ratio: 4.2 (calc) (ref <5.0)
Triglycerides: 136 mg/dL (ref <150)

## 2020-05-29 ENCOUNTER — Other Ambulatory Visit: Payer: Self-pay

## 2020-05-29 MED ORDER — SIMVASTATIN 40 MG PO TABS: 40.0000 mg | ORAL_TABLET | Freq: Every day | ORAL | 3 refills | Status: DC

## 2020-07-09 ENCOUNTER — Other Ambulatory Visit: Payer: Self-pay | Admitting: Family Medicine

## 2020-07-09 DIAGNOSIS — F3342 Major depressive disorder, recurrent, in full remission: Secondary | ICD-10-CM

## 2020-08-09 ENCOUNTER — Other Ambulatory Visit: Payer: Self-pay | Admitting: Family Medicine

## 2020-08-18 DIAGNOSIS — I10 Essential (primary) hypertension: Secondary | ICD-10-CM | POA: Diagnosis not present

## 2020-08-18 DIAGNOSIS — R0602 Shortness of breath: Secondary | ICD-10-CM | POA: Diagnosis not present

## 2020-08-18 DIAGNOSIS — R635 Abnormal weight gain: Secondary | ICD-10-CM | POA: Diagnosis not present

## 2020-08-18 DIAGNOSIS — G4700 Insomnia, unspecified: Secondary | ICD-10-CM | POA: Diagnosis not present

## 2020-09-27 ENCOUNTER — Telehealth (INDEPENDENT_AMBULATORY_CARE_PROVIDER_SITE_OTHER): Payer: Medicare HMO | Admitting: Family Medicine

## 2020-09-27 VITALS — Temp 101.0°F | Ht 67.0 in | Wt 212.0 lb

## 2020-09-27 DIAGNOSIS — R059 Cough, unspecified: Secondary | ICD-10-CM

## 2020-09-27 DIAGNOSIS — Z20822 Contact with and (suspected) exposure to covid-19: Secondary | ICD-10-CM

## 2020-09-27 DIAGNOSIS — R509 Fever, unspecified: Secondary | ICD-10-CM

## 2020-09-27 NOTE — Progress Notes (Signed)
   Rachel Gates is a 65 y.o. female who presents today for a telephone visit.  Assessment/Plan:  COVID Patient with known sick contact and several symptoms of Covid.  She is at high risk of Covid complication due to age and comorbidities.  She is interested in possible antiviral treatment.  She does not want infusion.  Flu is also possibility however much less likely given known sick contacts.  Will place referral to Covid treatment center.  Encourage good hydration.  Discussed reasons to return to care and seek emergent care.    Subjective:  HPI:  Patient with COVID symptoms for the past day.  She was of her granddaughter about 3 to 4 days ago.  Her granddaughter started developing symptoms and tested positive for Covid earlier this week.  Patient symptoms include fever to 101 F, cough, congestion, scratchy throat.  Has had significant mental fatigue and malaise as well.  No reported chest pain or shortness of breath.       Objective/Observations   NAD  Telephone Visit   I connected with Neema Gates on 09/27/20 at  4:00 PM EST via telephone and verified that I am speaking with the correct person using two identifiers. I discussed the limitations of evaluation and management by telemedicine and the availability of in person appointments. The patient expressed understanding and agreed to proceed.   Patient location: Home Provider location: [place] participating in the virtual visit: Myself and Patient  A total of 11 minutes were spent on medical discussion.      Algis Greenhouse. Jerline Pain, MD 09/27/2020 4:00 PM

## 2020-09-28 ENCOUNTER — Telehealth (HOSPITAL_COMMUNITY): Payer: Self-pay

## 2020-09-28 ENCOUNTER — Other Ambulatory Visit: Payer: Self-pay | Admitting: Family

## 2020-09-28 ENCOUNTER — Telehealth: Payer: Self-pay

## 2020-09-28 DIAGNOSIS — U071 COVID-19: Secondary | ICD-10-CM

## 2020-09-28 MED ORDER — NIRMATRELVIR/RITONAVIR (PAXLOVID)TABLET: ORAL_TABLET | ORAL | 0 refills | Status: DC

## 2020-09-28 MED FILL — PAXLOVID 20 X 150 MG & 10 X: 20 X 150 MG | 5 days supply | Qty: 30 | Fill #0

## 2020-09-28 NOTE — Telephone Encounter (Signed)
Follow up from RN pre-screen. Ms. Rachel Gates had symptoms started on 2/16 and continues to experience cough, sore throat, fever and chills. She is vaccinated and boosted. Qualifying risk factors include hyperlipidemia, age, and hypertension. Does not have a positive test - but likely too early.   Discussed the risks, benefits, and potential financial costs associated with treatment with Sotrovimab and Paxlovid and she would like to continue with treatment with Paxlovid.   [name], NP 09/28/2020 11:02 AM

## 2020-09-28 NOTE — Telephone Encounter (Signed)
Called to discuss with patient about COVID-19 symptoms and the use of one of the available treatments for those with mild to moderate Covid symptoms and at a high risk of hospitalization.  Pt appears to qualify for outpatient treatment due to co-morbid conditions and/or a member of an at-risk group in accordance with the FDA Emergency Use Authorization.    Symptom onset: 09/26/20 Cough, sore throat,fever, chills. Vaccinated: Yes Booster? Yes Immunocompromised? No Qualifiers: HTN  Pt. Would like to speak with APP.  Rachel Gates

## 2020-09-28 NOTE — Progress Notes (Signed)
Outpatient Oral COVID Treatment Note  I connected with Rachel Gates on 09/28/2020/11:02 AM by telephone and verified that I am speaking with the correct person using two identifiers.  I discussed the limitations, risks, security, and privacy concerns of performing an evaluation and management service by telephone and the availability of in person appointments. I also discussed with the patient that there may be a patient responsible charge related to this service. The patient expressed understanding and agreed to proceed.  Patient location: Home Provider location: [place]  Diagnosis: COVID-19 infection  Purpose of visit: Discussion of potential use of Molnupiravir or Paxlovid, a new treatment for mild to moderate COVID-19 viral infection in non-hospitalized patients.   Subjective: Patient is a 65 y.o. female who has been diagnosed with COVID 19 viral infection.  Their symptoms began on 09/26/20 with cough, sore throat, fever and congestion.    Past Medical History:  Diagnosis Date  . Allergy   . Anxiety   . Depression   . Hyperlipidemia   . Hypertension   . Obese     Allergies  Allergen Reactions  . Prednisone Other (See Comments)    Pt does not tolerate oral prednisone-causes severe migraine.   Injections only     Current Outpatient Medications:  .  aspirin 81 MG EC tablet, Take 81 mg by mouth daily., Disp: , Rfl:  .  aspirin-acetaminophen-caffeine (EXCEDRIN MIGRAINE) 250-250-65 MG tablet, Take by mouth every 6 (six) hours as needed for headache., Disp: , Rfl:  .  atenolol (TENORMIN) 25 MG tablet, TAKE ONE TABLET BY MOUTH DAILY; **MUST CALL MD FOR APPOINTMENT FOR FURTHER REFILLS, Disp: 90 tablet, Rfl: 0 .  azelastine (ASTELIN) 0.1 % nasal spray, Place 2 sprays into both nostrils 2 (two) times daily. Use in each nostril as directed, Disp: 30 mL, Rfl: 12 .  calcium carbonate (OS-CAL) 600 MG TABS, Take 600 mg by mouth 2 (two) times daily with a meal., Disp: , Rfl:  .  citalopram  (CELEXA) 10 MG tablet, TAKE ONE TABLET BY MOUTH DAILY, Disp: 90 tablet, Rfl: 2 .  fexofenadine (ALLEGRA) 60 MG tablet, Take 60 mg by mouth 2 (two) times daily., Disp: , Rfl:  .  fish oil-omega-3 fatty acids 1000 MG capsule, Take 1 g by mouth daily., Disp: , Rfl:  .  Multiple Vitamin (MULTIVITAMIN) tablet, Take 1 tablet by mouth daily., Disp: , Rfl:  .  simvastatin (ZOCOR) 40 MG tablet, Take 1 tablet (40 mg total) by mouth at bedtime., Disp: 90 tablet, Rfl: 3  Objective: Patient sounds okay.  They are in no apparent distress.  Breathing is non labored.  Mood and behavior are normal.  Laboratory Data:  No results found for this or any previous visit (from the past 2160 hour(s)).   Assessment: 65 y.o. female with mild/moderate COVID 19 viral infection diagnosed on 2/16 at high risk for progression to severe COVID 19.  Plan:  This patient is a 65 y.o. female that meets the following criteria for Emergency Use Authorization of: Paxlovid 1. Age >12 yr AND > 40 kg 2. SARS-COV-2 positive test 3. Symptom onset < 5 days 4. Mild-to-moderate COVID disease with high risk for severe progression to hospitalization or death  I have spoken and communicated the following to the patient or parent/caregiver regarding: 1. Paxlovid is an unapproved drug that is authorized for use under an Emergency Use Authorization.  2. There are no adequate, approved, available products for the treatment of COVID-19 in adults who have mild-to-moderate COVID-19  and are at high risk for progressing to severe COVID-19, including hospitalization or death. 3. Other therapeutics are currently authorized. For additional information on all products authorized for treatment or prevention of COVID-19, please see [employer].  4. There are benefits and risks of taking this treatment as outlined in the "Fact Sheet for Patients and  Caregivers."  5. "Fact Sheet for Patients and Caregivers" was reviewed with patient. A hard copy will be provided to patient from pharmacy prior to the patient receiving treatment. 6. Patients should continue to self-isolate and use infection control measures (e.g., wear mask, isolate, social distance, avoid sharing personal items, clean and disinfect "high touch" surfaces, and frequent handwashing) according to CDC guidelines.  7. The patient or parent/caregiver has the option to accept or refuse treatment. 8. Patient medication history was reviewed for potential drug interactions:Interaction with home meds: Simvastatin 9. Patient's GFR was calculated to be 96, and they were therefore prescribed Normal dose (GFR>60) - nirmatrelvir 150mg  tab (2 tablet) by mouth twice daily AND ritonavir 100mg  tab (1 tablet) by mouth twice daily   After reviewing above information with the patient, the patient agrees to receive Paxlovid.  Follow up instructions:    . Take prescription BID x 5 days as directed . Reach out to pharmacist for counseling on medication if desired . For concerns regarding further COVID symptoms please follow up with your PCP or urgent care . For urgent or life-threatening issues, seek care at your local emergency department  The patient was provided an opportunity to ask questions, and all were answered. The patient agreed with the plan and demonstrated an understanding of the instructions.   Script sent to [facility] and opted to pick up RX.  The patient was advised to call their PCP or seek an in-person evaluation if the symptoms worsen or if the condition fails to improve as anticipated.   I provided 13 minutes of non face-to-face telephone visit time during this encounter, and > 50% was spent counseling as documented under my assessment & plan.  [name], NP 09/28/2020 11:06 AM

## 2020-09-28 NOTE — Telephone Encounter (Signed)
Patient was prescribed oral covid treatment Paxlovid and treatment note was reviewed. Medication has been received by {[facility]] and reviewed for appropriateness.  Drug Interactions or Dosage Adjustments Noted: hold zocor  Delivery Method: pick up  Patient contacted for counseling on 09/28/20 and verbalized understanding.   Delivery or Pick-Up Date: 09/28/20   Alinda Dooms 09/28/2020, 1:25 PM [facility] Pharmacist Phone# [phone]

## 2020-09-28 NOTE — Addendum Note (Signed)
Addended by: Mauricio Po D on: 09/28/2020 11:06 AM   Modules accepted: Orders

## 2020-10-02 ENCOUNTER — Telehealth: Payer: Self-pay

## 2020-10-02 NOTE — Telephone Encounter (Signed)
See below

## 2020-10-02 NOTE — Telephone Encounter (Signed)
I would prefer <140/90 but short term those #s do not look too bad. If her heart rate is above 75 or so could have her try atenolol twice a day until off of medicine for covid

## 2020-10-02 NOTE — Telephone Encounter (Signed)
Patient is currently taking medication (plavolid) for covid the 5 day dose and patient is having elevated BP 9:30 AM - 154-90  4:00 PM - 137/94 Patient would like to know if she should be concerned regarding this because it is a side effect of the medication  [phone] HOME

## 2020-10-03 NOTE — Telephone Encounter (Signed)
Called and lm on pt vm tcb and mychart message sent as well.

## 2020-10-19 ENCOUNTER — Other Ambulatory Visit: Payer: Self-pay

## 2020-10-19 MED ORDER — SIMVASTATIN 40 MG PO TABS: 40.0000 mg | ORAL_TABLET | Freq: Every day | ORAL | 3 refills | Status: DC

## 2020-10-22 ENCOUNTER — Encounter: Payer: Self-pay | Admitting: Physician Assistant

## 2020-10-22 ENCOUNTER — Ambulatory Visit (INDEPENDENT_AMBULATORY_CARE_PROVIDER_SITE_OTHER): Payer: Medicare HMO | Admitting: Physician Assistant

## 2020-10-22 ENCOUNTER — Other Ambulatory Visit: Payer: Self-pay

## 2020-10-22 ENCOUNTER — Telehealth: Payer: Self-pay

## 2020-10-22 VITALS — BP 149/79 | HR 57 | Temp 97.6°F | Ht 67.0 in | Wt 217.0 lb

## 2020-10-22 DIAGNOSIS — U099 Post covid-19 condition, unspecified: Secondary | ICD-10-CM

## 2020-10-22 DIAGNOSIS — F419 Anxiety disorder, unspecified: Secondary | ICD-10-CM | POA: Diagnosis not present

## 2020-10-22 DIAGNOSIS — G43109 Migraine with aura, not intractable, without status migrainosus: Secondary | ICD-10-CM

## 2020-10-22 DIAGNOSIS — F3342 Major depressive disorder, recurrent, in full remission: Secondary | ICD-10-CM

## 2020-10-22 MED ORDER — CITALOPRAM HYDROBROMIDE 10 MG PO TABS: 10.0000 mg | ORAL_TABLET | Freq: Every day | ORAL | 2 refills | Status: DC

## 2020-10-22 MED ORDER — KETOROLAC TROMETHAMINE 60 MG/2ML IM SOLN
30.0000 mg | Freq: Once | INTRAMUSCULAR | Status: AC
  Administered 2020-10-22: 30 mg via INTRAMUSCULAR

## 2020-10-22 MED ORDER — CITALOPRAM HYDROBROMIDE 20 MG PO TABS: 20.0000 mg | ORAL_TABLET | Freq: Every day | ORAL | 0 refills | Status: DC

## 2020-10-22 MED ORDER — ATENOLOL 25 MG PO TABS: ORAL_TABLET | ORAL | 3 refills | Status: DC

## 2020-10-22 NOTE — Patient Instructions (Signed)
Good to see you today. I think you are manifesting some symptoms of long-COVID. Please increase the Celexa to 20 mg daily. Send [place] a MyChart message in about 2 weeks letting us know if this is helping or not.  Toradol injection for you migraine today.  Continue to try to rest as much as possible. Walk and be outside when able. Stay plenty hydrated. Try meditation or yoga. Be patient and give yourself grace.

## 2020-10-22 NOTE — Telephone Encounter (Signed)
Rx sent 

## 2020-10-22 NOTE — Progress Notes (Signed)
Established Patient Office Visit  Subjective:  Patient ID: Rachel Gates, female    DOB: 08/06/1956  Age: 65 y.o. MRN: [id number]  HPI Rachel Gates presents for increasing fatigue, anxiety, and difficulty sleeping.  Patient states that she has struggled with these symptoms previously, but never to this extent. She was treated for COVID-19 with Paxlovid on 09/28/20 and since this illness it seems like her symptoms have been exacerbated. Stayed tired during COVID illness, but says now she can barely sleep. She has tried Melatonin and Unisom with only minimal relief. Tosses and turns all night.  She does take Celexa 10 mg daily and has been on this since her daughter was just out of high school.  She has never had to increase the dose or change medications.  She does not have any suicidal ideations.  She has been having more headaches/migraines and has been needing to take Excedrin.  Her current migraine today has been there about 2 days without any relief.  She denies any worst headaches of her life.  There is no nausea or vomiting, photophobia, weakness, slurred speech.  She does not have any shortness of breath or chest pain.  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   Past Medical History:  Diagnosis Date  . Allergy   . Anxiety   . Depression   . Hyperlipidemia   . Hypertension   . Obese     Past Surgical History:  Procedure Laterality Date  . ABDOMINAL HYSTERECTOMY    . carpal tunnel surgery bilaterally    . CHOLECYSTECTOMY    . left foot fascia release    . parotid gland surgery right side      Family History  Problem Relation Age of Onset  . Hypertension Mother   . Angina Mother        no stents  . Uterine cancer Mother   . Alzheimer's disease Mother        in facility since 2014/10/31. died sept 10-31-19  . Heart attack Father        mid  63s. died at 50  . Glaucoma Father     Social History   Socioeconomic History  . Marital status: Married    Spouse name: Rachel Gates  . Number of children: 3  . Years of education: 12+  . Highest education level: Not on file  Occupational History  . Occupation: Solicitor: [employer]  Tobacco Use  . Smoking status: Never Smoker  . Smokeless tobacco: Never Used  Substance and Sexual Activity  . Alcohol use: No  . Drug use: No  . Sexual activity: Not on file  Other Topics Concern  . Not on file  Social History Narrative   Lives with her husband. Their adult children live nearby- 3 children. 3 grandkids.       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 Worried About Charity fundraiser in the Last Year: Never true  . Ran Out of Food in the Last Year: Never true  Transportation Needs: No Transportation Needs  . Lack of Transportation (Medical): No  . Lack of Transportation (Non-Medical): No  Physical Activity: Sufficiently Active  . Days of Exercise per Week: 7 days  . Minutes of Exercise per Session: 40 min  Stress: No Stress Concern Present  . Feeling of Stress : Only a little  Social Connections: Socially Integrated  . Frequency of Communication with Friends and Family: More than three times a week  . Frequency of Social Gatherings with Friends and Family: Three times a week  . Attends Religious Services: More than 4 times per year  . Active Member of Clubs or Organizations: Yes  . Attends Archivist Meetings: More than 4 times per year  . Marital Status: Married  Human resources officer Violence: Not At Risk  . Fear of Current or Ex-Partner: No  . Emotionally Abused: No  . Physically Abused: No  .  Sexually Abused: No    Outpatient Medications Prior to Visit  Medication Sig Dispense Refill  . aspirin 81 MG EC tablet Take 81 mg by mouth daily.    Marland Kitchen aspirin-acetaminophen-caffeine (EXCEDRIN MIGRAINE) 250-250-65 MG tablet Take by mouth every 6 (six) hours as needed for headache.    Marland Kitchen atenolol (TENORMIN) 25 MG tablet TAKE ONE TABLET BY MOUTH DAILY; **MUST CALL MD FOR APPOINTMENT FOR FURTHER REFILLS 90 tablet 0  . azelastine (ASTELIN) 0.1 % nasal spray Place 2 sprays into both nostrils 2 (two) times daily. Use in each nostril as directed 30 mL 12  . calcium carbonate (OS-CAL) 600 MG TABS Take 600 mg by mouth 2 (two) times daily with a meal.    . citalopram (CELEXA) 10 MG tablet TAKE ONE TABLET BY MOUTH DAILY 90 tablet 2  . fexofenadine (ALLEGRA) 60 MG tablet Take 60 mg by mouth 2 (two) times daily.    . fish oil-omega-3 fatty acids 1000 MG capsule Take 1 g by mouth daily.    . Multiple Vitamin (MULTIVITAMIN) tablet Take 1 tablet by mouth daily.    . simvastatin (ZOCOR) 40 MG tablet Take 1 tablet (40 mg total) by mouth at bedtime. 90 tablet 3  . vitamin B-12 (CYANOCOBALAMIN) 1000 MCG tablet Take 2,000 mcg by mouth daily.    . nirmatrelvir/ritonavir EUA (PAXLOVID) TABS Take by mouth nirmatrelvir (150 mg) 2 tablet(s) twice daily for 5 days and ritonavir (100 mg) one tablet twice daily for 5 days. 30 tablet 0   No facility-administered medications prior to visit.    Allergies  Allergen Reactions  . Prednisone Other (See Comments)    Pt does not tolerate oral prednisone-causes severe migraine.   Injections only    ROS Review of Systems REFER TO HPI FOR PERTINENT POSITIVES AND NEGATIVES    Objective:    Physical Exam Vitals and nursing note reviewed.  Constitutional:      General: She is in acute distress (TEARFUL).  HENT:     Head: Normocephalic and atraumatic.  Cardiovascular:     Rate and Rhythm: Normal rate and regular rhythm.     Pulses: Normal pulses.     Heart sounds:  Normal heart sounds.  Pulmonary:     Effort: Pulmonary effort is normal.     Breath sounds: Normal breath sounds.  Psychiatric:        Mood and Affect: Mood is depressed. Affect is tearful.        Thought Content: Thought content is not paranoid or delusional. Thought content does not include suicidal ideation.  BP (!) 149/79   Pulse (!) 57   Temp 97.6 F (36.4 C)   Ht 5\' 7"  (1.702 m)   Wt 217 lb (98.4 kg)   LMP  (LMP Unknown)   SpO2 99%   BMI 33.99 kg/m  Wt Readings from Last 3 Encounters:  10/22/20 217 lb (98.4 kg)  09/27/20 212 lb (96.2 kg)  05/21/20 215 lb (97.5 kg)     Health Maintenance Due  Topic Date Due  . COVID-19 Vaccine (3 - Booster for Pfizer series) 05/07/2020    There are no preventive care reminders to display for this patient.  Lab Results  Component Value Date   TSH 2.27 11/07/2019   Lab Results  Component Value Date   WBC 8.4 05/21/2020   HGB 15.1 05/21/2020   HCT 44.4 05/21/2020   MCV 92.7 05/21/2020   PLT 261 05/21/2020   Lab Results  Component Value Date   NA 141 05/21/2020   K 4.6 05/21/2020   CO2 31 05/21/2020   GLUCOSE 94 05/21/2020   BUN 21 05/21/2020   CREATININE 0.62 05/21/2020   BILITOT 0.5 05/21/2020   ALKPHOS 68 11/07/2019   AST 16 05/21/2020   ALT 14 05/21/2020   PROT 6.7 05/21/2020   ALBUMIN 4.7 11/07/2019   CALCIUM 9.9 05/21/2020   ANIONGAP 11 05/03/2018   GFR 85.36 11/07/2019   Lab Results  Component Value Date   CHOL 180 05/21/2020   Lab Results  Component Value Date   HDL 43 (L) 05/21/2020   Lab Results  Component Value Date   LDLCALC 112 (H) 05/21/2020   Lab Results  Component Value Date   TRIG 136 05/21/2020   Lab Results  Component Value Date   CHOLHDL 4.2 05/21/2020   Lab Results  Component Value Date   HGBA1C 5.6 05/21/2020      Assessment & Plan:   Problem List Items Addressed This Visit      Cardiovascular and Mediastinum   Migraine headache with aura   Relevant Medications    citalopram (CELEXA) 20 MG tablet    Other Visit Diagnoses    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 citalopram (CELEXA) 20 MG tablet    Sig: Take 1 tablet (20 mg total) by mouth daily.    Dispense:  30 tablet    Refill:  0  . ketorolac (TORADOL) injection 30 mg    Follow-up: No follow-ups on file.   1. 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  I am going to increase her Celexa to 20 milligram daily.  Patient is going to send a MyChart message in about 2 weeks to let myself or Dr. Yong Channel know how she is doing.  Encouraged her to try to walk and be outside.  She needs to drink plenty of water and try to find ways to reduce stress right now.  She will call sooner if she has any concerns.  2. Migraine with aura and without status migrainosus, not intractable This is a typical migraine for her.  There are no red flag symptoms.  No focal neurologic findings.  My CMA gave Toradol 30 mg IM in office today for acute relief.  Patient will let us know how she is doing with this as well.  This  note was prepared with assistance of Systems analyst. Occasional wrong-word or sound-a-like substitutions may have occurred due to the inherent limitations of voice recognition software.  This visit occurred during the SARS-CoV-2 public health emergency.  Safety protocols were in place, including screening questions prior to the visit, additional usage of staff PPE, and extensive cleaning of exam room while observing appropriate contact time as indicated for disinfecting solutions.     Willie Loy M Victoriana Aziz, PA-C

## 2020-10-22 NOTE — Telephone Encounter (Signed)
..   LAST APPOINTMENT DATE: 10/22/2020   NEXT APPOINTMENT DATE:@Visit  date not found  MEDICATION:citalopram (CELEXA) 10 MG tablet atenolol (TENORMIN) 25 MG tablet     PHARMACY: [facility]   L

## 2020-11-20 ENCOUNTER — Other Ambulatory Visit: Payer: Self-pay | Admitting: Physician Assistant

## 2020-11-20 MED ORDER — CITALOPRAM HYDROBROMIDE 20 MG PO TABS: 20.0000 mg | ORAL_TABLET | Freq: Every day | ORAL | 2 refills | Status: DC

## 2020-12-18 DIAGNOSIS — G4700 Insomnia, unspecified: Secondary | ICD-10-CM | POA: Diagnosis not present

## 2020-12-18 DIAGNOSIS — I10 Essential (primary) hypertension: Secondary | ICD-10-CM | POA: Diagnosis not present

## 2020-12-18 DIAGNOSIS — R635 Abnormal weight gain: Secondary | ICD-10-CM | POA: Diagnosis not present

## 2020-12-18 DIAGNOSIS — R0602 Shortness of breath: Secondary | ICD-10-CM | POA: Diagnosis not present

## 2021-01-25 DIAGNOSIS — H43813 Vitreous degeneration, bilateral: Secondary | ICD-10-CM | POA: Diagnosis not present

## 2021-04-10 DIAGNOSIS — R635 Abnormal weight gain: Secondary | ICD-10-CM | POA: Diagnosis not present

## 2021-04-10 DIAGNOSIS — I10 Essential (primary) hypertension: Secondary | ICD-10-CM | POA: Diagnosis not present

## 2021-04-10 DIAGNOSIS — G4700 Insomnia, unspecified: Secondary | ICD-10-CM | POA: Diagnosis not present

## 2021-04-10 DIAGNOSIS — R0602 Shortness of breath: Secondary | ICD-10-CM | POA: Diagnosis not present

## 2021-04-29 DIAGNOSIS — N3281 Overactive bladder: Secondary | ICD-10-CM | POA: Diagnosis not present

## 2021-04-29 DIAGNOSIS — Z6835 Body mass index (BMI) 35.0-35.9, adult: Secondary | ICD-10-CM | POA: Diagnosis not present

## 2021-04-29 DIAGNOSIS — Z124 Encounter for screening for malignant neoplasm of cervix: Secondary | ICD-10-CM | POA: Diagnosis not present

## 2021-04-29 DIAGNOSIS — N952 Postmenopausal atrophic vaginitis: Secondary | ICD-10-CM | POA: Diagnosis not present

## 2021-04-29 DIAGNOSIS — Z1231 Encounter for screening mammogram for malignant neoplasm of breast: Secondary | ICD-10-CM | POA: Diagnosis not present

## 2021-05-15 DIAGNOSIS — E7800 Pure hypercholesterolemia, unspecified: Secondary | ICD-10-CM | POA: Diagnosis not present

## 2021-05-15 DIAGNOSIS — I10 Essential (primary) hypertension: Secondary | ICD-10-CM | POA: Diagnosis not present

## 2021-05-15 DIAGNOSIS — R635 Abnormal weight gain: Secondary | ICD-10-CM | POA: Diagnosis not present

## 2021-05-15 DIAGNOSIS — R0602 Shortness of breath: Secondary | ICD-10-CM | POA: Diagnosis not present

## 2021-05-21 NOTE — Patient Instructions (Addendum)
Health Maintenance Due  Topic Date Due   Zoster Vaccines- Shingrix (2 of 2) team please call [facility] on [address] for date of 2nd shingrix 12/18/2017   Sign release of information at the check out desk for mammogram and bone density at the front desk from Dr. Clementeen Hoof office  Sign release of information at the check out desk for last colonoscopy from [facility]  We will call you within two weeks about your referral to dermatology- I requested Dr. Juel Burrow offices. If you do not hear within 2 weeks, give [place] a call.   Please stop by lab before you go If you have mychart- we will send your results within 3 business days of [place] receiving them.  If you do not have mychart- we will call you about results within 5 business days of [place] receiving them.  *please also note that you will see labs on mychart as soon as they post. I will later go in and write notes on them- will say "notes from Dr. Yong Channel"  Recommended follow up: Return in about 1 year (around 05/23/2022) for physical or sooner if needed.

## 2021-05-21 NOTE — Progress Notes (Signed)
Phone [phone]   Subjective:  Patient presents today for their annual physical. Chief complaint-noted.   See problem oriented charting- ROS- full  review of systems was completed and negative except for: runny nose, seasonal allergies- ragweed, urinary urgency, feet numbness/tingling, skin lesions on face  The following were reviewed and entered/updated in epic: Past Medical History:  Diagnosis Date   Allergy    Anxiety    Depression    Hyperlipidemia    Hypertension    Obese    Patient Active Problem List   Diagnosis Date Noted   Migraine headache with aura 10/03/2014    Priority: 2.   Essential hypertension 07/03/2008    Priority: 2.   Hyperlipidemia 01/08/2007    Priority: 2.   Depression 01/08/2007    Priority: 2.   History of total hysterectomy 05/06/2018    Priority: 3.   Hip bursitis 05/12/2016    Priority: 3.   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   BENIGN POSITIONAL VERTIGO 05/22/2009    Priority: 3.   Obesity 01/24/2008    Priority: 3.   Allergic rhinitis 01/08/2007    Priority: 3.   Past Surgical History:  Procedure Laterality Date   ABDOMINAL HYSTERECTOMY     carpal tunnel surgery bilaterally     CHOLECYSTECTOMY     left foot fascia release     parotid gland surgery right side      Family History  Problem Relation Age of Onset   Hypertension Mother    Angina Mother        no stents   Uterine cancer Mother    Alzheimer's disease Mother        in facility since 06-Nov-2014. died May 08, 2020   Heart attack Father        mid 50s. died at 58   Glaucoma Father     Medications- reviewed and updated Current Outpatient Medications  Medication Sig Dispense Refill   aspirin 81 MG EC tablet Take 81 mg by mouth daily.     aspirin-acetaminophen-caffeine (EXCEDRIN MIGRAINE) 250-250-65 MG tablet Take by mouth every 6 (six) hours as needed for headache.     atenolol (TENORMIN) 25 MG tablet TAKE ONE TABLET BY MOUTH DAILY; **MUST CALL MD FOR APPOINTMENT  FOR FURTHER REFILLS 90 tablet 3   azelastine (ASTELIN) 0.1 % nasal spray Place 2 sprays into both nostrils 2 (two) times daily. Use in each nostril as directed 30 mL 12   calcium carbonate (OS-CAL) 600 MG TABS Take 600 mg by mouth 2 (two) times daily with a meal.     citalopram (CELEXA) 10 MG tablet Take 1 tablet (10 mg total) by mouth daily. 90 tablet 2   fexofenadine (ALLEGRA) 60 MG tablet Take 60 mg by mouth 2 (two) times daily.     fish oil-omega-3 fatty acids 1000 MG capsule Take 1 g by mouth daily.     gabapentin (NEURONTIN) 100 MG capsule Take 1 capsule (100 mg total) by mouth at bedtime as needed. 90 capsule 3   Multiple Vitamin (MULTIVITAMIN) tablet Take 1 tablet by mouth daily.     phentermine 37.5 MG capsule Take 37.5 mg by mouth every morning.     simvastatin (ZOCOR) 40 MG tablet Take 1 tablet (40 mg total) by mouth at bedtime. 90 tablet 3   vitamin B-12 (CYANOCOBALAMIN) 1000 MCG tablet Take 2,000 mcg by mouth daily.     citalopram (CELEXA) 20 MG tablet Take 1 tablet (20 mg total) by mouth  daily. 30 tablet 2   No current facility-administered medications for this visit.    Allergies-reviewed and updated Allergies  Allergen Reactions   Prednisone Other (See Comments)    Pt does not tolerate oral prednisone-causes severe migraine.   Injections only    Social History   Social History Narrative   Lives with her husband. Their adult children live nearby- 3 children. 3 grandkids.       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 Oropharynx normal Neck: no thyromegaly CV: RRR no murmurs rubs or gallops Lungs: CTAB no crackles, wheeze, rhonchi Abdomen: soft/nontender/nondistended/normal bowel sounds. No rebound or guarding.  Ext: no edema Skin: warm, dry Neuro: grossly normal, moves all extremities, PERRLA   Assessment and Plan   65 y.o. female presenting for annual physical.  Health Maintenance counseling: 1. Anticipatory guidance: Patient counseled regarding regular dental exams -q6 months, eye exams -yearly,  avoiding smoking and second hand smoke , limiting alcohol to 1 beverage per day- doesn't drink, no illicit drugs .   2. Risk factor reduction:  Advised patient of need for regular exercise and diet rich and fruits and vegetables to reduce risk of heart attack and stroke. Exercise- walking 3-4 days a week for 30-45 mins. Diet-stress eating after losing parents last year- trying to cut down on later eating- doing smaller portions and plates- more veggies.  -also doing phentermine with Dr. Lennice Sites with [facility].  Wt Readings from Last 3 Encounters:  05/23/21 229 lb 12.8 oz (104.2 kg)  10/22/20 217 lb (98.4 kg)  09/27/20 212 lb (96.2 kg)  3. Immunizations/screenings/ancillary studies- shingrix already had- trying to get records. Had bivalent booster on 05/06/21. Consider prevnar 20 in future Immunization History  Administered Date(s) Administered   Fluad Quad(high Dose 65+) 05/21/2020, 05/06/2021   Influenza Split 04/15/2012   Influenza Whole 08/16/2009   Influenza,inj,Quad PF,6+ Mos 05/08/2014, 05/22/2015, 05/12/2016, 04/30/2017, 05/06/2018, 05/11/2019   Influenza,inj,quad, With Preservative 04/11/2018   PFIZER(Purple Top)SARS-COV-2 Vaccination 10/15/2019, 11/05/2019   Pneumococcal Conjugate-13 05/22/2015   Pneumococcal Polysaccharide-23 05/21/2020   Td 01/02/2004   Tdap 05/05/2013   Unspecified SARS-COV-2 Vaccination 05/06/2021   Zoster Recombinat (Shingrix) 10/23/2017   4. Cervical cancer screening- history of hysterectomy including cervix- still has ovaries.  Follows with Dr. Gertie Fey who decides on Pap smears-technically past age based screening requirements. Just saw in septmber 5. Breast cancer screening-  breast exam with gynecology and mammogram  at that time as well- we will have her sign ROI 6. Colon cancer screening - 2019 at [facility] with 10-year follow-up planned 7. Skin cancer screening-does not see dermatology- but open to referral today. advised regular sunscreen use. Denies worrisome, changing, or new skin lesions- other than a few skin lesions on face - these appear to be seborrheic keratosis  8. Birth control/STD check- hysterectomy/monogamous  9. Osteoporosis screening at 73- had last year- we will get  records- was told normal with GYN 10. Smoking associated screening -never smoker-no screening needed  Status of chronic or acute concerns   #Social update-lost both her parents in 2021-she reports still working through grieving  -older sister taking her on [place] tour/[place] month of [place] next year  # urinary urgency issues- seeing urology soon Dr. McDiarmid of [facility]  #hypertension S: medication:  atenolol 25 mg daily - hctz 25 mg in the past- not needing now Home readings #s: regularly checks 120s- 130s/70s-low 80s BP Readings from Last 3 Encounters:  05/23/21 122/84  10/22/20 (!) 149/79  05/21/20 124/80  A/P:  Controlled. Continue current medications.   -thankfully bp doing ok despite phentermine through [facility]  #hyperlipidemia S: Medication:Simvastatin 40 mg daily Lab Results  Component Value Date   CHOL 180 05/21/2020   HDL 43 (L) 05/21/2020   LDLCALC 112 (H) 05/21/2020   LDLDIRECT 103.0 05/11/2019   TRIG 136 05/21/2020   CHOLHDL 4.2 05/21/2020   A/P: Lipids mildly elevated but we have been hesitant to increase strength of statin due to prediabetes-instead focusing on lifestyle changes  #  Hyperglycemia/insulin resistance/prediabetes S:  Medication: none  Exercise and diet- discussed healthy eating/regular exercise Lab Results  Component Value Date   HGBA1C 5.6 05/21/2020   HGBA1C 5.9 05/11/2019  A/P: Thankfully last year A1c was improving-if remains lower today could potentially consider increasing statin-continue to work on lifestyle  # Depression S: Medication: Celexa 20 mg daily  Depression screen [facility] 2/9 05/23/2021 05/21/2020 05/11/2019  Decreased Interest 0 0 0  Down, Depressed, Hopeless 0 0 0  PHQ - 2 Score 0 0 0  Altered sleeping 0 0 0  Tired, decreased energy 0 0 1  Change in appetite 0 0 0  Feeling bad or failure about yourself  0 0 0  Trouble concentrating 0 0 0  Moving slowly or fidgety/restless 0 0 0  Suicidal thoughts 0 0 0  PHQ-9 Score 0 0 1  Difficult doing work/chores Not difficult at all Not difficult at all -  A/P: Full remission-continue current medications. Stress much better with retirement and being further out from parents death  # B12 deficiency S: Current treatment/medication (oral vs. IM): Vitamin B-12 2000 mcg daily  Lab Results  Component Value Date   [id number] 11/07/2019  A/P: hopefully stable- update b12 today. Continue current meds for now   #numbness/tingling-no obvious cause on labs last few years including normal B12, TSH, A1c.   -continues to have some numbness/tingling in feet. Feels like an internal itch and then will eventually subside. Can keep her up at night. Particularly toes and in both feet.   -since nighttime is worst will trial low dose gabapentin 100mg  before bed - prior swelling is better  #Migraines with aura-very sparing Vicodin in the past  (twice a year)- but primarily used Excedrin.    #Seasonal allergies-does well on combo of Allegra (switched from claritin)-also uses astelin and flonase- worse in the fall and gets ear congestoin.  No recent wheezing. Never had SOB.   Recommended follow up: Prefers 1  year physical-okay with me as long as monitoring home blood pressure Future Appointments  Date Time Provider [place]  05/27/2021  2:30 PM [facility]    Lab/Order associations:   ICD-10-CM   1. Preventative health care  Z00.00 phentermine 37.5 MG capsule    CBC with Differential/Platelet    Comprehensive metabolic panel    Lipid panel    Hemoglobin  A1c    Ambulatory referral to Dermatology    2. Hyperlipidemia, unspecified hyperlipidemia type  E78.5 CBC with Differential/Platelet    Comprehensive metabolic panel    Lipid panel    3. Essential hypertension  I10     4. Recurrent major depressive disorder, in full remission ([place])  F33.42     5. Migraine with aura and without status migrainosus, not intractable  G43.109     6. Seasonal allergic rhinitis, unspecified trigger  J30.2     7. Screening exam for skin cancer  Z12.83 Ambulatory referral to Dermatology    8. Hyperglycemia  R73.9 Hemoglobin A1c    9. B12 deficiency  E53.8 phentermine 37.5 MG capsule    Vitamin B12     Meds ordered this encounter  Medications   gabapentin (NEURONTIN) 100 MG capsule    Sig: Take 1 capsule (100 mg total) by mouth at bedtime as needed.    Dispense:  90 capsule    Refill:  3    Return precautions advised. Garret Reddish, MD

## 2021-05-23 ENCOUNTER — Other Ambulatory Visit: Payer: Self-pay

## 2021-05-23 ENCOUNTER — Ambulatory Visit (INDEPENDENT_AMBULATORY_CARE_PROVIDER_SITE_OTHER): Payer: Medicare HMO | Admitting: Family Medicine

## 2021-05-23 ENCOUNTER — Encounter: Payer: Self-pay | Admitting: Family Medicine

## 2021-05-23 VITALS — BP 122/84 | HR 69 | Temp 97.2°F | Ht 67.0 in | Wt 229.8 lb

## 2021-05-23 DIAGNOSIS — G43109 Migraine with aura, not intractable, without status migrainosus: Secondary | ICD-10-CM | POA: Diagnosis not present

## 2021-05-23 DIAGNOSIS — R739 Hyperglycemia, unspecified: Secondary | ICD-10-CM

## 2021-05-23 DIAGNOSIS — Z1283 Encounter for screening for malignant neoplasm of skin: Secondary | ICD-10-CM

## 2021-05-23 DIAGNOSIS — Z0000 Encounter for general adult medical examination without abnormal findings: Secondary | ICD-10-CM | POA: Diagnosis not present

## 2021-05-23 DIAGNOSIS — J302 Other seasonal allergic rhinitis: Secondary | ICD-10-CM | POA: Diagnosis not present

## 2021-05-23 DIAGNOSIS — I10 Essential (primary) hypertension: Secondary | ICD-10-CM

## 2021-05-23 DIAGNOSIS — E538 Deficiency of other specified B group vitamins: Secondary | ICD-10-CM

## 2021-05-23 DIAGNOSIS — E785 Hyperlipidemia, unspecified: Secondary | ICD-10-CM | POA: Diagnosis not present

## 2021-05-23 DIAGNOSIS — F3342 Major depressive disorder, recurrent, in full remission: Secondary | ICD-10-CM

## 2021-05-23 LAB — COMPREHENSIVE METABOLIC PANEL
ALT: 15 U/L (ref 0–35)
AST: 20 U/L (ref 0–37)
Albumin: 4.4 g/dL (ref 3.5–5.2)
Alkaline Phosphatase: 64 U/L (ref 39–117)
BUN: 18 mg/dL (ref 6–23)
CO2: 30 mEq/L (ref 19–32)
Calcium: 10 mg/dL (ref 8.4–10.5)
Chloride: 102 mEq/L (ref 96–112)
Creatinine, Ser: 0.68 mg/dL (ref 0.40–1.20)
GFR: 90.62 mL/min (ref >60.00)
Glucose, Bld: 102 mg/dL — ABNORMAL HIGH (ref 70–99)
Potassium: 3.9 mEq/L (ref 3.5–5.1)
Sodium: 139 mEq/L (ref 135–145)
Total Bilirubin: 0.5 mg/dL (ref 0.2–1.2)
Total Protein: 7.1 g/dL (ref 6.0–8.3)

## 2021-05-23 LAB — CBC WITH DIFFERENTIAL/PLATELET
Basophils Absolute: 0.1 10*3/uL (ref 0.0–0.1)
Basophils Relative: 0.7 % (ref 0.0–3.0)
Eosinophils Absolute: 0.2 10*3/uL (ref 0.0–0.7)
Eosinophils Relative: 3.1 % (ref 0.0–5.0)
HCT: 45.3 % (ref 36.0–46.0)
Hemoglobin: 14.8 g/dL (ref 12.0–15.0)
Lymphocytes Relative: 38.4 % (ref 12.0–46.0)
Lymphs Abs: 2.9 10*3/uL (ref 0.7–4.0)
MCHC: 32.7 g/dL (ref 30.0–36.0)
MCV: 94 fl (ref 78.0–100.0)
Monocytes Absolute: 0.6 10*3/uL (ref 0.1–1.0)
Monocytes Relative: 8.5 % (ref 3.0–12.0)
Neutro Abs: 3.7 10*3/uL (ref 1.4–7.7)
Neutrophils Relative %: 49.3 % (ref 43.0–77.0)
Platelets: 240 10*3/uL (ref 150.0–400.0)
RBC: 4.82 Mil/uL (ref 3.87–5.11)
RDW: 13.2 % (ref 11.5–15.5)
WBC: 7.4 10*3/uL (ref 4.0–10.5)

## 2021-05-23 LAB — LIPID PANEL
Cholesterol: 168 mg/dL (ref 0–200)
HDL: 41 mg/dL (ref >39.00)
NonHDL: 126.65
Total CHOL/HDL Ratio: 4
Triglycerides: 274 mg/dL — ABNORMAL HIGH (ref 0.0–149.0)
VLDL: 54.8 mg/dL — ABNORMAL HIGH (ref 0.0–40.0)

## 2021-05-23 LAB — HEMOGLOBIN A1C: Hgb A1c MFr Bld: 5.9 % (ref 4.6–6.5)

## 2021-05-23 LAB — VITAMIN B12: Vitamin B-12: 561 pg/mL (ref 211–911)

## 2021-05-23 LAB — LDL CHOLESTEROL, DIRECT: Direct LDL: 104 mg/dL

## 2021-05-23 MED ORDER — GABAPENTIN 100 MG PO CAPS: 100.0000 mg | ORAL_CAPSULE | Freq: Every evening | ORAL | 3 refills | Status: DC | PRN

## 2021-05-27 ENCOUNTER — Ambulatory Visit (INDEPENDENT_AMBULATORY_CARE_PROVIDER_SITE_OTHER): Payer: Medicare HMO

## 2021-05-27 DIAGNOSIS — Z0000 Encounter for general adult medical examination without abnormal findings: Secondary | ICD-10-CM

## 2021-05-27 NOTE — Patient Instructions (Signed)
Rachel Gates , Thank you for taking time to come for your Medicare Wellness Visit. I appreciate your ongoing commitment to your health goals. Please review the following plan we discussed and let me know if I can assist you in the future.   Screening recommendations/referrals: Colonoscopy: Done 10/16/16 repeat every 10 years  Mammogram: Done 04/2021 repeat every year  Bone Density: Done 01/23/20 repeat every 2 years  Recommended yearly ophthalmology/optometry visit for glaucoma screening and checkup Recommended yearly dental visit for hygiene and checkup  Vaccinations: Influenza vaccine: Done 05/06/21 repeat every year Pneumococcal vaccine: Up to date Tdap vaccine: Done 05/05/13 repeat every 10 years  Shingles vaccine: 1st dose 10/23/17   Covid-19:Completed 3/6 & 11/05/19 & 05/06/21  Advanced directives: Please bring a copy of your health care power of attorney and living will to the office at your convenience.  Conditions/risks identified: Get A1C down   Next appointment: Follow up in one year for your annual wellness visit    Preventive Care 65 Years and Older, Female Preventive care refers to lifestyle choices and visits with your health care provider that can promote health and wellness. What does preventive care include? A yearly physical exam. This is also called an annual well check. Dental exams once or twice a year. Routine eye exams. Ask your health care provider how often you should have your eyes checked. Personal lifestyle choices, including: Daily care of your teeth and gums. Regular physical activity. Eating a healthy diet. Avoiding tobacco and drug use. Limiting alcohol use. Practicing safe sex. Taking low-dose aspirin every day. Taking vitamin and mineral supplements as recommended by your health care provider. What happens during an annual well check? The services and screenings done by your health care provider during your annual well check will depend on your age,  overall health, lifestyle risk factors, and family history of disease. Counseling  Your health care provider may ask you questions about your: Alcohol use. Tobacco use. Drug use. Emotional well-being. Home and relationship well-being. Sexual activity. Eating habits. History of falls. Memory and ability to understand (cognition). Work and work Statistician. Reproductive health. Screening  You may have the following tests or measurements: Height, weight, and BMI. Blood pressure. Lipid and cholesterol levels. These may be checked every 5 years, or more frequently if you are over 22 years old. Skin check. Lung cancer screening. You may have this screening every year starting at age 91 if you have a 30-pack-year history of smoking and currently smoke or have quit within the past 15 years. Fecal occult blood test (FOBT) of the stool. You may have this test every year starting at age 62. Flexible sigmoidoscopy or colonoscopy. You may have a sigmoidoscopy every 5 years or a colonoscopy every 10 years starting at age 1. Hepatitis C blood test. Hepatitis B blood test. Sexually transmitted disease (STD) testing. Diabetes screening. This is done by checking your blood sugar (glucose) after you have not eaten for a while (fasting). You may have this done every 1-3 years. Bone density scan. This is done to screen for osteoporosis. You may have this done starting at age 4. Mammogram. This may be done every 1-2 years. Talk to your health care provider about how often you should have regular mammograms. Talk with your health care provider about your test results, treatment options, and if necessary, the need for more tests. Vaccines  Your health care provider may recommend certain vaccines, such as: Influenza vaccine. This is recommended every year. Tetanus, diphtheria, and  acellular pertussis (Tdap, Td) vaccine. You may need a Td booster every 10 years. Zoster vaccine. You may need this after age  38. Pneumococcal 13-valent conjugate (PCV13) vaccine. One dose is recommended after age 44. Pneumococcal polysaccharide (PPSV23) vaccine. One dose is recommended after age 68. Talk to your health care provider about which screenings and vaccines you need and how often you need them. This information is not intended to replace advice given to you by your health care provider. Make sure you discuss any questions you have with your health care provider. Document Released: 08/24/2015 Document Revised: 04/16/2016 Document Reviewed: 05/29/2015 Elsevier Interactive Patient Education  2017 [place] Prevention in the Home Falls can cause injuries. They can happen to people of all ages. There are many things you can do to make your home safe and to help prevent falls. What can I do on the outside of my home? Regularly fix the edges of walkways and driveways and fix any cracks. Remove anything that might make you trip as you walk through a door, such as a raised step or threshold. Trim any bushes or trees on the path to your home. Use bright outdoor lighting. Clear any walking paths of anything that might make someone trip, such as rocks or tools. Regularly check to see if handrails are loose or broken. Make sure that both sides of any steps have handrails. Any raised decks and porches should have guardrails on the edges. Have any leaves, snow, or ice cleared regularly. Use sand or salt on walking paths during winter. Clean up any spills in your garage right away. This includes oil or grease spills. What can I do in the bathroom? Use night lights. Install grab bars by the toilet and in the tub and shower. Do not use towel bars as grab bars. Use non-skid mats or decals in the tub or shower. If you need to sit down in the shower, use a plastic, non-slip stool. Keep the floor dry. Clean up any water that spills on the floor as soon as it happens. Remove soap buildup in the tub or shower  regularly. Attach bath mats securely with double-sided non-slip rug tape. Do not have throw rugs and other things on the floor that can make you trip. What can I do in the bedroom? Use night lights. Make sure that you have a light by your bed that is easy to reach. Do not use any sheets or blankets that are too big for your bed. They should not hang down onto the floor. Have a firm chair that has side arms. You can use this for support while you get dressed. Do not have throw rugs and other things on the floor that can make you trip. What can I do in the kitchen? Clean up any spills right away. Avoid walking on wet floors. Keep items that you use a lot in easy-to-reach places. If you need to reach something above you, use a strong step stool that has a grab bar. Keep electrical cords out of the way. Do not use floor polish or wax that makes floors slippery. If you must use wax, use non-skid floor wax. Do not have throw rugs and other things on the floor that can make you trip. What can I do with my stairs? Do not leave any items on the stairs. Make sure that there are handrails on both sides of the stairs and use them. Fix handrails that are broken or loose. Make sure that  handrails are as long as the stairways. Check any carpeting to make sure that it is firmly attached to the stairs. Fix any carpet that is loose or worn. Avoid having throw rugs at the top or bottom of the stairs. If you do have throw rugs, attach them to the floor with carpet tape. Make sure that you have a light switch at the top of the stairs and the bottom of the stairs. If you do not have them, ask someone to add them for you. What else can I do to help prevent falls? Wear shoes that: Do not have high heels. Have rubber bottoms. Are comfortable and fit you well. Are closed at the toe. Do not wear sandals. If you use a stepladder: Make sure that it is fully opened. Do not climb a closed stepladder. Make sure that  both sides of the stepladder are locked into place. Ask someone to hold it for you, if possible. Clearly mark and make sure that you can see: Any grab bars or handrails. First and last steps. Where the edge of each step is. Use tools that help you move around (mobility aids) if they are needed. These include: Canes. Walkers. Scooters. Crutches. Turn on the lights when you go into a dark area. Replace any light bulbs as soon as they burn out. Set up your furniture so you have a clear path. Avoid moving your furniture around. If any of your floors are uneven, fix them. If there are any pets around you, be aware of where they are. Review your medicines with your doctor. Some medicines can make you feel dizzy. This can increase your chance of falling. Ask your doctor what other things that you can do to help prevent falls. This information is not intended to replace advice given to you by your health care provider. Make sure you discuss any questions you have with your health care provider. Document Released: 05/24/2009 Document Revised: 01/03/2016 Document Reviewed: 09/01/2014 Elsevier Interactive Patient Education  2017 [employer].

## 2021-05-27 NOTE — Progress Notes (Addendum)
Virtual Visit via Telephone Note  I connected with  Rachel Gates on 05/27/21 at  2:30 PM EDT by telephone and verified that I am speaking with the correct person using two identifiers.  Medicare Annual Wellness visit completed telephonically due to Covid-19 pandemic.   Persons participating in this call: This Health Coach and this patient.   Location: Patient: Home  Provider: Office Willette Brace, LPN   Subjective:   Rachel Gates is a 65 y.o. female who presents for an Initial Medicare Annual Wellness Visit.  Review of Systems     Cardiac Risk Factors include: advanced age (>23men, >24 women);hypertension;dyslipidemia;obesity (BMI >30kg/m2)     Objective:    There were no vitals filed for this visit. There is no height or weight on file to calculate BMI.  Advanced Directives 05/27/2021  Does Patient Have a Medical Advance Directive? Yes  Does patient want to make changes to medical advance directive? Yes (MAU/Ambulatory/Procedural Areas - Information given)    Current Medications (verified) Outpatient Encounter Medications as of 05/27/2021  Medication Sig   aspirin 81 MG EC tablet Take 81 mg by mouth daily.   aspirin-acetaminophen-caffeine (EXCEDRIN MIGRAINE) 250-250-65 MG tablet Take by mouth every 6 (six) hours as needed for headache.   atenolol (TENORMIN) 25 MG tablet TAKE ONE TABLET BY MOUTH DAILY; **MUST CALL MD FOR APPOINTMENT FOR FURTHER REFILLS   azelastine (ASTELIN) 0.1 % nasal spray Place 2 sprays into both nostrils 2 (two) times daily. Use in each nostril as directed   calcium carbonate (OS-CAL) 600 MG TABS Take 600 mg by mouth 2 (two) times daily with a meal.   citalopram (CELEXA) 20 MG tablet Take 1 tablet (20 mg total) by mouth daily.   fexofenadine (ALLEGRA) 60 MG tablet Take 60 mg by mouth 2 (two) times daily.   fish oil-omega-3 fatty acids 1000 MG capsule Take 1 g by mouth daily.   gabapentin (NEURONTIN) 100 MG capsule Take 1 capsule (100 mg total) by  mouth at bedtime as needed.   Multiple Vitamin (MULTIVITAMIN) tablet Take 1 tablet by mouth daily.   phentermine 37.5 MG capsule Take 37.5 mg by mouth every morning.   simvastatin (ZOCOR) 40 MG tablet Take 1 tablet (40 mg total) by mouth at bedtime.   vitamin B-12 (CYANOCOBALAMIN) 1000 MCG tablet Take 2,000 mcg by mouth daily.   citalopram (CELEXA) 10 MG tablet Take 1 tablet (10 mg total) by mouth daily. (Patient not taking: Reported on 05/27/2021)   No facility-administered encounter medications on file as of 05/27/2021.    Allergies (verified) Prednisone   History: Past Medical History:  Diagnosis Date   Allergy    Anxiety    Depression    Hyperlipidemia    Hypertension    Obese    Past Surgical History:  Procedure Laterality Date   ABDOMINAL HYSTERECTOMY     carpal tunnel surgery bilaterally     CHOLECYSTECTOMY     left foot fascia release     parotid gland surgery right side     Family History  Problem Relation Age of Onset   Hypertension Mother    Angina Mother        no stents   Uterine cancer Mother    Alzheimer's disease Mother        in facility since November 17, 2014. died 05/19/20   Heart attack Father        mid 84s. died at 43   Glaucoma Father    Social History  Socioeconomic History   Marital status: Married    Spouse name: Rachel Gates   Number of children: 3   Years of education: 12+   Highest education level: Not on file  Occupational History   Occupation: Solicitor: [employer]  Tobacco Use   Smoking status: Never   Smokeless tobacco: Never  Substance and Sexual Activity   Alcohol use: No   Drug use: No   Sexual activity: Not on file  Other Topics Concern   Not on file  Social History Narrative   Lives with her husband. Their adult children live nearby- 3 children. 3 grandkids.       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- lost them in 2021 as well      Hobbies: caretaker role  takes a lot of her time, paint, cross stitch, gardenening, reading    Social Determinants of Health   Financial Resource Strain: Low Risk    Difficulty of Paying Living Expenses: Not hard at all  Food Insecurity: No Food Insecurity   Worried About Charity fundraiser in the Last Year: Never true   [place] in the Last Year: Never true  Transportation Needs: No Transportation Needs   Lack of Transportation (Medical): No   Lack of Transportation (Non-Medical): No  Physical Activity: Sufficiently Active   Days of Exercise per Week: 5 days   Minutes of Exercise per Session: 50 min  Stress: No Stress Concern Present   Feeling of Stress : Not at all  Social Connections: Moderately Integrated   Frequency of Communication with Friends and Family: More than three times a week   Frequency of Social Gatherings with Friends and Family: More than three times a week   Attends Religious Services: 1 to 4 times per year   Active Member of [employer] or Organizations: No   Attends Music therapist: Never   Marital Status: Married    Tobacco Counseling Counseling given: Not Answered   Clinical Intake:  Pre-visit preparation completed: Yes  Pain : No/denies pain     BMI - recorded: 35.99 Nutritional Status: BMI > 30  Obese Nutritional Risks: None Diabetes: No  How often do you need to have someone help you when you read instructions, pamphlets, or other written materials from your doctor or pharmacy?: 1 - Never  Diabetic?No  Interpreter Needed?: No  Information entered by :: Rachel Rakes, LPN   Activities of Daily Living In your present state of health, do you have any difficulty performing the following activities: 05/27/2021 05/23/2021  Hearing? N N  Vision? N N  Difficulty concentrating or making decisions? N N  Walking or climbing stairs? N N  Dressing or bathing? N N  Doing errands, shopping? N N  Preparing Food and eating ? N -  Using the Toilet? N -   In the past six months, have you accidently leaked urine? N -  Do you have problems with loss of bowel control? N -  Managing your Medications? N -  Managing your Finances? N -  Housekeeping or managing your Housekeeping? N -  Some recent data might be hidden    Patient Care Team: Marin Olp, MD as PCP - General (Family Medicine)  Indicate any recent Medical Services you may have received from other than [facility] providers in the past year (date may be approximate).     Assessment:   This is a  routine wellness examination for Rachel Gates.  Hearing/Vision screen Hearing Screening - Comments:: Pt denies any hearing  Vision Screening - Comments:: Pt follows up with [facility] for [facility] eye exams   Dietary issues and exercise activities discussed: Current Exercise Habits: Home exercise routine, Type of exercise: walking;Other - see comments, Time (Minutes): 50   Goals Addressed             This Visit's Progress    Patient Stated       Getting A1C down       Depression Screen PHQ 2/9 Scores 05/27/2021 05/23/2021 05/21/2020 05/11/2019 05/06/2018 04/30/2017 09/26/2015  PHQ - 2 Score 0 0 0 0 0 0 0  PHQ- 9 Score - 0 0 1 3 0 -    Fall Risk Fall Risk  05/27/2021 05/23/2021 05/21/2020  Falls in the past year? 0 0 0  Number falls in past yr: 0 0 0  Injury with Fall? 0 0 0  Risk for fall due to : Impaired vision History of fall(s) -  Follow up Falls prevention discussed Falls evaluation completed -    FALL RISK PREVENTION PERTAINING TO THE HOME:  Any stairs in or around the home? Yes  If so, are there any without handrails? Yes  Home free of loose throw rugs in walkways, pet beds, electrical cords, etc? Yes  Adequate lighting in your home to reduce risk of falls? Yes   ASSISTIVE DEVICES UTILIZED TO PREVENT FALLS:  Life alert? No  Use of a cane, walker or w/c? No  Grab bars in the bathroom? No  Shower chair or bench in shower? No  Elevated toilet seat or a handicapped  toilet? No   TIMED UP AND GO:  Was the test performed? No .  Cognitive Function:     6CIT Screen 05/27/2021  What Year? 0 points  What month? 0 points  What time? 0 points  Count back from 20 0 points  Months in reverse 0 points  Repeat phrase 0 points  Total Score 0    Immunizations Immunization History  Administered Date(s) Administered   Fluad Quad(high Dose 65+) 05/21/2020, 05/06/2021   Influenza Split 04/15/2012   Influenza Whole 08/16/2009   Influenza,inj,Quad PF,6+ Mos 05/08/2014, 05/22/2015, 05/12/2016, 04/30/2017, 05/06/2018, 05/11/2019   Influenza,inj,quad, With Preservative 04/11/2018   PFIZER(Purple Top)SARS-COV-2 Vaccination 10/15/2019, 11/05/2019   Pneumococcal Conjugate-13 05/22/2015   Pneumococcal Polysaccharide-23 05/21/2020   Td 01/02/2004   Tdap 05/05/2013   Unspecified SARS-COV-2 Vaccination 05/06/2021   Zoster Recombinat (Shingrix) 10/23/2017    TDAP status: Up to date  Flu Vaccine status: Up to date  Pneumococcal vaccine status: Up to date  Covid-19 vaccine status: Completed vaccines  Qualifies for Shingles Vaccine? Yes   Zostavax completed No   Shingrix Completed?: Yes  Screening Tests Health Maintenance  Topic Date Due   Zoster Vaccines- Shingrix (2 of 2) 12/18/2017   Hepatitis C Screening  08/15/2098 (Originally 10/07/1973)   COVID-19 Vaccine (4 - Booster for Pfizer series) 09/05/2021   MAMMOGRAM  04/12/2023   TETANUS/TDAP  05/06/2023   COLONOSCOPY (Pts 45-54yrs Insurance coverage will need to be confirmed)  10/17/2026   INFLUENZA VACCINE  Completed   DEXA SCAN  Completed   HPV VACCINES  Aged Out    Health Maintenance  Health Maintenance Due  Topic Date Due   Zoster Vaccines- Shingrix (2 of 2) 12/18/2017    Colorectal cancer screening: Type of screening: Colonoscopy. Completed 10/16/16. Repeat every 10 years  Mammogram status: Completed 04/2021. Repeat  every year  Bone Density status: Completed 01/23/20. Results reflect: Bone  density results: NORMAL. Repeat every 2 years.   Additional Screening:  Hepatitis C Screening: does qualify;   Vision Screening: Recommended annual ophthalmology exams for early detection of glaucoma and other disorders of the eye. Is the patient up to date with their annual eye exam?  Yes  Who is the provider or what is the name of the office in which the patient attends annual eye exams? [facility] If pt is not established with a provider, would they like to be referred to a provider to establish care? No .   Dental Screening: Recommended annual dental exams for proper oral hygiene  Community Resource Referral / Chronic Care Management: CRR required this visit?  No   CCM required this visit?  No      Plan:     I have personally reviewed and noted the following in the patient's chart:   Medical and social history Use of alcohol, tobacco or illicit drugs  Current medications and supplements including opioid prescriptions. Patient is not currently taking opioid prescriptions. Functional ability and status Nutritional status Physical activity Advanced directives List of other physicians Hospitalizations, surgeries, and ER visits in previous 12 months Vitals Screenings to include cognitive, depression, and falls Referrals and appointments  In addition, I have reviewed and discussed with patient certain preventive protocols, quality metrics, and best practice recommendations. A written personalized care plan for preventive services as well as general preventive health recommendations were provided to patient.     Willette Brace, LPN   [id number]   Nurse Notes: None

## 2021-05-29 ENCOUNTER — Other Ambulatory Visit: Payer: Self-pay | Admitting: Family Medicine

## 2021-05-29 DIAGNOSIS — F3342 Major depressive disorder, recurrent, in full remission: Secondary | ICD-10-CM

## 2021-06-11 ENCOUNTER — Encounter: Payer: Self-pay | Admitting: Family Medicine

## 2021-06-18 DIAGNOSIS — N3946 Mixed incontinence: Secondary | ICD-10-CM | POA: Diagnosis not present

## 2021-06-18 DIAGNOSIS — R351 Nocturia: Secondary | ICD-10-CM | POA: Diagnosis not present

## 2021-06-19 LAB — HEMOGLOBIN A1C: Hemoglobin A1C: 5.8

## 2021-06-20 DIAGNOSIS — D225 Melanocytic nevi of trunk: Secondary | ICD-10-CM | POA: Diagnosis not present

## 2021-06-20 DIAGNOSIS — L821 Other seborrheic keratosis: Secondary | ICD-10-CM | POA: Diagnosis not present

## 2021-06-25 ENCOUNTER — Other Ambulatory Visit: Payer: Self-pay

## 2021-06-25 ENCOUNTER — Encounter: Payer: Self-pay | Admitting: Family Medicine

## 2021-06-25 ENCOUNTER — Ambulatory Visit (INDEPENDENT_AMBULATORY_CARE_PROVIDER_SITE_OTHER): Payer: Medicare HMO | Admitting: Family Medicine

## 2021-06-25 VITALS — BP 130/82 | HR 80 | Temp 97.3°F | Ht 67.0 in | Wt 230.8 lb

## 2021-06-25 DIAGNOSIS — M47816 Spondylosis without myelopathy or radiculopathy, lumbar region: Secondary | ICD-10-CM

## 2021-06-25 DIAGNOSIS — I10 Essential (primary) hypertension: Secondary | ICD-10-CM

## 2021-06-25 MED ORDER — CYCLOBENZAPRINE HCL 5 MG PO TABS: 5.0000 mg | ORAL_TABLET | Freq: Three times a day (TID) | ORAL | 1 refills | Status: DC | PRN

## 2021-06-25 MED ORDER — TRAMADOL HCL 50 MG PO TABS: 50.0000 mg | ORAL_TABLET | Freq: Three times a day (TID) | ORAL | 0 refills | Status: AC | PRN

## 2021-06-25 NOTE — Patient Instructions (Addendum)
Health Maintenance Due  Topic Date Due   Zoster Vaccines- Shingrix (2 of 2)- team please try to get the date of this- she states has had both- [facility] [address] 12/18/2017   Sorry your back is so tight right now. I think you may have strained and facet joint-one of the joints in the low back and this can also cause muscle spasm.  Your left low back is incredibly tight.  I am glad you do not have midline pain right now.  If you were to develop symptoms such as leg weakness, numbness or tingling in your legs, incontinence, numbness or tingling in your groin please seek care immediately but I do not see a sign of a bulging disc/pinched nerve at this time.  Try tramadol as needed for pain-may be reasonable to start with the Flexeril low for muscle relaxing.  Do not drive after taking either of these for up to 8 hours.  I would like [employer] you are remaining as active as possible without overdoing it-would recommend continuing this  Recommended follow up: Return for as needed for new, worsening, persistent symptoms.

## 2021-06-25 NOTE — Progress Notes (Signed)
Phone [phone] In person visit   Subjective:   Rachel Gates is a 65 y.o. year old very pleasant female patient who presents for/with See problem oriented charting Chief Complaint  Patient presents with   pulled back muscle    Pt c/o pulling muscle in back on Sunday night after maneuvering the wrong way. She has tried heat pads, ice and ibuprofen for pain with no relief.    This visit occurred during the SARS-CoV-2 public health emergency.  Safety protocols were in place, including screening questions prior to the visit, additional usage of staff PPE, and extensive cleaning of exam room while observing appropriate contact time as indicated for disinfecting solutions.   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     aspirin-acetaminophen-caffeine (EXCEDRIN MIGRAINE) 250-250-65 MG tablet Take by mouth every 6 (six) hours as needed for headache.     atenolol (TENORMIN) 25 MG tablet TAKE ONE TABLET BY MOUTH DAILY; **MUST CALL MD FOR APPOINTMENT FOR FURTHER REFILLS 90 tablet 3   azelastine (ASTELIN) 0.1 % nasal spray Place 2 sprays into both nostrils 2 (two) times daily. Use in each nostril as directed 30 mL 12   calcium carbonate (OS-CAL) 600 MG TABS Take 600 mg by mouth 2 (two) times daily with  a meal.     citalopram (CELEXA) 10 MG tablet TAKE 1 TABLET EVERY DAY 90 tablet 2   cyclobenzaprine (FLEXERIL) 5 MG tablet Take 1 tablet (5 mg total) by mouth 3 (three) times daily as needed for muscle spasms (do not drive for 8 hours after taking). 30 tablet 1   fexofenadine (ALLEGRA) 60 MG tablet Take 60 mg by mouth 2 (two) times daily.     fish oil-omega-3 fatty acids 1000 MG capsule Take 1 g by mouth daily.     gabapentin (NEURONTIN) 100 MG capsule Take 1 capsule (100 mg total) by mouth at bedtime as needed. 90 capsule 3   Multiple Vitamin (MULTIVITAMIN) tablet Take 1 tablet by mouth daily.     phentermine 37.5 MG capsule Take 37.5 mg by mouth every morning.     simvastatin (ZOCOR) 40 MG tablet Take 1 tablet (40 mg total) by mouth at bedtime. 90 tablet 3   traMADol (ULTRAM) 50 MG tablet Take 1 tablet (50 mg total) by mouth every 8 (eight) hours as needed for up to 5 days (do not drive for 8 hours after use). 15 tablet 0   vitamin B-12 (CYANOCOBALAMIN) 1000 MCG tablet Take 2,000 mcg by mouth daily.     No current facility-administered medications for this visit.     Objective:  BP 130/82   Pulse 80   Temp (!) 97.3 F (36.3 C)  Ht 5\' 7"  (1.702 m)   Wt 230 lb 12.8 oz (104.7 kg)   LMP  (LMP Unknown)   SpO2 95%   BMI 36.15 kg/m  Gen: NAD, resting comfortably CV: RRR no murmurs rubs or gallops Lungs: CTAB no crackles, wheeze, rhonchi Ext: Baseline edema under compression Skin: warm, dry Back - Normal skin, Spine with normal alignment and no deformity.  No tenderness to vertebral process palpation.  Paraspinous muscles are not tender and without spasm.   Range of motion is full at neck but limited due to pain in lumbar sacral regions with flexion. Negative Straight leg raise.  Neuro- no saddle anesthesia, 5/5 strength lower extremities     Assessment and Plan   # Strained back muscle-back pain left low back  S:Patient reports she was trying to lift a heavy cabinet by herself on  Sunday-  husband wanted to help but she lifted on her own- got off balance and felt something pull in the left low back- husband came into help.   Sunday night couldn't get comfortable- ibuprofen and heating pad was not helpful. Took 1 vicodin that she has for migraines and got some relief. Yesterday walked around and got some relief and able to get by with ibuprofen.   Today pain has worsened again and feels very tight even despite ibuprofen (no Vicodin yet today). Has not tried ice yet.   No history of back pain like this that she can recall initially. No midline back pain. No particular position helping  Patient did on review of chart have x-ray lumbar spine after an accident in 2005 on July 11th- "IMPRESSION  1. Mild lumbar scoliosis, convex to the right.    2. Degenerative change involving the facet joints, particularly L5-S1. "  ROS-No saddle anesthesia, bladder incontinence, fecal incontinence, weakness in extremity, numbness or tingling in extremity. History negative history of cancer, fever, chills, unintentional weight loss, recent bacterial infection, recent IV drug use, HIV, pain worse at night or while supine.   A/P: Concern for flareup of facet joint arthritis with resulting muscle spasm.  Patient very tight on exam.  No midline pain or other red flags per history or exam-gave emergent precautions.  From AVS "  Try tramadol as needed for pain-may be reasonable to start with the Flexeril low for muscle relaxing.  Do not drive after taking either of these for up to 8 hours.  I would like [facility] you are remaining as active as possible without overdoing it-would recommend continuing this  Recommended follow up: Return for as needed for new, worsening, persistent symptoms. "  #hypertension S: medication: atenolol 25 mg daily - hctz 25 mg in the past and no longer needing this  BP Readings from Last 3 Encounters:  06/25/21 130/82  05/23/21 122/84  10/22/20 (!) 149/79  A/P: Blood  pressure well controlled despite ibuprofen-encourage patient to monitor this in case she continues to take medication  Recommended follow up: Return for as needed for new, worsening, persistent symptoms. Future Appointments  Date Time Provider [place]  06/13/2022 10:40 AM Marin Olp, MD [facility]    Lab/Order associations:   ICD-10-CM   1. Arthritis of facet joint of lumbar spine  M47.816     2. Essential hypertension  I10       Meds ordered this encounter  Medications   cyclobenzaprine (FLEXERIL) 5 MG tablet    Sig: Take 1 tablet (5 mg total) by mouth 3 (three) times daily as needed for  muscle spasms (do not drive for 8 hours after taking).    Dispense:  30 tablet    Refill:  1   traMADol (ULTRAM) 50 MG tablet    Sig: Take 1 tablet (50 mg total) by mouth every 8 (eight) hours as needed for up to 5 days (do not drive for 8 hours after use).    Dispense:  15 tablet    Refill:  0   I,Harris Phan,acting as a scribe for Garret Reddish, MD.,have documented all relevant documentation on the behalf of Garret Reddish, MD,as directed by  Garret Reddish, MD while in the presence of Garret Reddish, MD.    I, Garret Reddish, MD, have reviewed all documentation for this visit. The documentation on 06/25/21 for the exam, diagnosis, procedures, and orders are all accurate and complete.   Return precautions advised.  Garret Reddish, MD

## 2021-07-11 DIAGNOSIS — L820 Inflamed seborrheic keratosis: Secondary | ICD-10-CM | POA: Diagnosis not present

## 2021-08-08 ENCOUNTER — Telehealth (INDEPENDENT_AMBULATORY_CARE_PROVIDER_SITE_OTHER): Payer: Medicare HMO | Admitting: Family Medicine

## 2021-08-08 ENCOUNTER — Encounter: Payer: Self-pay | Admitting: Family Medicine

## 2021-08-08 VITALS — Temp 101.0°F | Ht 67.0 in

## 2021-08-08 DIAGNOSIS — J329 Chronic sinusitis, unspecified: Secondary | ICD-10-CM

## 2021-08-08 MED ORDER — AZITHROMYCIN 250 MG PO TABS: ORAL_TABLET | ORAL | 0 refills | Status: DC

## 2021-08-08 MED ORDER — AZELASTINE HCL 0.1 % NA SOLN: 2.0000 | Freq: Two times a day (BID) | NASAL | 12 refills | Status: DC

## 2021-08-08 MED ORDER — GUAIFENESIN-CODEINE 100-10 MG/5ML PO SOLN: 5.0000 mL | Freq: Three times a day (TID) | ORAL | 0 refills | Status: DC | PRN

## 2021-08-08 NOTE — Progress Notes (Signed)
° °  Rachel Gates is a 65 y.o. female who presents today for a telephone visit.  Assessment/Plan:  Sinusitis No red flags.  Discussed limitations of telephone visit and inability to perform physical exam or any diagnostic testing.  Doubt COVID given her 2 negative COVID tests.  Flu is consideration however she has not been able to come in to get tested.  She has been vaccinated against flu.  We will treat with Astelin and guaifenesin-codeine cough syrup.  Sent in "pocket prescription" for azithromycin with instruction to not start until symptoms are improving over the next several days.  Encouraged good hydration.  Can continue over-the-counter meds.  Discussed reasons to return to care.  Follow-up as needed.    Subjective:  HPI:  Patient with URI symptoms for the last 3 days. Symptoms include fever, cough, and sore throat for the last few days. A lot of sputum production. Tried taking OTC medications such as sudafed and delsym which have not helped. COVID test was negative twice. Her granddaughter was sick with similar symptoms. No chest pain or shortness of breath.  No body aches. Granddaughter was negative for flu.        Objective/Observations   NAD  Telephone Visit   I connected with Rachel Gates on 08/08/21 at 11:40 AM EST via telephone and verified that I am speaking with the correct person using two identifiers. I discussed the limitations of evaluation and management by telemedicine and the availability of in person appointments. The patient expressed understanding and agreed to proceed.   Patient location: Home Provider location: [place] participating in the virtual visit: Myself and Patient   A total of 11 minutes were spent on medical discussion.      Algis Greenhouse. Jerline Pain, MD 08/08/2021 8:24 AM

## 2021-08-15 ENCOUNTER — Other Ambulatory Visit: Payer: Self-pay | Admitting: Family Medicine

## 2021-09-03 ENCOUNTER — Telehealth: Payer: Self-pay | Admitting: Family Medicine

## 2021-09-03 ENCOUNTER — Other Ambulatory Visit: Payer: Self-pay

## 2021-09-03 DIAGNOSIS — E785 Hyperlipidemia, unspecified: Secondary | ICD-10-CM

## 2021-09-03 DIAGNOSIS — G43109 Migraine with aura, not intractable, without status migrainosus: Secondary | ICD-10-CM

## 2021-09-03 DIAGNOSIS — F3342 Major depressive disorder, recurrent, in full remission: Secondary | ICD-10-CM

## 2021-09-03 MED ORDER — CITALOPRAM HYDROBROMIDE 10 MG PO TABS: 10.0000 mg | ORAL_TABLET | Freq: Every day | ORAL | 2 refills | Status: DC

## 2021-09-03 MED ORDER — ATENOLOL 25 MG PO TABS: ORAL_TABLET | ORAL | 3 refills | Status: DC

## 2021-09-03 MED ORDER — GABAPENTIN 100 MG PO CAPS: 100.0000 mg | ORAL_CAPSULE | Freq: Every evening | ORAL | 3 refills | Status: DC | PRN

## 2021-09-03 MED ORDER — SIMVASTATIN 40 MG PO TABS: 40.0000 mg | ORAL_TABLET | Freq: Every day | ORAL | 3 refills | Status: DC

## 2021-09-03 NOTE — Telephone Encounter (Signed)
Spoke with pt and was able to get new pharmacy information a/w adding he new mail order [place] to pts chart.  I have also sent over the medications requested for refill to the updated pharmacy.

## 2021-09-03 NOTE — Telephone Encounter (Signed)
Pt states she has switched insurance and also had to switch pharmacies. The new pharmacy will be needing new prescriptions for some of her medications: Citalopram (Celexa) 10 mg tablet, Simvastatin (Zocor) 40 mg Tablet, Atenolol (Tenormin) 25 mg tablet, and Gabapentin (Neurontin) 100 mg tablet.  Pt states she is out of the Gabapentin completely. She states pharmacy should be calling in to our office for the request.

## 2021-09-30 DIAGNOSIS — G629 Polyneuropathy, unspecified: Secondary | ICD-10-CM | POA: Diagnosis not present

## 2021-10-28 DIAGNOSIS — J029 Acute pharyngitis, unspecified: Secondary | ICD-10-CM | POA: Diagnosis not present

## 2021-10-28 DIAGNOSIS — G629 Polyneuropathy, unspecified: Secondary | ICD-10-CM | POA: Diagnosis not present

## 2021-11-01 ENCOUNTER — Ambulatory Visit (INDEPENDENT_AMBULATORY_CARE_PROVIDER_SITE_OTHER): Payer: Medicare Other | Admitting: Family Medicine

## 2021-11-01 ENCOUNTER — Encounter: Payer: Self-pay | Admitting: Family Medicine

## 2021-11-01 VITALS — BP 130/84 | HR 63 | Temp 97.1°F | Ht 67.0 in | Wt 232.4 lb

## 2021-11-01 DIAGNOSIS — J069 Acute upper respiratory infection, unspecified: Secondary | ICD-10-CM | POA: Diagnosis not present

## 2021-11-01 MED ORDER — ALBUTEROL SULFATE HFA 108 (90 BASE) MCG/ACT IN AERS: 2.0000 | INHALATION_SPRAY | Freq: Four times a day (QID) | RESPIRATORY_TRACT | 0 refills | Status: DC | PRN

## 2021-11-01 MED ORDER — GUAIFENESIN-CODEINE 100-10 MG/5ML PO SOLN: 5.0000 mL | Freq: Three times a day (TID) | ORAL | 0 refills | Status: DC | PRN

## 2021-11-01 MED ORDER — AZITHROMYCIN 250 MG PO TABS: ORAL_TABLET | ORAL | 0 refills | Status: DC

## 2021-11-01 NOTE — Patient Instructions (Signed)
Meds have been sent the the pharmacy °You can take tylenol for pain/fevers °If worsening symptoms, let us know or go to the Emergency room  ° ° °

## 2021-11-01 NOTE — Progress Notes (Signed)
? ?Subjective:  ? ? ? Patient ID: Rachel Gates, female    DOB: October 24, 1955, 66 y.o.   MRN: [id number] ? ?Chief Complaint  ?Patient presents with  ? Sore Throat  ?  Tested for Strep on Monday, negative ?Taking Delsym, ibuprofen ?  ? Nasal Congestion  ?  Started Sunday   ? Cough  ? Wheezing  ? Ear Fullness  ?  bilateral  ? ? ?HPI ?Chief complaint: congested ?Symptom onset: 3/19 ?Pertinent positives: congesstion, coughing sore throat, wheezing, ear fullness. ?Pertinent negatives: tested neg for strep on 3/20-[facility] for wt loss.  No f/c.  Had diarrhea 1 day only ?Treatments tried: OTC ?Vaccine status: all boosters utd ?Sick exposure: none  ? ?There are no preventive care reminders to display for this patient. ? ?Past Medical History:  ?Diagnosis Date  ? Allergy   ? Anxiety   ? Depression   ? Hyperlipidemia   ? Hypertension   ? Obese   ? ? ?Past Surgical History:  ?Procedure Laterality Date  ? ABDOMINAL HYSTERECTOMY    ? carpal tunnel surgery bilaterally    ? CHOLECYSTECTOMY    ? left foot fascia release    ? parotid gland surgery right side    ? ? ?Outpatient Medications Prior to Visit  ?Medication Sig Dispense Refill  ? aspirin 81 MG EC tablet Take 81 mg by mouth daily.    ? aspirin-acetaminophen-caffeine (EXCEDRIN MIGRAINE) 250-250-65 MG tablet Take by mouth every 6 (six) hours as needed for headache.    ? atenolol (TENORMIN) 25 MG tablet TAKE ONE TABLET BY MOUTH DAILY; MUST CALL MD FOR APPOINTMENT FOR FURTHER REFILLS 90 tablet 3  ? azelastine (ASTELIN) 0.1 % nasal spray Place 2 sprays into both nostrils 2 (two) times daily. Use in each nostril as directed 30 mL 12  ? calcium carbonate (OS-CAL) 600 MG TABS Take 600 mg by mouth 2 (two) times daily with a meal.    ? citalopram (CELEXA) 10 MG tablet Take 1 tablet (10 mg total) by mouth daily. 90 tablet 2  ? cyclobenzaprine (FLEXERIL) 5 MG tablet Take 1 tablet (5 mg total) by mouth 3 (three) times daily as needed for muscle spasms (do not drive for 8 hours after  taking). 30 tablet 1  ? fexofenadine (ALLEGRA) 60 MG tablet Take 60 mg by mouth 2 (two) times daily.    ? fish oil-omega-3 fatty acids 1000 MG capsule Take 1 g by mouth daily.    ? FLUZONE HIGH-DOSE QUADRIVALENT 0.7 ML SUSY     ? gabapentin (NEURONTIN) 100 MG capsule Take 1 capsule (100 mg total) by mouth at bedtime as needed. 90 capsule 3  ? GEMTESA 75 MG TABS Take 1 tablet by mouth daily.    ? MODERNA COVID-19 BIVAL BOOSTER 50 MCG/0.5ML injection     ? Multiple Vitamin (MULTIVITAMIN) tablet Take 1 tablet by mouth daily.    ? phentermine 37.5 MG capsule Take 37.5 mg by mouth every morning.    ? simvastatin (ZOCOR) 40 MG tablet Take 1 tablet (40 mg total) by mouth at bedtime. 90 tablet 3  ? vitamin B-12 (CYANOCOBALAMIN) 1000 MCG tablet Take 2,000 mcg by mouth daily.    ? azithromycin (ZITHROMAX) 250 MG tablet Take 2 tabs day 1, then 1 tab daily (Patient not taking: Reported on 11/01/2021) 6 each 0  ? guaiFENesin-codeine 100-10 MG/5ML syrup Take 5 mLs by mouth 3 (three) times daily as needed for cough. (Patient not taking: Reported on 11/01/2021) 120 mL 0  ? ?  No facility-administered medications prior to visit.  ? ? ?Allergies  ?Allergen Reactions  ? Prednisone Other (See Comments)  ?  Pt does not tolerate oral prednisone-causes severe migraine.   Injections only  ? ?ROS neg/noncontributory except as noted HPI/below ? ? ?   ?Objective:  ?  ? ?BP 130/84   Pulse 63   Temp (!) 97.1 ?F (36.2 ?C) (Temporal)   Ht '5\' 7"'$  (1.702 m)   Wt 232 lb 6 oz (105.4 kg)   LMP  (LMP Unknown)   SpO2 99%   BMI 36.40 kg/m?  ?Wt Readings from Last 3 Encounters:  ?11/01/21 232 lb 6 oz (105.4 kg)  ?06/25/21 230 lb 12.8 oz (104.7 kg)  ?05/23/21 229 lb 12.8 oz (104.2 kg)  ? ? ?Physical Exam  ? ?Gen: WDWN NAD OWF ?HEENT: NCAT, conjunctiva not injected, sclera nonicteric ?TM WNL B, OP moist, no exudates.  Wax in R ?NECK:  supple, no thyromegaly, no nodes, no carotid bruits ?CARDIAC: RRR, S1S2+, no murmur. DP 2+B ?LUNGS: CTAB. Occ wheeze end  exp.  Good ae ?EXT:  no edema ?MSK: no gross abnormalities.  ?NEURO: A&O x3.  CN II-XII intact.  ?PSYCH: normal mood. Good eye contact ? ?   ?Assessment & Plan:  ? ?Problem List Items Addressed This Visit   ?None ?Visit Diagnoses   ? ? Acute upper respiratory infection    -  Primary  ? ?  ? URI-zpk to hold.  Cough syrup and albuterol.     ? ?No orders of the defined types were placed in this encounter. ? ? ?Wellington Hampshire, MD ? ?

## 2021-11-28 DIAGNOSIS — G479 Sleep disorder, unspecified: Secondary | ICD-10-CM | POA: Diagnosis not present

## 2021-11-28 DIAGNOSIS — Z79899 Other long term (current) drug therapy: Secondary | ICD-10-CM | POA: Diagnosis not present

## 2021-11-28 DIAGNOSIS — G629 Polyneuropathy, unspecified: Secondary | ICD-10-CM | POA: Diagnosis not present

## 2021-12-18 ENCOUNTER — Telehealth: Payer: Self-pay | Admitting: Family Medicine

## 2021-12-18 NOTE — Telephone Encounter (Signed)
Pt states a severe pain inside knee/at the top of the calf. Cannot walk and has to stand still to wait for it to subside before she can put weight on the knee. Frequency was rare, but has increased greatly in the past few days. ? ?Referred to Triage Nurse ?

## 2021-12-19 NOTE — Telephone Encounter (Signed)
VML for patient to call and sch an apt with provider-  ? ? ?patient ?Name: ?Rachel JO ?Gates ?Gender: Female ?DOB: [birth year] ?Age: 66 Y 2 M 13 D ?Return ?Phone ?Number: ?[phone] ?(Primary) ?Address: ?City/ ?State/ ?Zip: [place] ?[address] ?Client [facility] - ?Client ?[facility]er, [facility] ?Provider Garret Reddish- MD ?Contact Type Call ?Who Is Calling Patient / Member / Family / Caregiver ?Call Type Triage / Clinical ?Relationship To Patient Self ?Return Phone Number [phone] (Primary) ?Chief Complaint Leg Pain ?Reason for Call Symptomatic / Request for Health Information ?Initial Comment Caller states patient is complaining of severe pain ?behind her knee and she says when she stands on ?it she cant walk and she said its really frequent and ?now she is concerned. ?Translation No ?Nurse Assessment ?Nurse: Hassell Done, RN, Melanie Date/Time Eilene Ghazi Time): 12/18/2021 2:39:05 PM ?Confirm and document reason for call. If ?symptomatic, describe symptoms. ?---Caller states she has pain behind her knee. she has ?put some voltaren on and gets a little relief. ?Does the patient have any new or worsening ?symptoms? ---Yes ?Will a triage be completed? ---Yes ?Related visit to physician within the last 2 weeks? ---No ?Does the PT have any chronic conditions? (i.e. ?diabetes, asthma, this includes High risk factors for ?pregnancy, etc.) ?---No ?Is this a behavioral health or substance abuse call? ---No ?Guidelines ?Guideline Title Affirmed Question Affirmed Notes Nurse Date/Time (Eastern ?Time) ?Knee Pain [1] Thigh or calf pain ?AND [2] only 1 side ?AND [3] present > 1 ?hour ?Hassell Done, RN, Melanie 12/18/2021 2:40:10 ?PM ?Disp. Time (Eastern ?Time) Disposition Final User ?PLEASE NOTE: All timestamps contained within this report are represented as [place] Standard Time. ?CONFIDENTIALTY NOTICE: This fax transmission is intended only for the addressee. It contains  information that is legally privileged, confidential or ?otherwise protected from use or disclosure. If you are not the intended recipient, you are strictly prohibited from reviewing, disclosing, copying using ?or disseminating any of this information or taking any action in reliance on or regarding this information. If you have received this fax in error, please ?notify us immediately by telephone so that we can arrange for its return to [place]. Phone: [phone], Toll-Free: [phone], Fax: [phone] ?Page: 2 of 2 ?Call Id: [id number] ?12/18/2021 2:43:47 PM See HCP within 4 Hours (or ?PCP triage) ?Yes Hassell Done, RN, Threasa Beards ?Caller Disagree/Comply Comply ?Caller Understands Yes ?PreDisposition Call Doctor ?Care Advice Given Per Guideline ?SEE HCP (OR PCP TRIAGE) WITHIN 4 HOURS: CARE ADVICE given per Knee Pain (Adult) guideline. ?Referrals ?REFERRED TO PCP OFFICE ?

## 2021-12-30 ENCOUNTER — Ambulatory Visit (INDEPENDENT_AMBULATORY_CARE_PROVIDER_SITE_OTHER): Payer: Medicare Other | Admitting: Family Medicine

## 2021-12-30 ENCOUNTER — Encounter: Payer: Self-pay | Admitting: Family Medicine

## 2021-12-30 VITALS — BP 132/72 | HR 65 | Temp 97.7°F | Ht 67.0 in | Wt 230.8 lb

## 2021-12-30 DIAGNOSIS — M79661 Pain in right lower leg: Secondary | ICD-10-CM | POA: Diagnosis not present

## 2021-12-30 DIAGNOSIS — I10 Essential (primary) hypertension: Secondary | ICD-10-CM

## 2021-12-30 DIAGNOSIS — G609 Hereditary and idiopathic neuropathy, unspecified: Secondary | ICD-10-CM

## 2021-12-30 MED ORDER — GABAPENTIN 100 MG PO CAPS: 100.0000 mg | ORAL_CAPSULE | Freq: Three times a day (TID) | ORAL | 3 refills | Status: DC

## 2021-12-30 NOTE — Patient Instructions (Addendum)
Stat scan of leg to be on safe side but I suspect this is neuropathy related. What I mean by that is the numbness/tingling/change in situation likely changes likely slightly changes your gait and makes you stress other portions of your leg/calf  Lets try gabapentin 100 up to three times a day- start with twice daily for first one or two weeks and increase to three times a day after that if not too sleepy  Recommended follow up: Return for as needed for new, worsening, persistent symptoms.

## 2021-12-30 NOTE — Progress Notes (Signed)
Phone [phone] In person visit   Subjective:   Rachel Gates is a 66 y.o. year old very pleasant female patient who presents for/with See problem oriented charting Chief Complaint  Patient presents with   neuropathy and calf pain    Pt c/o right calf pain and neuropathy in bilateral feet with the right being the worse. She has noticed this for about 3 months.    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 8 g 0   aspirin 81 MG EC tablet Take 81 mg by mouth daily.     aspirin-acetaminophen-caffeine (EXCEDRIN MIGRAINE) 250-250-65 MG tablet Take by mouth every 6 (six) hours as needed for headache.     atenolol (TENORMIN) 25 MG tablet TAKE ONE TABLET BY MOUTH DAILY; MUST CALL MD FOR APPOINTMENT FOR FURTHER REFILLS 90 tablet 3   azelastine (ASTELIN) 0.1 % nasal spray Place 2 sprays into both nostrils 2 (two) times daily. Use in each nostril as directed 30 mL 12   calcium carbonate (OS-CAL) 600 MG TABS Take 600 mg by mouth 2 (two) times daily with a meal.     citalopram (CELEXA) 10 MG tablet Take 1 tablet (10 mg total) by mouth daily. 90  tablet 2   cyclobenzaprine (FLEXERIL) 5 MG tablet Take 1 tablet (5 mg total) by mouth 3 (three) times daily as needed for muscle spasms (do not drive for 8 hours after taking). 30 tablet 1   fexofenadine (ALLEGRA) 60 MG tablet Take 60 mg by mouth 2 (two) times daily.     fish oil-omega-3 fatty acids 1000 MG capsule Take 1 g by mouth daily.     gabapentin (NEURONTIN) 100 MG capsule Take 1 capsule (100 mg total) by mouth 3 (three) times daily. 270 capsule 3   GEMTESA 75 MG TABS Take 1 tablet by mouth daily.     Multiple Vitamin (MULTIVITAMIN) tablet Take 1 tablet by mouth daily.     phentermine 37.5 MG capsule Take 37.5 mg by mouth every morning.     simvastatin (ZOCOR) 40 MG tablet Take 1 tablet (40 mg total) by mouth at bedtime. 90 tablet 3   vitamin B-12 (CYANOCOBALAMIN) 1000 MCG tablet Take 2,000 mcg by mouth daily.     No current facility-administered medications for this visit.     Objective:  BP 132/72   Pulse 65   Temp 97.7 F (36.5 C)   Ht '5\' 7"'$  (1.702 m)   Wt 230 lb 12.8 oz (104.7 kg)   LMP  (LMP Unknown)   SpO2  95%   BMI 36.15 kg/m  Gen: NAD, resting comfortably CV: RRR no murmurs rubs or gallops Lungs: CTAB no crackles, wheeze, rhonchi Abdomen: soft/nontender/nondistended/normal bowel sounds. No rebound or guarding.  Ext: stbae trace edema. Right lower calf pain noted.     Assessment and Plan   #social update- tragic loss of daughter in April at age 30. She is in grief counseling. Was stil dealing with loss of parents in 2021 - [place] trip in October with sister that was already planned  # right calf pain #potential neuropathy with ongoing numbness/tingling in both legs as well as pain in toes S: Patient complains of right calf pain for about 2 months (has tried heat and ice with temporary reief). Similar pain in past  but nothing as persistent. If sits for a while and stands- hard to put weight on the leg. Has had similar pain in the calves before at times. No knee  pain. No increased eg sweing   2 months of pain in both feet along with numbness and tingling which she attributes to neuropathy- gets pains in first 2 toes like toothache. She started gabapentin October 2022 with '100mg'$  at bedtime. Helped some at first but then symptoms worsened. Sleeps well so at [place] [facility] at night  From cpe note "#numbness/tingling-no obvious cause on labs last few years including normal B12, TSH, A1c.   -continues to have some numbness/tingling in feet. Feels like an internal itch and then will eventually subside. Can keep her up at night. Particularly toes and in both feet.   -since nighttime is worst will trial low dose gabapentin '100mg'$  before bed - prior swelling is better" A1c showed prediabtes  Also notes numbness/tingling in bilateral feet but feels like it is worse on the right.  Symptoms present for at least 3 months.  Looking back over chart on 11/07/2019 she presented to Dr. Rogers Blocker with numbness and tingling of left lower extremity-tested negative for DVT at that time.  Thought to be calf strain.  Also had TSH, B12, CBC, CMP checked at that time.  Was ultimately referred to sports medicine- pain got better and did not.   A/P: Stat scan of leg to be on safe side but I suspect this is neuropathy related. What I mean by that is the numbness/tingling/change in situation likely changes likely slightly changes your gait and makes you stress other portions of your leg/calf  Lets try gabapentin 100 up to three times a day- start with twice daily for first one or two weeks and increase to three times a day after that if not too sleepy  Listing this formally as idiopathic neuropathy- offered neuroogy consult but she declines for now   #hypertension S: medication: atenolol 25 mg BP Readings from Last 3 Encounters:  12/30/21 132/72  11/01/21 130/84  06/25/21 130/82  A/P: Controlled. Continue current medications.    Recommended follow up: Return for as needed for new,  worsening, persistent symptoms. Future Appointments  Date Time Provider [place]  12/31/2021 11:00 AM [facility]  06/13/2022 11:00 AM Marin Olp, MD [facility]   Lab/Order associations:   ICD-10-CM   1. Right calf pain  M79.661 VAS [place] LOWER EXTREMITY VENOUS (DVT)    2. Idiopathic neuropathy  G60.9     3. Essential hypertension  I10      Meds ordered this encounter  Medications   gabapentin (NEURONTIN) 100 MG capsule    Sig: Take 1 capsule (100 mg  total) by mouth 3 (three) times daily.    Dispense:  270 capsule    Refill:  3   Return precautions advised.  Garret Reddish, MD

## 2021-12-30 NOTE — Assessment & Plan Note (Signed)
#   right calf pain #potential neuropathy with ongoing numbness/tingling in both legs as well as pain in toes S: Patient complains of right calf pain for about 2 months (has tried heat and ice with temporary reief). Similar pain in past  but nothing as persistent. If sits for a while and stands- hard to put weight on the leg. Has had similar pain in the calves before at times. No knee pain. No increased eg sweing   2 months of pain in both feet along with numbness and tingling which she attributes to neuropathy- gets pains in first 2 toes like toothache. She started gabapentin October 2022 with '100mg'$  at bedtime. Helped some at first but then symptoms worsened. Sleeps well so at [place] [facility] at night  From cpe note "#numbness/tingling-no obvious cause on labs last few years including normal B12, TSH, A1c.   -continues to have some numbness/tingling in feet. Feels like an internal itch and then will eventually subside. Can keep her up at night. Particularly toes and in both feet.   -since nighttime is worst will trial low dose gabapentin '100mg'$  before bed - prior swelling is better" A1c showed prediabtes  Also notes numbness/tingling in bilateral feet but feels like it is worse on the right.  Symptoms present for at least 3 months.  Looking back over chart on 11/07/2019 she presented to Dr. Rogers Blocker with numbness and tingling of left lower extremity-tested negative for DVT at that time.  Thought to be calf strain.  Also had TSH, B12, CBC, CMP checked at that time.  Was ultimately referred to sports medicine- pain got better and did not.   A/P: Stat scan of leg to be on safe side but I suspect this is neuropathy related. What I mean by that is the numbness/tingling/change in situation likely changes likely slightly changes your gait and makes you stress other portions of your leg/calf  Lets try gabapentin 100 up to three times a day- start with twice daily for first one or two weeks and increase to three times  a day after that if not too sleepy  Listing this formally as idiopathic neuropathy- offered neuroogy consult but she declines for now

## 2021-12-31 ENCOUNTER — Ambulatory Visit (HOSPITAL_COMMUNITY)
Admission: RE | Admit: 2021-12-31 | Discharge: 2021-12-31 | Disposition: A | Discharge status: Home / Self Care | Payer: Medicare Other | Source: Ambulatory Visit | Attending: Family Medicine | Admitting: Family Medicine

## 2021-12-31 DIAGNOSIS — M79661 Pain in right lower leg: Secondary | ICD-10-CM | POA: Diagnosis not present

## 2021-12-31 NOTE — Progress Notes (Signed)
Bilateral lower extremity venous duplex has been completed. Preliminary results can be found in CV Proc through chart review.  Results were given to [place] PA.  12/31/21 10:47 AM Rachel Gates RVT

## 2022-03-05 ENCOUNTER — Encounter: Payer: Self-pay | Admitting: Family Medicine

## 2022-03-07 NOTE — Telephone Encounter (Signed)
No appt offered.

## 2022-03-11 ENCOUNTER — Ambulatory Visit (INDEPENDENT_AMBULATORY_CARE_PROVIDER_SITE_OTHER): Payer: Medicare Other | Admitting: Family Medicine

## 2022-03-11 ENCOUNTER — Encounter: Payer: Self-pay | Admitting: Family Medicine

## 2022-03-11 VITALS — BP 138/76 | HR 66 | Temp 97.9°F | Ht 67.0 in | Wt 243.4 lb

## 2022-03-11 DIAGNOSIS — F3342 Major depressive disorder, recurrent, in full remission: Secondary | ICD-10-CM | POA: Diagnosis not present

## 2022-03-11 DIAGNOSIS — I10 Essential (primary) hypertension: Secondary | ICD-10-CM

## 2022-03-11 DIAGNOSIS — M5441 Lumbago with sciatica, right side: Secondary | ICD-10-CM | POA: Diagnosis not present

## 2022-03-11 DIAGNOSIS — G8929 Other chronic pain: Secondary | ICD-10-CM | POA: Diagnosis not present

## 2022-03-11 DIAGNOSIS — E785 Hyperlipidemia, unspecified: Secondary | ICD-10-CM

## 2022-03-11 MED ORDER — METHYLPREDNISOLONE ACETATE 80 MG/ML IJ SUSP
80.0000 mg | Freq: Once | INTRAMUSCULAR | Status: AC
  Administered 2022-03-11: 80 mg via INTRAMUSCULAR

## 2022-03-11 MED ORDER — ATENOLOL 25 MG PO TABS
ORAL_TABLET | ORAL | 3 refills | Status: DC
Start: 2022-03-11 — End: 2022-07-02

## 2022-03-11 NOTE — Patient Instructions (Addendum)
Flu shot- we should have these available within a month or two but please let us know if you get at outside pharmacy.  We will call you within two weeks about your referral to [facility]. If you do not hear within 2 weeks, give [place] a call.    Depo medrol '80mg'$  injection today  Recommended follow up: Return for as needed for new, worsening, persistent symptoms.

## 2022-03-11 NOTE — Progress Notes (Signed)
Phone [phone] In person visit   Subjective:   Rachel Gates is a 66 y.o. year old very pleasant female patient who presents for/with See problem oriented charting Chief Complaint  Patient presents with   foot neuropathy    Pt c/o bilateral lower neuropathy that is getting worse along with sciatic nerve pain.   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 8 g 0   aspirin 81 MG EC tablet Take 81 mg by mouth daily.     aspirin-acetaminophen-caffeine (EXCEDRIN MIGRAINE) 250-250-65 MG tablet Take by mouth every 6 (six) hours as needed for headache.     azelastine (ASTELIN) 0.1 % nasal spray Place 2 sprays into both nostrils 2 (two) times daily. Use in each nostril as directed 30 mL 12   calcium carbonate (OS-CAL) 600 MG TABS Take 600 mg by mouth 2 (two) times daily with a meal.     citalopram (CELEXA) 10 MG tablet Take 1 tablet (10 mg total) by mouth daily. 90 tablet 2   fexofenadine (ALLEGRA) 60 MG tablet Take 60 mg by mouth 2 (two) times daily.     fish oil-omega-3 fatty acids 1000 MG capsule Take 1 g by mouth daily.     GEMTESA 75 MG TABS  Take 1 tablet by mouth daily.     Multiple Vitamin (MULTIVITAMIN) tablet Take 1 tablet by mouth daily.     simvastatin (ZOCOR) 40 MG tablet Take 1 tablet (40 mg total) by mouth at bedtime. 90 tablet 3   vitamin B-12 (CYANOCOBALAMIN) 1000 MCG tablet Take 2,000 mcg by mouth daily.     atenolol (TENORMIN) 25 MG tablet TAKE ONE TABLET BY MOUTH DAILY; 90 tablet 3   No current facility-administered medications for this visit.     Objective:  BP 138/76   Pulse 66   Temp 97.9 F (36.6 C)   Ht '5\' 7"'$  (1.702 m)   Wt 243 lb 6.4 oz (110.4 kg)   LMP  (LMP Unknown)   SpO2 95%   BMI 38.12 kg/m  Gen: NAD, resting comfortably CV: RRR no murmurs rubs or gallops Lungs: CTAB no crackles, wheeze, rhonchi Abdomen: soft/nontender/nondistended/normal bowel sounds. No rebound or guarding.  Ext: no edema Skin: warm, dry Back - Normal skin, Spine with normal alignment and no deformity.  Lumbar tenderness to vertebral process palpation.  Paraspinous muscles are not tender and without spasm.   Range of motion is full at neck. Negative Straight leg raise.  Normal hip range of motion.  Neuro- no saddle anesthesia, 5/5 strength lower  extremities    Assessment and Plan   # Right low back pain with sciatica of right side #Neuropathy S: Patient complains of bilateral lower leg pain seems to be getting worse but more so on the right - Also complains of what she believes to be sciatic nerve pain-describes as pain from right low back into buttocks and down back of right leg  -Last imaging of the low back was 2005 with lumbar spine complete films-mild lumbar scoliosis and degenerative changes particularly in the facet joints  Pain was noted at 12/30/2021 visit in the right calf and at that point had already been going on for about 2 months-venous duplex negative for DVT-scan due to more intense pain on the right calf.  If she sits for period of time then goes to stand feels like it is hard to pull the weight on the  right leg.  Also has pain in both feet along with numbness and tingling which she attributes to neuropathy.  Had been started on gabapentin May 30 2021 '100mg'$  at bedtime-at last visit we increased to 3 times a day and offered neurology consult but she wanted to hold off at that time  Pain did not improve on gabapentin '100mg'$  TID and in fact has been worsening in both feet. Also having right low back pain now and radiating into the right buttocks and back of upper leg- still having the prior calf pain. Numbness tingling worse on right side. Pain up to 8/10 and worse at night- also with activity can get up to 9/10 such as trying to walk. Sitting feels better. Has to avoid laying on right side due to pain at night. Leaning forward may help the pain some at a store.  - rotating tylenol and ibuprofen '800mg'$  (warned of BP and cardiac risks and kidney risks)  ROS-No saddle anesthesia, no new bladder incontinence, fecal incontinence, weakness in extremity- other than due to pain,. History negative for trauma, history of cancer, fever, chills, unintentional weight loss, recent bacterial infection, recent IV drug use, HIV.   A/P: Right low back pain with sciatica of the right side-pain seems to improve with leaning forward on a shopping cart and does not worsen in seated position-I told her I thought spinal stenosis was certainly possible.  Straight leg raise negative-think bulging disc slightly less likely. - Discussed potentially doing lumbar films (possibly helpful given midline pain) versus MRI - Also offered orthopedic referral-she prefers to start with [facility] and obtain imaging with their office - Also discussed potential prednisone trial but she does not tolerate oral prednisone-we opted to trial Depo-Medrol 80 mg injection which she has tolerated in the past  # Depression S: Medication: Celexa 10 mg    03/11/2022    3:56 PM 11/01/2021   11:32 AM 05/27/2021    2:36 PM  Depression screen PHQ 2/9   Decreased Interest 0 0 0  Down, Depressed, Hopeless 0 0 0  PHQ - 2 Score 0 0 0  Altered sleeping 2 0   Tired, decreased energy 0 0   Change in appetite 0 0   Feeling bad or failure about yourself  0 0   Trouble concentrating 0 0   Moving slowly or fidgety/restless 0 0   Suicidal thoughts 0 0   PHQ-9 Score 2 0   Difficult doing work/chores Not difficult at all Not difficult at all   A/P: Thankfully even with significant pain patient's depression remains in full remission-continue current medication per her preference  #  hypertension S: medication: Atenolol 25 mg BP Readings from Last 3 Encounters:  03/11/22 138/76  12/30/21 132/72  11/01/21 130/84  A/P: Blood pressure running high normal but she is not tolerating pain today-we opted to continue current dose of medicine given reasonable control   Recommended follow up: Return for as needed for new, worsening, persistent symptoms. Future Appointments  Date Time Provider [place]  06/13/2022 11:00 AM Marin Olp, MD [facility]    Lab/Order associations:   ICD-10-CM   1. Chronic right-sided low back pain with right-sided sciatica  M54.41 Ambulatory referral to Orthopedic Surgery   G89.29 methylPREDNISolone acetate (DEPO-MEDROL) injection 80 mg    2. Hyperlipidemia, unspecified hyperlipidemia type  E78.5 atenolol (TENORMIN) 25 MG tablet    3. Essential hypertension  I10     4. Recurrent major depressive disorder, in full remission ([place])  F33.42       Meds ordered this encounter  Medications   atenolol (TENORMIN) 25 MG tablet    Sig: TAKE ONE TABLET BY MOUTH DAILY;    Dispense:  90 tablet    Refill:  3   methylPREDNISolone acetate (DEPO-MEDROL) injection 80 mg    Return precautions advised.  Garret Reddish, MD

## 2022-03-12 DIAGNOSIS — H43813 Vitreous degeneration, bilateral: Secondary | ICD-10-CM | POA: Diagnosis not present

## 2022-03-23 ENCOUNTER — Encounter: Payer: Self-pay | Admitting: Family Medicine

## 2022-04-07 ENCOUNTER — Other Ambulatory Visit: Payer: Self-pay | Admitting: Family Medicine

## 2022-04-07 DIAGNOSIS — F3342 Major depressive disorder, recurrent, in full remission: Secondary | ICD-10-CM

## 2022-04-10 DIAGNOSIS — M5136 Other intervertebral disc degeneration, lumbar region: Secondary | ICD-10-CM | POA: Diagnosis not present

## 2022-04-10 DIAGNOSIS — M4186 Other forms of scoliosis, lumbar region: Secondary | ICD-10-CM | POA: Diagnosis not present

## 2022-04-10 DIAGNOSIS — M545 Low back pain, unspecified: Secondary | ICD-10-CM | POA: Diagnosis not present

## 2022-04-16 ENCOUNTER — Encounter: Payer: Self-pay | Admitting: Family Medicine

## 2022-04-26 ENCOUNTER — Encounter: Payer: Self-pay | Admitting: Family Medicine

## 2022-05-01 DIAGNOSIS — Z1231 Encounter for screening mammogram for malignant neoplasm of breast: Secondary | ICD-10-CM | POA: Diagnosis not present

## 2022-05-28 ENCOUNTER — Telehealth: Payer: Self-pay | Admitting: Family Medicine

## 2022-05-28 NOTE — Telephone Encounter (Signed)
Spouse stated that patient was out of [place] until 06/2022 req CB then.

## 2022-06-13 ENCOUNTER — Encounter: Payer: Self-pay | Admitting: Family Medicine

## 2022-06-13 ENCOUNTER — Ambulatory Visit (INDEPENDENT_AMBULATORY_CARE_PROVIDER_SITE_OTHER): Payer: Medicare Other | Admitting: Family Medicine

## 2022-06-13 ENCOUNTER — Telehealth: Payer: Self-pay | Admitting: Family Medicine

## 2022-06-13 VITALS — BP 138/84 | HR 67 | Temp 97.8°F | Ht 67.0 in | Wt 245.2 lb

## 2022-06-13 DIAGNOSIS — Z0000 Encounter for general adult medical examination without abnormal findings: Secondary | ICD-10-CM

## 2022-06-13 DIAGNOSIS — E785 Hyperlipidemia, unspecified: Secondary | ICD-10-CM | POA: Diagnosis not present

## 2022-06-13 DIAGNOSIS — R739 Hyperglycemia, unspecified: Secondary | ICD-10-CM

## 2022-06-13 LAB — COMPREHENSIVE METABOLIC PANEL
ALT: 17 U/L (ref 0–35)
AST: 22 U/L (ref 0–37)
Albumin: 4.2 g/dL (ref 3.5–5.2)
Alkaline Phosphatase: 60 U/L (ref 39–117)
BUN: 18 mg/dL (ref 6–23)
CO2: 28 mEq/L (ref 19–32)
Calcium: 9.8 mg/dL (ref 8.4–10.5)
Chloride: 103 mEq/L (ref 96–112)
Creatinine, Ser: 0.58 mg/dL (ref 0.40–1.20)
GFR: 93.47 mL/min (ref >60.00)
Glucose, Bld: 101 mg/dL — ABNORMAL HIGH (ref 70–99)
Potassium: 4 mEq/L (ref 3.5–5.1)
Sodium: 140 mEq/L (ref 135–145)
Total Bilirubin: 0.5 mg/dL (ref 0.2–1.2)
Total Protein: 6.8 g/dL (ref 6.0–8.3)

## 2022-06-13 LAB — CBC WITH DIFFERENTIAL/PLATELET
Basophils Absolute: 0.1 10*3/uL (ref 0.0–0.1)
Basophils Relative: 0.7 % (ref 0.0–3.0)
Eosinophils Absolute: 0.3 10*3/uL (ref 0.0–0.7)
Eosinophils Relative: 4.2 % (ref 0.0–5.0)
HCT: 43.3 % (ref 36.0–46.0)
Hemoglobin: 14.5 g/dL (ref 12.0–15.0)
Lymphocytes Relative: 32.8 % (ref 12.0–46.0)
Lymphs Abs: 2.4 10*3/uL (ref 0.7–4.0)
MCHC: 33.4 g/dL (ref 30.0–36.0)
MCV: 94.6 fl (ref 78.0–100.0)
Monocytes Absolute: 0.8 10*3/uL (ref 0.1–1.0)
Monocytes Relative: 10.4 % (ref 3.0–12.0)
Neutro Abs: 3.8 10*3/uL (ref 1.4–7.7)
Neutrophils Relative %: 51.9 % (ref 43.0–77.0)
Platelets: 231 10*3/uL (ref 150.0–400.0)
RBC: 4.57 Mil/uL (ref 3.87–5.11)
RDW: 13.9 % (ref 11.5–15.5)
WBC: 7.3 10*3/uL (ref 4.0–10.5)

## 2022-06-13 LAB — LIPID PANEL
Cholesterol: 154 mg/dL (ref 0–200)
HDL: 37.1 mg/dL — ABNORMAL LOW (ref >39.00)
NonHDL: 117.07
Total CHOL/HDL Ratio: 4
Triglycerides: 239 mg/dL — ABNORMAL HIGH (ref 0.0–149.0)
VLDL: 47.8 mg/dL — ABNORMAL HIGH (ref 0.0–40.0)

## 2022-06-13 LAB — LDL CHOLESTEROL, DIRECT: Direct LDL: 88 mg/dL

## 2022-06-13 LAB — TSH: TSH: 1.57 u[IU]/mL (ref 0.35–5.50)

## 2022-06-13 LAB — HEMOGLOBIN A1C: Hgb A1c MFr Bld: 6 % (ref 4.6–6.5)

## 2022-06-13 NOTE — Progress Notes (Signed)
Phone [phone]   Subjective:  Patient presents today for their annual physical. Chief complaint-noted.   See problem oriented charting- ROS- full  review of systems was completed and negative except for: back pain- did better after steroid shot though- less radicular pain  The following were reviewed and entered/updated in epic: Past Medical History:  Diagnosis Date   Allergy    Anxiety    Depression    Hyperlipidemia    Hypertension    Obese    Patient Active Problem List   Diagnosis Date Noted   Hyperglycemia 06/13/2022    Priority: Medium    Idiopathic neuropathy 12/30/2021    Priority: Medium    Migraine headache with aura 10/03/2014    Priority: Medium    Essential hypertension 07/03/2008    Priority: Medium    Hyperlipidemia 01/08/2007    Priority: Medium    Depression 01/08/2007    Priority: Medium    History of total hysterectomy 05/06/2018    Priority: Low   Hip bursitis 05/12/2016    Priority: Low   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 died 05-22-2020   Heart attack Father        mid 71s. died at 41   Glaucoma Father     Medications- reviewed and updated Current Outpatient Medications  Medication Sig Dispense Refill   albuterol (VENTOLIN HFA) 108 (90 Base) MCG/ACT inhaler Inhale 2 puffs into the lungs every 6 (six) hours as needed for wheezing or shortness of breath. 8 g 0   aspirin 81 MG EC tablet  Take 81 mg by mouth daily.     aspirin-acetaminophen-caffeine (EXCEDRIN MIGRAINE) 250-250-65 MG tablet Take by mouth every 6 (six) hours as needed for headache.     atenolol (TENORMIN) 25 MG tablet TAKE ONE TABLET BY MOUTH DAILY; 90 tablet 3   azelastine (ASTELIN) 0.1 % nasal spray Place 2 sprays into both nostrils 2 (two) times daily. Use in each nostril as directed 30 mL 12   calcium carbonate (OS-CAL) 600 MG TABS Take 600 mg by mouth 2 (two) times daily with a meal.     citalopram (CELEXA) 10 MG tablet TAKE 1 TABLET BY MOUTH DAILY 90 tablet 3   fexofenadine (ALLEGRA) 60 MG tablet Take 60 mg by mouth 2 (two) times daily.     fish oil-omega-3 fatty acids 1000 MG capsule Take 1 g by mouth daily.     GEMTESA 75 MG TABS Take 1 tablet by mouth daily.     Multiple Vitamin (MULTIVITAMIN) tablet Take 1 tablet by mouth daily.     simvastatin (ZOCOR) 40 MG tablet Take 1 tablet (40 mg total) by mouth at bedtime. 90 tablet 3   vitamin B-12 (CYANOCOBALAMIN) 1000 MCG tablet Take 2,000 mcg  by mouth daily.     No current facility-administered medications for this visit.    Allergies-reviewed and updated Allergies  Allergen Reactions   Prednisone Other (See Comments)    Pt does not tolerate oral prednisone-causes severe migraine, body on fire, heart racing- Injections only    Social History   Social History Narrative   Lives with her husband. Their adult children live nearby- 2 living children, 1  daughter died tragically in 2021/10/23 (did not have kids). 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 Oropharynx normal Neck: no thyromegaly CV: RRR no murmurs rubs or gallops Lungs: CTAB no crackles, wheeze, rhonchi Abdomen: soft/nontender/nondistended/normal bowel sounds. No rebound or guarding.  Ext: no edema Skin: warm, dry Neuro: grossly normal, moves all extremities, PERRLA   Assessment and Plan   66 y.o. female presenting for annual physical.  Health Maintenance counseling: 1. Anticipatory guidance: Patient counseled regarding regular dental exams -q6 months, eye exams - yearly,  avoiding smoking and second hand smoke, limiting alcohol to 1 beverage per day-1 drink a year , no illicit drugs.   2. Risk factor reduction:  Advised patient of need for regular exercise and diet rich and fruits and vegetables to reduce risk of heart attack and stroke.  Exercise- not walking recently - pain issues with back/sciatica have been barrier.  Diet/weight management-up 16 pounds from last year but tragically lost her daughter in the spring- plans to restart efforts.  -Also was on phentermine with Dr. Lennice Sites with [facility] last year- off of that but wants to start back (needs to watch BP closely)   Wt Readings from Last 3 Encounters:  06/13/22 245 lb 3.2 oz (111.2 kg)  03/11/22 243 lb 6.4 oz (110.4 kg)  12/30/21 230 lb 12.8 oz (104.7 kg)  3. Immunizations/screenings/ancillary studies-discussed COVID-19 vaccination- had this, discussed RSV-already had, already had flu shot, prevnar 20 done Immunization History  Administered Date(s) Administered   Fluad Quad(high Dose 65+) 05/21/2020, 05/06/2021   Hep A / Hep B 04/23/2022   Influenza Split 04/15/2012   Influenza Whole 08/16/2009   Influenza, High Dose Seasonal PF 05/06/2021, 04/21/2022   Influenza,inj,Quad PF,6+ Mos 05/08/2014, 05/22/2015, 05/12/2016, 04/30/2017, 05/06/2018, 05/11/2019   Influenza,inj,quad, With Preservative 04/11/2018   Moderna Covid-19 Vaccine Bivalent  Booster 75yr & up 05/06/2021   Moderna SARS-COV2 Booster Vaccination 05/07/2022   PFIZER(Purple Top)SARS-COV-2 Vaccination 10/15/2019, 11/05/2019, 06/22/2020   PNEUMOCOCCAL CONJUGATE-20 04/23/2022   Pneumococcal Conjugate-13 05/22/2015   Pneumococcal Polysaccharide-23 05/21/2020   Respiratory Syncytial Virus Vaccine,Recomb Aduvanted(Arexvy) 04/21/2022   Td 01/02/2004   Tdap 05/05/2013   Unspecified SARS-COV-2 Vaccination 05/06/2021   Zoster Recombinat (Shingrix) 08/08/2017, 10/23/2017  4. Cervical cancer screening- history of hysterectomy including cervix- still has ovaries.  Follows with Dr. TGertie Feywho decides on Pap smears-technically past age based screening requirements.   5. Breast cancer screening-  breast exam with  gynecology and mammogram  at that time as well-on file 04/19/2021-also had this year  will get ROI 6. Colon cancer screening - 2019 at [facility] with 10-year follow-up planned  7. Skin cancer screening-Dr. [place] Crane practice Jun 21, 2021- saw [employer], [place]. advised regular sunscreen use. Denies worrisome, changing, or new skin lesions- other than a few skin lesions on face - these appear to be seborrheic keratosis  8. Birth control/STD check- hysterectomy/monogamous   9. Osteoporosis screening at 65-6/14/21 and ws told repeat in a year- was told good. Notes from Dr. Gaetano Net mention 5 year repeat10. Smoking associated screening -never smoker-no screening needed  Status of chronic or acute concerns   #social update- had a wonderful cruise with whole family- 3/4 of the ship got sick  #Right low back/Sciatica issues- working with [facility] on this- some home PT- plans to follow up   #hypertension S: medication: Atenolol 25 mg Home readings #s: even better at home high 120s/high 70s BP Readings from Last 3 Encounters:  06/13/22 138/84  03/11/22 138/76  12/30/21 132/72  A/P: Controlled. Continue current medications.    #hyperlipidemia S: Medication: fish  oil , simvastatin 40 mg Lab Results  Component Value Date   CHOL 168 05/23/2021   HDL 41.00 05/23/2021   LDLCALC 112 (H) 05/21/2020   LDLDIRECT 104.0 05/23/2021   TRIG 274.0 (H) 05/23/2021   CHOLHDL 4 05/23/2021   A/P: we did not adjust dose last year- wanted to focus on lifestyle changes- she still wants to work on this so we do not increase prediabetes risk with stronger statin   # Depression S: Medication:Citalopram 10 mg    06/13/2022   11:10 AM 03/11/2022    3:56 PM 11/01/2021   11:32 AM  Depression screen PHQ 2/9  Decreased Interest 0 0 0  Down, Depressed, Hopeless 0 0 0  PHQ - 2 Score 0 0 0  Altered sleeping 0 2 0  Tired, decreased energy 0 0 0  Change in appetite 0 0 0  Feeling bad or failure about yourself  0 0 0  Trouble concentrating 0 0 0  Moving slowly or fidgety/restless 0 0 0  Suicidal thoughts 0 0 0  PHQ-9 Score 0 2 0  Difficult doing work/chores Not difficult at all Not difficult at all Not difficult at all  A/P: full remission- continue current meds   #Overactive bladder-on Gemtesa through urology Dr. McDiarmid- helping a lot    # Hyperglycemia/insulin resistance/prediabetes S:  Medication: none -did have dose of prednisone x1 and steroid shot 03/11/22- could raise sugar slightly plus just went on cruise Lab Results  Component Value Date   HGBA1C 5.8 06/19/2021   HGBA1C 5.9 05/23/2021   HGBA1C 5.6 05/21/2020   A/P: hopefully stable- update a1c today. Continue current meds for now  Recommended follow up: Return in about 6 months (around 12/12/2022) for followup or sooner if needed.Schedule b4 you leave. - at latest 1 year for physical    Lab/Order associations: fasting   ICD-10-CM   1. Preventative health care  Z00.00     2. Hyperlipidemia, unspecified hyperlipidemia type  E78.5 CBC with Differential/Platelet    Comprehensive metabolic panel    Lipid panel    TSH    3. Hyperglycemia  R73.9 HgB A1c      No orders of the defined types were placed in  this encounter.   Return precautions advised.  Garret Reddish, MD

## 2022-06-13 NOTE — Patient Instructions (Addendum)
Health Maintenance Due  Topic Date Due   Medicare Annual Wellness (AWV)  05/27/2022  You are eligible to schedule your annual wellness visit with our nurse specialist Otila Kluver.  Please consider scheduling this before you leave today  Sign release of information at the check out desk for 2023 mammogram  Thanks for doing labs If you have mychart- we will send your results within 3 business days of [place] receiving them.  If you do not have mychart- we will call you about results within 5 business days of [place] receiving them.  *please also note that you will see labs on mychart as soon as they post. I will later go in and write notes on them- will say "notes from Dr. Yong Channel"   Recommended follow up: Return in about 6 months (around 12/12/2022) for followup or sooner if needed.Schedule b4 you leave. - at latest 1 year for physical

## 2022-06-13 NOTE — Telephone Encounter (Signed)
Spoke with patient spouse he stated she was busy with new grandchild and req CB end of 06/2022

## 2022-07-01 ENCOUNTER — Other Ambulatory Visit: Payer: Self-pay | Admitting: Family Medicine

## 2022-07-01 DIAGNOSIS — E785 Hyperlipidemia, unspecified: Secondary | ICD-10-CM

## 2022-07-02 ENCOUNTER — Other Ambulatory Visit: Payer: Self-pay | Admitting: Family Medicine

## 2022-07-02 DIAGNOSIS — E785 Hyperlipidemia, unspecified: Secondary | ICD-10-CM

## 2022-07-09 DIAGNOSIS — R600 Localized edema: Secondary | ICD-10-CM | POA: Diagnosis not present

## 2022-07-09 DIAGNOSIS — M545 Low back pain, unspecified: Secondary | ICD-10-CM | POA: Diagnosis not present

## 2022-07-09 DIAGNOSIS — M4186 Other forms of scoliosis, lumbar region: Secondary | ICD-10-CM | POA: Diagnosis not present

## 2022-07-27 DIAGNOSIS — M545 Low back pain, unspecified: Secondary | ICD-10-CM | POA: Diagnosis not present

## 2022-07-31 ENCOUNTER — Encounter: Payer: Self-pay | Admitting: Family Medicine

## 2022-07-31 ENCOUNTER — Ambulatory Visit
Admission: EM | Admit: 2022-07-31 | Discharge: 2022-07-31 | Disposition: A | Discharge status: Home / Self Care | Payer: Medicare Other | Attending: Family Medicine | Admitting: Family Medicine

## 2022-07-31 DIAGNOSIS — R509 Fever, unspecified: Secondary | ICD-10-CM

## 2022-07-31 DIAGNOSIS — J069 Acute upper respiratory infection, unspecified: Secondary | ICD-10-CM

## 2022-07-31 DIAGNOSIS — R52 Pain, unspecified: Secondary | ICD-10-CM | POA: Diagnosis not present

## 2022-07-31 MED ORDER — PROMETHAZINE-DM 6.25-15 MG/5ML PO SYRP: 5.0000 mL | ORAL_SOLUTION | Freq: Four times a day (QID) | ORAL | 0 refills | Status: DC | PRN

## 2022-07-31 MED ORDER — OSELTAMIVIR PHOSPHATE 75 MG PO CAPS: 75.0000 mg | ORAL_CAPSULE | Freq: Two times a day (BID) | ORAL | 0 refills | Status: DC

## 2022-07-31 NOTE — ED Triage Notes (Signed)
Pt reports lightheadedness, congestion, scratchy throat, coughing at night x 2 days. Took tylenol cold and flu and tylenol. Had a fever of 101.

## 2022-07-31 NOTE — ED Provider Notes (Signed)
[facility]    CSN: [id number] Arrival date & time: 07/31/22  1325      History   Chief Complaint No chief complaint on file.   HPI Rachel Gates is a 66 y.o. female.   Presenting today with 2-day history of lightheadedness, congestion, scratchy throat, cough, fever, chills, body aches.  Denies chest pain, shortness of breath, abdominal pain, nausea vomiting or diarrhea.  So far taking Tylenol Cold and flu with mild temporary relief of symptoms.  No known sick contacts recently.    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      Home Medications    Prior to Admission medications   Medication Sig Start Date End Date Taking? Authorizing Provider  oseltamivir (TAMIFLU) 75 MG capsule Take 1 capsule (75 mg total) by mouth every 12 (twelve) hours. 07/31/22  Yes Volney American, PA-C  promethazine-dextromethorphan (PROMETHAZINE-DM) 6.25-15 MG/5ML syrup Take 5 mLs by mouth 4 (four) times daily as needed. 07/31/22  Yes Volney American, PA-C  albuterol (VENTOLIN HFA) 108 (90 Base) MCG/ACT inhaler Inhale 2 puffs into the lungs every 6 (six) hours as needed for wheezing or shortness of breath.  11/01/21   Tawnya Crook, MD  aspirin 81 MG EC tablet Take 81 mg by mouth daily.    [provider]  aspirin-acetaminophen-caffeine (EXCEDRIN MIGRAINE) [phone] MG tablet Take by mouth every 6 (six) hours as needed for headache.    [provider]  atenolol (TENORMIN) 25 MG tablet TAKE 1 TABLET BY MOUTH DAILY 07/02/22   Marin Olp, MD  azelastine (ASTELIN) 0.1 % nasal spray Place 2 sprays into both nostrils 2 (two) times daily. Use in each nostril as directed 08/08/21   Vivi Barrack, MD  calcium carbonate (OS-CAL) 600 MG TABS Take 600 mg by mouth 2 (two) times daily with a meal.    [provider]  citalopram (CELEXA) 10 MG tablet TAKE 1 TABLET BY MOUTH DAILY 04/08/22   Marin Olp, MD  fexofenadine (ALLEGRA) 60 MG tablet Take 60 mg by mouth 2 (two) times daily.    [provider]  fish oil-omega-3 fatty acids 1000 MG capsule Take 1 g by mouth daily.    [provider]  GEMTESA 75 MG TABS Take 1 tablet by mouth daily. 09/26/21   [provider]  Multiple Vitamin (MULTIVITAMIN) tablet Take 1 tablet by mouth daily.    [provider]  simvastatin (ZOCOR) 40 MG tablet TAKE 1 TABLET BY MOUTH AT  BEDTIME 07/02/22   Marin Olp, MD  vitamin B-12 (CYANOCOBALAMIN) 1000 MCG  tablet Take 2,000 mcg by mouth daily.    [provider]    Family History Family History  Problem Relation Age of Onset   Hypertension Mother    Angina Mother        no stents   Uterine cancer Mother    Alzheimer's disease Mother        in facility since Nov 19, 2014. died 05-21-2020   Heart attack Father        mid 76s. died at 51   Glaucoma Father     Social History Social History   Tobacco Use   Smoking status: Never   Smokeless tobacco: Never  Substance Use Topics   Alcohol use: No   Drug use: No     Allergies   Prednisone   Review of Systems Review of Systems Per HPI  Physical Exam Triage Vital Signs ED Triage  Vitals  Enc Vitals Group     BP 07/31/22 1504 91/60     Pulse Rate 07/31/22 1504 75     Resp 07/31/22 1504 20     Temp 07/31/22 1504 99 F (37.2 C)     Temp Source 07/31/22 1504 Oral     SpO2 07/31/22 1504 92 %     Weight --      Height --      Head Circumference --      Peak Flow --      Pain Score 07/31/22 1508 0     Pain Loc --      Pain Edu? --      Excl. in [place]? --    No data found.  Updated Vital Signs BP 91/60 (BP Location: Right Arm)   Pulse 75   Temp 99 F (37.2 C) (Oral)   Resp 20   LMP  (LMP Unknown)   SpO2 92%   Visual Acuity Right Eye Distance:   Left Eye Distance:   Bilateral Distance:    Right Eye Near:   Left Eye Near:    Bilateral Near:     Physical Exam Vitals and nursing note reviewed.  Constitutional:      Appearance: Normal appearance.  HENT:     Head: Atraumatic.     Right Ear: Tympanic membrane and external ear normal.     Left Ear: Tympanic membrane and external ear normal.     Nose: Rhinorrhea present.     Mouth/Throat:     Mouth: Mucous membranes are moist.     Pharynx: Posterior oropharyngeal erythema present.  Eyes:     Extraocular Movements: Extraocular movements intact.     Conjunctiva/sclera: Conjunctivae normal.  Cardiovascular:     Rate and Rhythm: Normal rate and regular rhythm.     Heart sounds: Normal heart sounds.  Pulmonary:     Effort: Pulmonary effort is normal.     Breath sounds: Normal breath sounds. No wheezing.  Musculoskeletal:        General: Normal range of motion.     Cervical back: Normal range of motion and neck supple.  Skin:    General: Skin is warm and dry.  Neurological:     Mental Status: She is alert and oriented to person, place, and time.  Psychiatric:        Mood and Affect: Mood normal.        Thought Content: Thought content normal.      UC Treatments / Results  Labs (all labs ordered are listed, but only abnormal results are displayed) Labs  Reviewed - No data to  display  EKG   Radiology No results found.  Procedures Procedures (including critical care time)  Medications Ordered in UC Medications - No data to display  Initial Impression / Assessment and Plan / UC Course  I have reviewed the triage vital signs and the nursing notes.  Pertinent labs & imaging results that were available during my care of the patient were reviewed by me and considered in my medical decision making (see chart for details).     Vitals and exam overall reassuring today, suggestive of a viral upper respiratory infection likely influenza.  Treat with Tamiflu, Phenergan DM, supportive over-the-counter medications and home care.  Return for worsening symptoms.  Final Clinical Impressions(s) / UC Diagnoses   Final diagnoses:  Viral URI with cough  Fever, unspecified  Generalized body aches   Discharge Instructions   None    ED Prescriptions     Medication Sig Dispense Auth. Provider   oseltamivir (TAMIFLU) 75 MG capsule Take 1 capsule (75 mg total) by mouth every 12 (twelve) hours. 10 capsule Volney American, [place]   promethazine-dextromethorphan (PROMETHAZINE-DM) 6.25-15 MG/5ML syrup Take 5 mLs by mouth 4 (four) times daily as needed. 100 mL Volney American, [place]      PDMP not reviewed this encounter.   Volney American, [place] 07/31/22 1539

## 2022-08-01 ENCOUNTER — Ambulatory Visit: Payer: Medicare Other | Admitting: Family Medicine

## 2022-08-12 ENCOUNTER — Ambulatory Visit (INDEPENDENT_AMBULATORY_CARE_PROVIDER_SITE_OTHER): Payer: Medicare Other

## 2022-08-12 VITALS — Wt 245.0 lb

## 2022-08-12 DIAGNOSIS — Z0000 Encounter for general adult medical examination without abnormal findings: Secondary | ICD-10-CM

## 2022-08-12 NOTE — Progress Notes (Addendum)
I connected with  Rachel Gates on 08/12/22 by a audio enabled telemedicine application and verified that I am speaking with the correct person using two identifiers.  Patient Location: Home  Provider Location: Office/Clinic  I discussed the limitations of evaluation and management by telemedicine. The patient expressed understanding and agreed to proceed.   Subjective:   Rachel Gates is a 67 y.o. female who presents for Medicare Annual (Subsequent) preventive examination.  Review of Systems     Cardiac Risk Factors include: advanced age (>29mn, >>8women);hypertension;dyslipidemia;obesity (BMI >30kg/m2)     Objective:    Today's Vitals   08/12/22 1519  Weight: 245 lb (111.1 kg)   Body mass index is 38.37 kg/m.     08/12/2022    3:25 PM 05/27/2021    2:37 PM  Advanced Directives  Does Patient Have a Medical Advance Directive? Yes Yes  Type of AP[place]amed[place]of [place]   Does patient want to make changes to medical advance directive?  Yes (MAU/Ambulatory/Procedural Areas - Information given)  Copy of HH[place]in Chart? No - copy requested     Current Medications (verified) Outpatient Encounter Medications as of 08/12/2022  Medication Sig   albuterol (VENTOLIN HFA) 108 (90 Base) MCG/ACT inhaler Inhale 2 puffs into the lungs every 6 (six) hours as needed for wheezing or shortness of breath.   aspirin 81 MG EC tablet Take 81 mg by mouth daily.   aspirin-acetaminophen-caffeine (EXCEDRIN MIGRAINE) 250-250-65 MG tablet Take by mouth every 6 (six) hours as needed for headache.   atenolol (TENORMIN) 25 MG tablet TAKE 1 TABLET BY MOUTH DAILY   calcium carbonate (OS-CAL) 600 MG TABS Take 600 mg by mouth 2 (two) times daily with a meal.   citalopram (CELEXA) 10 MG tablet TAKE 1 TABLET BY MOUTH DAILY   fexofenadine (ALLEGRA) 60 MG tablet Take 60 mg by mouth 2 (two) times daily.   fish oil-omega-3 fatty acids 1000 MG capsule Take 1 g  by mouth daily.   GEMTESA 75 MG TABS Take 1 tablet by mouth daily.   Multiple Vitamin (MULTIVITAMIN) tablet Take 1 tablet by mouth daily.   simvastatin (ZOCOR) 40 MG tablet TAKE 1 TABLET BY MOUTH AT  BEDTIME   vitamin B-12 (CYANOCOBALAMIN) 1000 MCG tablet Take 2,000 mcg by mouth daily.   [DISCONTINUED] azelastine (ASTELIN) 0.1 % nasal spray Place 2 sprays into both nostrils 2 (two) times daily. Use in each nostril as directed   [DISCONTINUED] oseltamivir (TAMIFLU) 75 MG capsule Take 1 capsule (75 mg total) by mouth every 12 (twelve) hours.   [DISCONTINUED] promethazine-dextromethorphan (PROMETHAZINE-DM) 6.25-15 MG/5ML syrup Take 5 mLs by mouth 4 (four) times daily as needed.   No facility-administered encounter medications on file as of 08/12/2022.    Allergies (verified) Prednisone   History: Past Medical History:  Diagnosis Date   Allergy    Anxiety    Depression    Hyperlipidemia    Hypertension    Obese    Past Surgical History:  Procedure Laterality Date   ABDOMINAL HYSTERECTOMY     carpal tunnel surgery bilaterally     CHOLECYSTECTOMY     left foot fascia release     parotid gland surgery right side     Family History  Problem Relation Age of Onset   Hypertension Mother    Angina Mother        no stents   Uterine cancer Mother    Alzheimer's disease Mother  in facility since 11-13-2014. died sept 2021   Heart attack Father        mid 5s. died at 35   Glaucoma Father    Social History   Socioeconomic History   Marital status: Married    Spouse name: [name]   Number of children: 3   Years of education: 12+   Highest education level: Not on file  Occupational History   Occupation: Solicitor: [employer]  Tobacco Use   Smoking status: Never   Smokeless tobacco: Never  Substance and Sexual Activity   Alcohol use: No   Drug use: No   Sexual activity: Not on file  Other Topics Concern   Not on file  Social History Narrative   Lives  with her husband. Their adult children live nearby- 2 living children, 1  daughter died tragically in 2021-11-12 (did not have kids). 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- lost them in 2019/11/13 as well      Hobbies: caretaker role takes a lot of her time, paint, cross stitch, gardenening, reading    Social Determinants of Health   Financial Resource Strain: Low Risk  (05/27/2021)   Overall Financial Resource Strain (CARDIA)    Difficulty of Paying Living Expenses: Not hard at all  Food Insecurity: No Food Insecurity (08/12/2022)   Hunger Vital Sign    Worried About Running Out of Food in the Last Year: Never true    [place] in the Last Year: Never true  Transportation Needs: No Transportation Needs (08/12/2022)   PRAPARE - Hydrologist (Medical): No    Lack of Transportation (Non-Medical): No  Physical Activity: Sufficiently Active (08/12/2022)   Exercise Vital Sign    Days of Exercise per Week: 3 days    Minutes of Exercise per Session: 60 min  Stress: No Stress Concern Present (08/12/2022)   [place]    Feeling of Stress : Not at all  Social Connections: Moderately Integrated (08/12/2022)   Social Connection and Isolation Panel [NHANES]    Frequency of Communication with Friends and Family: More than three times a week    Frequency of Social Gatherings with Friends and Family: More than three times a week    Attends Religious Services: 1 to 4 times per year    Active Member of [employer] or Organizations: No    Attends Music therapist: Never    Marital Status: Married    Tobacco Counseling Counseling given: Not Answered   Clinical Intake:  Pre-visit preparation completed: Yes  Pain : No/denies pain     BMI - recorded: 38.37 Nutritional Status: BMI > 30   Obese Nutritional Risks: None Diabetes: No  How often do you need to have someone help you when you read instructions, pamphlets, or other written materials from your doctor or pharmacy?: 1 - Never  Diabetic?no  Interpreter Needed?: No  Information entered by :: Charlott Rakes, LPN   Activities of Daily Living    08/12/2022    3:26 PM  In your present state of health, do you have any difficulty performing the following activities:  Hearing? 0  Vision? 0  Difficulty concentrating or making decisions? 0  Walking or climbing stairs? 0  Dressing or bathing? 0  Doing  errands, shopping? 0  Preparing Food and eating ? N  Using the Toilet? N  In the past six months, have you accidently leaked urine? N  Do you have problems with loss of bowel control? N  Managing your Medications? N  Managing your Finances? N  Housekeeping or managing your Housekeeping? N    Patient Care Team: Marin Olp, MD as PCP - General (Family Medicine)  Indicate any recent Medical Services you may have received from other than [facility] providers in the past year (date may be approximate).     Assessment:   This is a routine wellness examination for Madhavi.  Hearing/Vision screen Hearing Screening - Comments:: Pt denies any hearing issues  Vision Screening - Comments:: Pt follows up with [facility] for annual eye exams   Dietary issues and exercise activities discussed: Current Exercise Habits: Home exercise routine, Type of exercise: walking   Goals Addressed             This Visit's Progress    Patient Stated       Weight program losing 50 lbs       Depression Screen    08/12/2022    3:23 PM 06/13/2022   11:10 AM 03/11/2022    3:56 PM 11/01/2021   11:32 AM 05/27/2021    2:36 PM 05/23/2021    8:16 AM 05/21/2020    9:12 AM  PHQ 2/9 Scores  PHQ - 2 Score 0 0 0 0 0 0 0  PHQ- 9 Score 0 0 2 0  0 0    Fall Risk    08/12/2022    3:26 PM 11/01/2021   11:33 AM 05/27/2021    3:02 PM  05/23/2021    8:16 AM 05/21/2020    9:39 AM  [place] in the past year? 0 0 0 0 0  Number falls in past yr: 0 0 0 0 0  Injury with Fall? 0 0 0 0 0  Risk for fall due to : Impaired vision  Impaired vision History of fall(s)   Follow up Falls prevention discussed  Falls prevention discussed Falls evaluation completed     FALL RISK PREVENTION PERTAINING TO THE HOME:  Any stairs in or around the home? Yes  If so, are there any without handrails? Yes  Home free of loose throw rugs in walkways, pet beds, electrical cords, etc? Yes  Adequate lighting in your home to reduce risk of falls? Yes   ASSISTIVE DEVICES UTILIZED TO PREVENT FALLS:  Life alert? No  Use of a cane, walker or w/c? No  Grab bars in the bathroom? Yes  Shower chair or bench in shower? No  Elevated toilet seat or a handicapped toilet? No   TIMED UP AND GO:  Was the test performed? No .   Cognitive Function:        08/12/2022    3:29 PM 05/27/2021    3:03 PM  6CIT Screen  What Year? 0 points 0 points  What month? 0 points 0 points  What time? 0 points 0 points  Count back from 20 0 points 0 points  Months in reverse 0 points 0 points  Repeat phrase 0 points 0 points  Total Score 0 points 0 points    Immunizations Immunization History  Administered Date(s) Administered   Fluad Quad(high Dose 65+) 05/21/2020, 05/06/2021   Hep A / Hep B 04/23/2022   Influenza Split 04/15/2012   Influenza Whole 08/16/2009   Influenza,  High Dose Seasonal PF 05/06/2021, 04/21/2022   Influenza,inj,Quad PF,6+ Mos 05/08/2014, 05/22/2015, 05/12/2016, 04/30/2017, 05/06/2018, 05/11/2019   Influenza,inj,quad, With Preservative 04/11/2018   Moderna Covid-19 Vaccine Bivalent Booster 63yr & up 05/06/2021   Moderna SARS-COV2 Booster Vaccination 05/07/2022   PFIZER(Purple Top)SARS-COV-2 Vaccination 10/15/2019, 11/05/2019, 06/22/2020   PNEUMOCOCCAL CONJUGATE-20 04/23/2022   Pneumococcal Conjugate-13 05/22/2015   Pneumococcal  Polysaccharide-23 05/21/2020   Respiratory Syncytial Virus Vaccine,Recomb Aduvanted(Arexvy) 04/21/2022   Td 01/02/2004   Tdap 05/05/2013   Unspecified SARS-COV-2 Vaccination 05/06/2021   Zoster Recombinat (Shingrix) 08/08/2017, 10/23/2017    TDAP status: Up to date  Flu Vaccine status: Up to date  Pneumococcal vaccine status: Up to date  Covid-19 vaccine status: Completed vaccines  Qualifies for Shingles Vaccine? Yes   Zostavax completed Yes   Shingrix Completed?: Yes  Screening Tests Health Maintenance  Topic Date Due   COVID-19 Vaccine (5 - 2023-24 season) 07/02/2022   Hepatitis C Screening  08/15/2098 (Originally 10/07/1973)   MAMMOGRAM  04/30/2023   DTaP/Tdap/Td (3 - Td or Tdap) 05/06/2023   Medicare Annual Wellness (AWV)  08/13/2023   COLONOSCOPY (Pts [id number] coverage will need to be confirmed)  10/17/2026   Pneumonia Vaccine 6544Years old  Completed   INFLUENZA VACCINE  Completed   DEXA SCAN  Completed   Zoster Vaccines- Shingrix  Completed   HPV VACCINES  Aged Out    Health Maintenance  Health Maintenance Due  Topic Date Due   COVID-19 Vaccine (5 - 2023-24 season) 07/02/2022    Colorectal cancer screening: Type of screening: Colonoscopy. Completed 10/16/16. Repeat every 10 years  Mammogram status: Completed 04/29/21. Repeat every year  Bone density 01/23/20  Additional Screening:  Hepatitis C Screening: does qualify  Vision Screening: Recommended annual ophthalmology exams for early detection of glaucoma and other disorders of the eye. Is the patient up to date with their annual eye exam?  Yes  Who is the provider or what is the name of the office in which the patient [facility]  If pt is not established with a provider, would they like to be referred to a provider to establish care? No .   Dental Screening: Recommended annual dental exams for proper oral hygiene  Community Resource Referral / Chronic Care  Management: CRR required this visit?  No   CCM required this visit?  No      Plan:     I have personally reviewed and noted the following in the patient's chart:   Medical and social history Use of alcohol, tobacco or illicit drugs  Current medications and supplements including opioid prescriptions. Patient is not currently taking opioid prescriptions. Functional ability and status Nutritional status Physical activity Advanced directives List of other physicians Hospitalizations, surgeries, and ER visits in previous 12 months Vitals Screenings to include cognitive, depression, and falls Referrals and appointments  In addition, I have reviewed and discussed with patient certain preventive protocols, quality metrics, and best practice recommendations. A written personalized care plan for preventive services as well as general preventive health recommendations were provided to patient.     TiWillette BraceLPN   1/10/12/74 Nurse Notes: none

## 2022-08-12 NOTE — Patient Instructions (Signed)
Rachel Gates , Thank you for taking time to come for your Medicare Wellness Visit. I appreciate your ongoing commitment to your health goals. Please review the following plan we discussed and let me know if I can assist you in the future.   These are the goals we discussed:  Goals      Patient Stated     Getting A1C down     Patient Stated     Weight program losing 50 lbs        This is a list of the screening recommended for you and due dates:  Health Maintenance  Topic Date Due   COVID-19 Vaccine (5 - 2023-24 season) 07/02/2022   Hepatitis C Screening: USPSTF Recommendation to screen - Ages 18-79 yo.  08/15/2098*   Mammogram  04/30/2023   DTaP/Tdap/Td vaccine (3 - Td or Tdap) 05/06/2023   Medicare Annual Wellness Visit  08/13/2023   Colon Cancer Screening  10/17/2026   Pneumonia Vaccine  Completed   Flu Shot  Completed   DEXA scan (bone density measurement)  Completed   Zoster (Shingles) Vaccine  Completed   HPV Vaccine  Aged Out  *Topic was postponed. The date shown is not the original due date.    Advanced directives: Please bring a copy of your health care power of attorney and living will to the office at your convenience.  Conditions/risks identified: working with weight program to lose 50lbs   Next appointment: Follow up in one year for your annual wellness visit    Preventive Care 65 Years and Older, Female Preventive care refers to lifestyle choices and visits with your health care provider that can promote health and wellness. What does preventive care include? A yearly physical exam. This is also called an annual well check. Dental exams once or twice a year. Routine eye exams. Ask your health care provider how often you should have your eyes checked. Personal lifestyle choices, including: Daily care of your teeth and gums. Regular physical activity. Eating a healthy diet. Avoiding tobacco and drug use. Limiting alcohol use. Practicing safe sex. Taking  low-dose aspirin every day. Taking vitamin and mineral supplements as recommended by your health care provider. What happens during an annual well check? The services and screenings done by your health care provider during your annual well check will depend on your age, overall health, lifestyle risk factors, and family history of disease. Counseling  Your health care provider may ask you questions about your: Alcohol use. Tobacco use. Drug use. Emotional well-being. Home and relationship well-being. Sexual activity. Eating habits. History of falls. Memory and ability to understand (cognition). Work and work Statistician. Reproductive health. Screening  You may have the following tests or measurements: Height, weight, and BMI. Blood pressure. Lipid and cholesterol levels. These may be checked every 5 years, or more frequently if you are over 81 years old. Skin check. Lung cancer screening. You may have this screening every year starting at age 32 if you have a 30-pack-year history of smoking and currently smoke or have quit within the past 15 years. Fecal occult blood test (FOBT) of the stool. You may have this test every year starting at age 80. Flexible sigmoidoscopy or colonoscopy. You may have a sigmoidoscopy every 5 years or a colonoscopy every 10 years starting at age 80. Hepatitis C blood test. Hepatitis B blood test. Sexually transmitted disease (STD) testing. Diabetes screening. This is done by checking your blood sugar (glucose) after you have not eaten for a  while (fasting). You may have this done every 1-3 years. Bone density scan. This is done to screen for osteoporosis. You may have this done starting at age 75. Mammogram. This may be done every 1-2 years. Talk to your health care provider about how often you should have regular mammograms. Talk with your health care provider about your test results, treatment options, and if necessary, the need for more tests. Vaccines   Your health care provider may recommend certain vaccines, such as: Influenza vaccine. This is recommended every year. Tetanus, diphtheria, and acellular pertussis (Tdap, Td) vaccine. You may need a Td booster every 10 years. Zoster vaccine. You may need this after age 67. Pneumococcal 13-valent conjugate (PCV13) vaccine. One dose is recommended after age 17. Pneumococcal polysaccharide (PPSV23) vaccine. One dose is recommended after age 15. Talk to your health care provider about which screenings and vaccines you need and how often you need them. This information is not intended to replace advice given to you by your health care provider. Make sure you discuss any questions you have with your health care provider. Document Released: 08/24/2015 Document Revised: 04/16/2016 Document Reviewed: 05/29/2015 Elsevier Interactive Patient Education  2017 [place] Prevention in the Home Falls can cause injuries. They can happen to people of all ages. There are many things you can do to make your home safe and to help prevent falls. What can I do on the outside of my home? Regularly fix the edges of walkways and driveways and fix any cracks. Remove anything that might make you trip as you walk through a door, such as a raised step or threshold. Trim any bushes or trees on the path to your home. Use bright outdoor lighting. Clear any walking paths of anything that might make someone trip, such as rocks or tools. Regularly check to see if handrails are loose or broken. Make sure that both sides of any steps have handrails. Any raised decks and porches should have guardrails on the edges. Have any leaves, snow, or ice cleared regularly. Use sand or salt on walking paths during winter. Clean up any spills in your garage right away. This includes oil or grease spills. What can I do in the bathroom? Use night lights. Install grab bars by the toilet and in the tub and shower. Do not use towel  bars as grab bars. Use non-skid mats or decals in the tub or shower. If you need to sit down in the shower, use a plastic, non-slip stool. Keep the floor dry. Clean up any water that spills on the floor as soon as it happens. Remove soap buildup in the tub or shower regularly. Attach bath mats securely with double-sided non-slip rug tape. Do not have throw rugs and other things on the floor that can make you trip. What can I do in the bedroom? Use night lights. Make sure that you have a light by your bed that is easy to reach. Do not use any sheets or blankets that are too big for your bed. They should not hang down onto the floor. Have a firm chair that has side arms. You can use this for support while you get dressed. Do not have throw rugs and other things on the floor that can make you trip. What can I do in the kitchen? Clean up any spills right away. Avoid walking on wet floors. Keep items that you use a lot in easy-to-reach places. If you need to reach something above you,  use a strong step stool that has a grab bar. Keep electrical cords out of the way. Do not use floor polish or wax that makes floors slippery. If you must use wax, use non-skid floor wax. Do not have throw rugs and other things on the floor that can make you trip. What can I do with my stairs? Do not leave any items on the stairs. Make sure that there are handrails on both sides of the stairs and use them. Fix handrails that are broken or loose. Make sure that handrails are as long as the stairways. Check any carpeting to make sure that it is firmly attached to the stairs. Fix any carpet that is loose or worn. Avoid having throw rugs at the top or bottom of the stairs. If you do have throw rugs, attach them to the floor with carpet tape. Make sure that you have a light switch at the top of the stairs and the bottom of the stairs. If you do not have them, ask someone to add them for you. What else can I do to help  prevent falls? Wear shoes that: Do not have high heels. Have rubber bottoms. Are comfortable and fit you well. Are closed at the toe. Do not wear sandals. If you use a stepladder: Make sure that it is fully opened. Do not climb a closed stepladder. Make sure that both sides of the stepladder are locked into place. Ask someone to hold it for you, if possible. Clearly mark and make sure that you can see: Any grab bars or handrails. First and last steps. Where the edge of each step is. Use tools that help you move around (mobility aids) if they are needed. These include: Canes. Walkers. Scooters. Crutches. Turn on the lights when you go into a dark area. Replace any light bulbs as soon as they burn out. Set up your furniture so you have a clear path. Avoid moving your furniture around. If any of your floors are uneven, fix them. If there are any pets around you, be aware of where they are. Review your medicines with your doctor. Some medicines can make you feel dizzy. This can increase your chance of falling. Ask your doctor what other things that you can do to help prevent falls. This information is not intended to replace advice given to you by your health care provider. Make sure you discuss any questions you have with your health care provider. Document Released: 05/24/2009 Document Revised: 01/03/2016 Document Reviewed: 09/01/2014 Elsevier Interactive Patient Education  2017 [employer].

## 2022-08-13 NOTE — Addendum Note (Signed)
Addended by: Willette Brace on: 08/13/2022 10:54 AM   Modules accepted: Level of Service

## 2022-08-18 DIAGNOSIS — M5416 Radiculopathy, lumbar region: Secondary | ICD-10-CM | POA: Diagnosis not present

## 2022-08-19 ENCOUNTER — Telehealth: Payer: Self-pay | Admitting: Family Medicine

## 2022-08-19 MED ORDER — LORAZEPAM 0.5 MG PO TABS: 0.5000 mg | ORAL_TABLET | Freq: Two times a day (BID) | ORAL | 0 refills | Status: DC | PRN

## 2022-08-19 NOTE — Telephone Encounter (Signed)
Traumatic loss of husband potentially to sepsis based on fever and fatigue before he went to bed that night with negative COVID test, no respiratory symptoms no chest pain or shortness of breath.  Offered condolences - Will send in some Ativan- she has had a dose of this in the past with some benefit for anxiety without side effects.  We reviewed precautions and dependence potential-hoping sparing use will be beneficial to her

## 2022-09-04 DIAGNOSIS — M5416 Radiculopathy, lumbar region: Secondary | ICD-10-CM | POA: Diagnosis not present

## 2022-09-18 DIAGNOSIS — M5416 Radiculopathy, lumbar region: Secondary | ICD-10-CM | POA: Diagnosis not present

## 2022-09-18 DIAGNOSIS — M4186 Other forms of scoliosis, lumbar region: Secondary | ICD-10-CM | POA: Diagnosis not present

## 2022-10-15 DIAGNOSIS — Z79899 Other long term (current) drug therapy: Secondary | ICD-10-CM | POA: Diagnosis not present

## 2022-11-17 DIAGNOSIS — I10 Essential (primary) hypertension: Secondary | ICD-10-CM | POA: Diagnosis not present

## 2022-11-17 DIAGNOSIS — Z131 Encounter for screening for diabetes mellitus: Secondary | ICD-10-CM | POA: Diagnosis not present

## 2022-11-17 DIAGNOSIS — E559 Vitamin D deficiency, unspecified: Secondary | ICD-10-CM | POA: Diagnosis not present

## 2022-11-17 DIAGNOSIS — R300 Dysuria: Secondary | ICD-10-CM | POA: Diagnosis not present

## 2022-11-17 DIAGNOSIS — R5383 Other fatigue: Secondary | ICD-10-CM | POA: Diagnosis not present

## 2022-11-17 DIAGNOSIS — E7800 Pure hypercholesterolemia, unspecified: Secondary | ICD-10-CM | POA: Diagnosis not present

## 2022-11-18 ENCOUNTER — Telehealth: Payer: Self-pay | Admitting: Family Medicine

## 2022-11-18 DIAGNOSIS — E875 Hyperkalemia: Secondary | ICD-10-CM

## 2022-11-18 NOTE — Telephone Encounter (Signed)
See below

## 2022-11-18 NOTE — Telephone Encounter (Signed)
Pt got some lab work done at a weight loss place and they told her that her potsssium was high, i was going to make an appt but she wants to make surei its not an emergency. 6.9. Please advise.

## 2022-11-18 NOTE — Telephone Encounter (Signed)
Isf he develops any of the following should seek care immediately- worsening muscle weakness, chest pain, shortness of breath, or palpitations.   With that being said anticipate lab error-especially since she is not on any medicines that should raise potassium other than possibly multivitamin-can recheck at [facility] lab under hyperkalemia

## 2022-11-19 NOTE — Telephone Encounter (Signed)
Called and spoke with pt and below message given, future lab ordered and pt will go have drawn at [facility].

## 2022-11-20 ENCOUNTER — Other Ambulatory Visit (INDEPENDENT_AMBULATORY_CARE_PROVIDER_SITE_OTHER): Payer: Medicare Other

## 2022-11-20 DIAGNOSIS — E875 Hyperkalemia: Secondary | ICD-10-CM

## 2022-11-20 LAB — COMPREHENSIVE METABOLIC PANEL
ALT: 14 U/L (ref 0–35)
AST: 17 U/L (ref 0–37)
Albumin: 4.6 g/dL (ref 3.5–5.2)
Alkaline Phosphatase: 65 U/L (ref 39–117)
BUN: 21 mg/dL (ref 6–23)
CO2: 29 mEq/L (ref 19–32)
Calcium: 10.3 mg/dL (ref 8.4–10.5)
Chloride: 105 mEq/L (ref 96–112)
Creatinine, Ser: 0.68 mg/dL (ref 0.40–1.20)
GFR: 89.68 mL/min (ref >60.00)
Glucose, Bld: 104 mg/dL — ABNORMAL HIGH (ref 70–99)
Potassium: 4 mEq/L (ref 3.5–5.1)
Sodium: 142 mEq/L (ref 135–145)
Total Bilirubin: 0.8 mg/dL (ref 0.2–1.2)
Total Protein: 7.2 g/dL (ref 6.0–8.3)

## 2022-12-17 DIAGNOSIS — E7800 Pure hypercholesterolemia, unspecified: Secondary | ICD-10-CM | POA: Diagnosis not present

## 2022-12-17 DIAGNOSIS — R030 Elevated blood-pressure reading, without diagnosis of hypertension: Secondary | ICD-10-CM | POA: Diagnosis not present

## 2022-12-17 DIAGNOSIS — E875 Hyperkalemia: Secondary | ICD-10-CM | POA: Diagnosis not present

## 2022-12-17 DIAGNOSIS — R7303 Prediabetes: Secondary | ICD-10-CM | POA: Diagnosis not present

## 2022-12-17 DIAGNOSIS — I10 Essential (primary) hypertension: Secondary | ICD-10-CM | POA: Diagnosis not present

## 2022-12-24 DIAGNOSIS — R350 Frequency of micturition: Secondary | ICD-10-CM | POA: Diagnosis not present

## 2023-01-16 ENCOUNTER — Encounter: Payer: Self-pay | Admitting: Family Medicine

## 2023-01-16 DIAGNOSIS — H6121 Impacted cerumen, right ear: Secondary | ICD-10-CM | POA: Diagnosis not present

## 2023-01-16 DIAGNOSIS — R42 Dizziness and giddiness: Secondary | ICD-10-CM | POA: Diagnosis not present

## 2023-01-17 DIAGNOSIS — M5416 Radiculopathy, lumbar region: Secondary | ICD-10-CM | POA: Diagnosis not present

## 2023-01-30 ENCOUNTER — Ambulatory Visit (INDEPENDENT_AMBULATORY_CARE_PROVIDER_SITE_OTHER): Payer: Medicare Other | Admitting: Family Medicine

## 2023-01-30 ENCOUNTER — Encounter: Payer: Self-pay | Admitting: Family Medicine

## 2023-01-30 VITALS — BP 138/84 | HR 75 | Ht 67.0 in | Wt 228.0 lb

## 2023-01-30 DIAGNOSIS — F3342 Major depressive disorder, recurrent, in full remission: Secondary | ICD-10-CM

## 2023-01-30 DIAGNOSIS — I10 Essential (primary) hypertension: Secondary | ICD-10-CM | POA: Diagnosis not present

## 2023-01-30 DIAGNOSIS — L74511 Primary focal hyperhidrosis, face: Secondary | ICD-10-CM | POA: Diagnosis not present

## 2023-01-30 MED ORDER — ALUMINUM CHLORIDE IN ALCOHOL 6.25 % EX SOLN: 1.0000 | Freq: Every evening | CUTANEOUS | 11 refills | Status: DC

## 2023-01-30 NOTE — Progress Notes (Signed)
Phone [phone] In person visit   Subjective:   Rachel Gates is a 67 y.o. year old very pleasant female patient who presents for/with See problem oriented charting Chief Complaint  Patient presents with   Excessive Sweating    RM 7,  present about a year, non-fasting, denies shortness of breath   Past Medical History-  Patient Active Problem List   Diagnosis Date Noted   Hyperglycemia 06/13/2022    Priority: Medium    Idiopathic neuropathy 12/30/2021    Priority: Medium    Migraine headache with aura 10/03/2014    Priority: Medium    Essential hypertension 07/03/2008    Priority: Medium    Hyperlipidemia 01/08/2007    Priority: Medium    Depression 01/08/2007    Priority: Medium    History of total hysterectomy 05/06/2018    Priority: Low   Hip bursitis 05/12/2016    Priority: Low   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 8 g 0   Aluminum Chloride in Alcohol 6.25 % SOLN Apply 1 Application topically at bedtime. 60 mL 11   aspirin 81 MG EC tablet Take 81 mg by mouth daily.     aspirin-acetaminophen-caffeine (EXCEDRIN MIGRAINE) 250-250-65 MG tablet Take by mouth every 6 (six) hours as needed for headache.     atenolol (TENORMIN) 25 MG tablet TAKE 1 TABLET BY MOUTH DAILY 90 tablet 3   calcium carbonate (OS-CAL) 600 MG TABS Take 600 mg by mouth 2 (two) times daily with a meal.     citalopram (CELEXA) 10 MG tablet TAKE 1 TABLET BY MOUTH DAILY 90 tablet 3   fexofenadine (ALLEGRA) 60 MG tablet Take 60 mg by mouth 2 (two) times daily.     fish oil-omega-3 fatty acids 1000 MG  capsule Take 1 g by mouth daily.     GEMTESA 75 MG TABS Take 1 tablet by mouth daily.     LORazepam (ATIVAN) 0.5 MG tablet Take 1 tablet (0.5 mg total) by mouth 2 (two) times daily as needed for anxiety (do not drive for 8 hours after use or use within 8 hours of tramadol). 30 tablet 0   Multiple Vitamin (MULTIVITAMIN) tablet Take 1 tablet by mouth daily.     simvastatin (ZOCOR) 40 MG tablet TAKE 1 TABLET BY MOUTH AT  BEDTIME 90 tablet 3   vitamin B-12 (CYANOCOBALAMIN) 1000 MCG tablet Take 2,000 mcg by mouth daily.     phentermine (ADIPEX-P) 37.5 MG tablet Take 37.5 mg by mouth daily before breakfast.     No current facility-administered medications for this visit.     Objective:  BP 138/84   Pulse 75   Ht 5\' 7"  (1.702 m)   Wt 228 lb (103.4 kg)   LMP  (LMP Unknown)   SpO2 95%   BMI 35.71 kg/m  Gen: NAD, resting comfortably CV: RRR no murmurs rubs or gallops Lungs: CTAB no crackles, wheeze, rhonchi Ext: trace edema Skin: warm, dry     Assessment and Plan   # excessive sweating S:patient reports year of excessive sweating for about  a year. signifiantly worsening in last 3 months- did lose husband in january. No drenching night sweats. No fever or chills with this.    Yesterday rolled trashcans out and watered some flowers - not strenuous- didn't feel shortness of breath- ended up still soaking in sweat on her head. Almost always her head. Drips down her back and into neck- very bothersome. Can be anytime of day or night. Usually occurs with simplest thing. Does not wake up with sweats. Does not have fever when has sweats -has been on phentermine for 3 months -gemtesa started a year ago -has been on citalopram long period of time- no recent change -on atenolol for years  Thyroid normal in November. Only prediabets, no diabetes - reports #s improved with home health visit from [facility] medicare A/P: primary focal hyperhidrosis craniofacial - started after gemtesa it appears but  helpful and she wants to contiue -worsened on phentermine but is losing weight on this and wants to maintain for now (blood pressure high acceptable) -citalopram can contribute but she wants to stay on this- has been on long before this started -she prefers to trial   # Depression S: Medication:citalopram 10 mg- plus has therapist support -huge losses in last year between husband, daughter- very challenging year     01/30/2023   11:23 AM 08/12/2022    3:23 PM 06/13/2022   11:10 AM  Depression screen PHQ 2/9  Decreased Interest 0 0 0  Down, Depressed, Hopeless 0 0 0  PHQ - 2 Score 0 0 0  Altered sleeping 0 0 0  Tired, decreased energy 0 0 0  Change in appetite 0 0 0  Feeling bad or failure about yourself  0 0 0  Trouble concentrating 0 0 0  Moving slowly or fidgety/restless 0 0 0  Suicidal thoughts 0 0 0  PHQ-9 Score 0 0 0  Difficult doing work/chores  Not difficult at all Not difficult at all  A/P: full remission despite all she faces- has considerable community of support and likely helps  #hypertension S: medication: atenolol 25 mg daily BP Readings from Last 3 Encounters:  01/30/23 138/84  07/31/22 91/60  06/13/22 138/84  A/P: high acceptable- hoping will trend down with weight loss or when she gets back off phentermine- continue to monitor and stay on current medications for now  Recommended follow up: Return in about 6 months (around 08/01/2023) for physical or sooner if needed.Schedule b4 you leave. Future Appointments  Date Time Provider Department Center  08/11/2023  8:20 AM Shelva Majestic, MD [facility]   Lab/Order associations:   ICD-10-CM   1. 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 Essential hypertension  I10     3. Recurrent major depressive disorder, in full remission (HCC)  F33.42       Meds ordered this encounter  Medications   Aluminum Chloride in Alcohol 6.25 % SOLN    Sig: Apply 1 Application topically at bedtime.    Dispense:  60  mL    Refill:  11    If any irritation please stop. Apply nightly until symptoms slow then gradually reduce to 1-2 days per week.   Return precautions advised.  Tana Conch, MD

## 2023-01-30 NOTE — Patient Instructions (Addendum)
Aluminum chloride (drysol) external solution trial  We also discussed possibly pulling back on phentermine or gemtesa but you wanted to trial this first  Recommended follow up: Return in about 6 months (around 08/01/2023) for physical or sooner if needed.Schedule b4 you leave.

## 2023-02-13 DIAGNOSIS — Z780 Asymptomatic menopausal state: Secondary | ICD-10-CM | POA: Diagnosis not present

## 2023-03-01 ENCOUNTER — Other Ambulatory Visit: Payer: Self-pay | Admitting: Family Medicine

## 2023-03-01 DIAGNOSIS — F3342 Major depressive disorder, recurrent, in full remission: Secondary | ICD-10-CM

## 2023-03-11 ENCOUNTER — Other Ambulatory Visit: Payer: Self-pay | Admitting: Family Medicine

## 2023-03-11 DIAGNOSIS — E785 Hyperlipidemia, unspecified: Secondary | ICD-10-CM

## 2023-03-31 DIAGNOSIS — H524 Presbyopia: Secondary | ICD-10-CM | POA: Diagnosis not present

## 2023-03-31 DIAGNOSIS — H43813 Vitreous degeneration, bilateral: Secondary | ICD-10-CM | POA: Diagnosis not present

## 2023-04-09 DIAGNOSIS — Z1283 Encounter for screening for malignant neoplasm of skin: Secondary | ICD-10-CM | POA: Diagnosis not present

## 2023-04-09 DIAGNOSIS — D225 Melanocytic nevi of trunk: Secondary | ICD-10-CM | POA: Diagnosis not present

## 2023-04-14 DIAGNOSIS — R030 Elevated blood-pressure reading, without diagnosis of hypertension: Secondary | ICD-10-CM | POA: Diagnosis not present

## 2023-04-14 DIAGNOSIS — M7989 Other specified soft tissue disorders: Secondary | ICD-10-CM | POA: Diagnosis not present

## 2023-04-17 DIAGNOSIS — B078 Other viral warts: Secondary | ICD-10-CM | POA: Diagnosis not present

## 2023-04-17 DIAGNOSIS — L820 Inflamed seborrheic keratosis: Secondary | ICD-10-CM | POA: Diagnosis not present

## 2023-05-18 DIAGNOSIS — Z1231 Encounter for screening mammogram for malignant neoplasm of breast: Secondary | ICD-10-CM | POA: Diagnosis not present

## 2023-05-18 LAB — HM MAMMOGRAPHY

## 2023-05-21 DIAGNOSIS — I10 Essential (primary) hypertension: Secondary | ICD-10-CM | POA: Diagnosis not present

## 2023-05-21 DIAGNOSIS — R0602 Shortness of breath: Secondary | ICD-10-CM | POA: Diagnosis not present

## 2023-05-21 DIAGNOSIS — E7800 Pure hypercholesterolemia, unspecified: Secondary | ICD-10-CM | POA: Diagnosis not present

## 2023-05-21 DIAGNOSIS — Z79899 Other long term (current) drug therapy: Secondary | ICD-10-CM | POA: Diagnosis not present

## 2023-06-11 ENCOUNTER — Encounter: Payer: Self-pay | Admitting: Family Medicine

## 2023-06-11 DIAGNOSIS — M5416 Radiculopathy, lumbar region: Secondary | ICD-10-CM | POA: Diagnosis not present

## 2023-06-12 MED ORDER — ONDANSETRON 4 MG PO TBDP: 4.0000 mg | ORAL_TABLET | Freq: Three times a day (TID) | ORAL | 0 refills | Status: AC | PRN

## 2023-06-24 DIAGNOSIS — Z131 Encounter for screening for diabetes mellitus: Secondary | ICD-10-CM | POA: Diagnosis not present

## 2023-06-24 DIAGNOSIS — Z79899 Other long term (current) drug therapy: Secondary | ICD-10-CM | POA: Diagnosis not present

## 2023-06-24 DIAGNOSIS — R7303 Prediabetes: Secondary | ICD-10-CM | POA: Diagnosis not present

## 2023-06-24 DIAGNOSIS — R7401 Elevation of levels of liver transaminase levels: Secondary | ICD-10-CM | POA: Diagnosis not present

## 2023-06-24 DIAGNOSIS — E7800 Pure hypercholesterolemia, unspecified: Secondary | ICD-10-CM | POA: Diagnosis not present

## 2023-06-24 DIAGNOSIS — E559 Vitamin D deficiency, unspecified: Secondary | ICD-10-CM | POA: Diagnosis not present

## 2023-06-24 LAB — LAB REPORT - SCANNED
A1c: 6.1
Calcium: 10.2
EGFR: 96
HM Hepatitis Screen: NEGATIVE
PTH: 26.2

## 2023-07-03 ENCOUNTER — Ambulatory Visit
Admission: EM | Admit: 2023-07-03 | Discharge: 2023-07-03 | Disposition: A | Discharge status: Home / Self Care | Payer: Medicare Other | Attending: Nurse Practitioner | Admitting: Nurse Practitioner

## 2023-07-03 DIAGNOSIS — J069 Acute upper respiratory infection, unspecified: Secondary | ICD-10-CM

## 2023-07-03 LAB — POC COVID19/FLU A&B COMBO
Covid Antigen, POC: NEGATIVE
Influenza A Antigen, POC: NEGATIVE
Influenza B Antigen, POC: NEGATIVE

## 2023-07-03 MED ORDER — BENZONATATE 100 MG PO CAPS: 100.0000 mg | ORAL_CAPSULE | Freq: Three times a day (TID) | ORAL | 0 refills | Status: DC | PRN

## 2023-07-03 NOTE — ED Provider Notes (Signed)
[facility]    CSN: [id number] Arrival date & time: 07/03/23  1637      History   Chief Complaint Chief Complaint  Patient presents with   Cough    HPI Rachel Gates is a 67 y.o. female.   Patient presents today with 2-day history of fever, Tmax 101.2 F, body aches and chills, congested cough, runny and stuffy nose, sore throat, headache, right ear pressure with intermittent pain, decreased appetite, loss of taste, and fatigue.  Patient denies shortness of breath, chest pain, abdominal pain, nausea/vomiting, and diarrhea.  No drainage from the right ear and can hear normally from the right ear.  Has been around family numbers who have been sick with similar symptoms and have been diagnosed with bronchitis including her sister, grandchildren, and son-in-law.  Has been taking Delsym every 12 hours, Aleve every 12 hours, and Tylenol Cold and flu around-the-clock without much improvement.    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      Home Medications    Prior to Admission medications   Medication Sig Start Date End Date Taking? Authorizing Provider  benzonatate (TESSALON) 100 MG capsule Take 1  capsule (100 mg total) by mouth 3 (three) times daily as needed for cough. Do not take with alcohol or while driving or operating heavy machinery.  May cause drowsiness. 07/03/23  Yes Valentino Nose, NP  albuterol (VENTOLIN HFA) 108 (90 Base) MCG/ACT inhaler Inhale 2 puffs into the lungs every 6 (six) hours as needed for wheezing or shortness of breath. 11/01/21   Jeani Sow, MD  Aluminum Chloride in Alcohol 6.25 % SOLN Apply 1 Application topically at bedtime. 01/30/23   Shelva Majestic, MD  aspirin 81 MG EC tablet Take 81 mg by mouth daily.    [provider]  aspirin-acetaminophen-caffeine (EXCEDRIN MIGRAINE) [phone] MG tablet Take by mouth every 6 (six) hours as needed for headache.    [provider]  atenolol (TENORMIN) 25 MG tablet TAKE 1 TABLET BY MOUTH DAILY 03/12/23   Shelva Majestic, MD  calcium carbonate (OS-CAL) 600 MG TABS Take 600 mg by mouth 2 (two) times daily with a meal.    [provider]  citalopram (CELEXA) 10 MG tablet TAKE 1 TABLET BY MOUTH DAILY 03/02/23   Shelva Majestic, MD  fexofenadine (ALLEGRA) 60 MG tablet Take 60 mg by mouth 2 (two) times daily.    [provider]  fish oil-omega-3 fatty acids 1000 MG capsule Take 1 g by mouth daily.    [provider]  GEMTESA 75 MG TABS Take  1 tablet by mouth daily. 09/26/21   [provider]  LORazepam (ATIVAN) 0.5 MG tablet Take 1 tablet (0.5 mg total) by mouth 2 (two) times daily as needed for anxiety (do not drive for 8 hours after use or use within 8 hours of tramadol). 08/19/22   Shelva Majestic, MD  Multiple Vitamin (MULTIVITAMIN) tablet Take 1 tablet by mouth daily.    [provider]  ondansetron (ZOFRAN-ODT) 4 MG disintegrating tablet Take 1 tablet (4 mg total) by mouth every 8 (eight) hours as needed for nausea or vomiting. 06/12/23   Shelva Majestic, MD  phentermine (ADIPEX-P) 37.5 MG tablet Take 37.5 mg by mouth daily before breakfast.     [provider]  simvastatin (ZOCOR) 40 MG tablet TAKE 1 TABLET BY MOUTH AT  BEDTIME 03/12/23   Shelva Majestic, MD  vitamin B-12 (CYANOCOBALAMIN) 1000 MCG tablet Take 2,000 mcg by mouth daily.    [provider]    Family History Family History  Problem Relation Age of Onset   Hypertension Mother    Angina Mother        no stents   Uterine cancer Mother    Alzheimer's disease Mother        in facility since 07/16/2015. died sept July 15, 2020   Heart attack Father        mid 52s. died at 75   Glaucoma Father     Social History Social History   Tobacco Use   Smoking status: Never   Smokeless tobacco: Never  Substance Use Topics   Alcohol use: No   Drug use: No     Allergies   Prednisone   Review of Systems Review of Systems Per HPI  Physical Exam Triage Vital Signs ED Triage Vitals  Encounter Vitals Group     BP 07/03/23 1646 (!) 150/80     Systolic BP Percentile --      Diastolic BP Percentile --      Pulse Rate 07/03/23 1646 89     Resp 07/03/23 1646 16     Temp 07/03/23 1646 98.1 F (36.7 C)     Temp Source 07/03/23 1646 Oral     SpO2 07/03/23 1646 100 %     Weight --      Height --      Head Circumference --      Peak Flow --      Pain Score 07/03/23 1648 0     Pain Loc --      Pain Education --      Exclude from Growth Chart --    No data found.  Updated Vital Signs BP (!) 150/80 (BP Location: Right Arm)   Pulse 89   Temp 98.1 F (36.7 C) (Oral)   Resp 16   LMP  (LMP Unknown)   SpO2 100%   Visual Acuity Right Eye Distance:   Left Eye Distance:   Bilateral Distance:    Right Eye Near:   Left Eye Near:    Bilateral Near:     Physical Exam Vitals and nursing note reviewed.  Constitutional:      General: She is not in acute distress.    Appearance: Normal appearance. She is not ill-appearing or toxic-appearing.  HENT:     Head: Normocephalic and atraumatic.     Right Ear: There is impacted cerumen.     Left Ear: Tympanic  membrane, ear canal and external ear normal.     Nose: Congestion and  rhinorrhea present.     Mouth/Throat:     Mouth: Mucous membranes are moist.     Pharynx: Oropharynx is clear. No oropharyngeal exudate or posterior oropharyngeal erythema.  Eyes:     General: No scleral icterus.    Extraocular Movements: Extraocular movements intact.  Cardiovascular:     Rate and Rhythm: Normal rate and regular rhythm.  Pulmonary:     Effort: Pulmonary effort is normal. No respiratory distress.     Breath sounds: Normal breath sounds. No wheezing, rhonchi or rales.  Musculoskeletal:     Cervical back: Normal range of motion and neck supple.  Lymphadenopathy:     Cervical: No cervical adenopathy.  Skin:    General: Skin is warm and dry.     Coloration: Skin is not jaundiced or pale.     Findings: No erythema or rash.  Neurological:     Mental Status: She is alert and oriented to person, place, and time.  Psychiatric:        Behavior: Behavior is cooperative.      UC Treatments / Results  Labs (all labs ordered are listed, but only abnormal results are displayed) Labs Reviewed  POC COVID19/FLU A&B COMBO    EKG   Radiology No results found.  Procedures Procedures (including critical care time)  Medications Ordered in UC Medications - No data to display  Initial Impression / Assessment and Plan / UC Course  I have reviewed the triage vital signs and the nursing notes.  Pertinent labs & imaging results that were available during my care of the patient were reviewed by me and considered in my medical decision making (see chart for details).   Patient is well-appearing, afebrile, not tachycardic, not tachypneic, oxygenating well on room air.  Patient is mildly hypertensive in urgent care.  1. Viral URI with cough Vitals and exam are reassuring Lungs are clear to auscultation bilaterally Suspect viral etiology COVID-19 and influenza tests are negative Supportive care discussed,  cough suppressant medication prescribed Start Debrox drops for right ear Return and ER precautions discussed  The patient was given the opportunity to ask questions.  All questions answered to their satisfaction.  The patient is in agreement to this plan.    Final Clinical Impressions(s) / UC Diagnoses   Final diagnoses:  Viral URI with cough     Discharge Instructions      You have a viral upper respiratory infection.  Symptoms should improve over the next week to 10 days.  If you develop chest pain or shortness of breath, go to the emergency room.  COVID-19 and influenza tests today are negative today.  Some things that can make you feel better are: - Increased rest - Increasing fluid with water/sugar free electrolytes - Acetaminophen and ibuprofen as needed for fever/pain - Salt water gargling, chloraseptic spray and throat lozenges - OTC guaifenesin (Mucinex) 600 mg twice daily - Saline sinus flushes or a neti pot - Humidifying the air -Tessalon Perles every 8 hours as needed for dry cough      ED Prescriptions     Medication Sig Dispense Auth. Provider   benzonatate (TESSALON) 100 MG capsule Take 1 capsule (100 mg total) by mouth 3 (three) times daily as needed for cough. Do not take with alcohol or while driving or operating heavy machinery.  May cause drowsiness. 30 capsule Valentino Nose, NP      PDMP not reviewed this encounter.   Valentino Nose, NP 07/03/23 [id number]

## 2023-07-03 NOTE — ED Triage Notes (Signed)
Pt states cough,sore throat and right ear pain for the past 3 days. States she has taken OTC cold medicines at home.

## 2023-07-03 NOTE — Discharge Instructions (Addendum)
You have a viral upper respiratory infection.  Symptoms should improve over the next week to 10 days.  If you develop chest pain or shortness of breath, go to the emergency room.  COVID-19 and influenza tests today are negative today.  Some things that can make you feel better are: - Increased rest - Increasing fluid with water/sugar free electrolytes - Acetaminophen and ibuprofen as needed for fever/pain - Salt water gargling, chloraseptic spray and throat lozenges - OTC guaifenesin (Mucinex) 600 mg twice daily - Saline sinus flushes or a neti pot - Humidifying the air -Tessalon Perles every 8 hours as needed for dry cough

## 2023-07-06 ENCOUNTER — Encounter: Payer: Self-pay | Admitting: Family Medicine

## 2023-07-07 DIAGNOSIS — H2513 Age-related nuclear cataract, bilateral: Secondary | ICD-10-CM | POA: Diagnosis not present

## 2023-07-07 DIAGNOSIS — H25013 Cortical age-related cataract, bilateral: Secondary | ICD-10-CM | POA: Diagnosis not present

## 2023-07-07 DIAGNOSIS — H2511 Age-related nuclear cataract, right eye: Secondary | ICD-10-CM | POA: Diagnosis not present

## 2023-07-07 DIAGNOSIS — H18413 Arcus senilis, bilateral: Secondary | ICD-10-CM | POA: Diagnosis not present

## 2023-07-07 DIAGNOSIS — H25043 Posterior subcapsular polar age-related cataract, bilateral: Secondary | ICD-10-CM | POA: Diagnosis not present

## 2023-07-07 NOTE — Telephone Encounter (Signed)
LVM to get pt scheduled

## 2023-07-07 NOTE — Telephone Encounter (Signed)
Pt is feeling a lot better, does not need to come in.

## 2023-08-11 ENCOUNTER — Encounter: Payer: Self-pay | Admitting: Family Medicine

## 2023-08-11 ENCOUNTER — Ambulatory Visit (INDEPENDENT_AMBULATORY_CARE_PROVIDER_SITE_OTHER): Payer: Medicare Other | Admitting: Family Medicine

## 2023-08-11 VITALS — BP 124/78 | HR 66 | Temp 97.9°F | Ht 67.0 in | Wt 231.4 lb

## 2023-08-11 DIAGNOSIS — Z131 Encounter for screening for diabetes mellitus: Secondary | ICD-10-CM

## 2023-08-11 DIAGNOSIS — R739 Hyperglycemia, unspecified: Secondary | ICD-10-CM | POA: Diagnosis not present

## 2023-08-11 DIAGNOSIS — F3342 Major depressive disorder, recurrent, in full remission: Secondary | ICD-10-CM

## 2023-08-11 DIAGNOSIS — Z0000 Encounter for general adult medical examination without abnormal findings: Secondary | ICD-10-CM | POA: Diagnosis not present

## 2023-08-11 DIAGNOSIS — I10 Essential (primary) hypertension: Secondary | ICD-10-CM | POA: Diagnosis not present

## 2023-08-11 DIAGNOSIS — E785 Hyperlipidemia, unspecified: Secondary | ICD-10-CM | POA: Diagnosis not present

## 2023-08-11 MED ORDER — ATENOLOL 25 MG PO TABS: ORAL_TABLET | ORAL | 3 refills | Status: DC

## 2023-08-11 MED ORDER — SIMVASTATIN 40 MG PO TABS: 40.0000 mg | ORAL_TABLET | Freq: Every day | ORAL | 3 refills | Status: DC

## 2023-08-11 MED ORDER — CITALOPRAM HYDROBROMIDE 10 MG PO TABS: 10.0000 mg | ORAL_TABLET | Freq: Every day | ORAL | 3 refills | Status: DC

## 2023-08-11 NOTE — Patient Instructions (Addendum)
 Consider Tetanus, Diphtheria, and Pertussis (Tdap)  shot at pharmacy  Team please get copy of mammogram from 05/18/2023 from her GYN  lungs clear today but if not continuing to improve we can do x-ray of lungs   Please stop by lab before you go If you have mychart- we will send your results within 3 business days of us  receiving them.  If you do not have mychart- we will call you about results within 5 business days of us  receiving them.  *please also note that you will see labs on mychart as soon as they post. I will later go in and write notes on them- will say notes from Dr. Katrinka   Recommended follow up: Return in about 6 months (around 02/08/2024) for followup or sooner if needed.Schedule b4 you leave.

## 2023-08-11 NOTE — Progress Notes (Signed)
 Phone [phone]   Subjective:  Patient presents today for their annual physical. Chief complaint-noted.   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- just got back on 20th  The following were reviewed and entered/updated in epic: Past Medical History:  Diagnosis Date   Allergy    Anxiety    Depression    Hyperlipidemia    Hypertension    Obese    Patient Active Problem List   Diagnosis Date Noted   Hyperglycemia 06/13/2022    Priority: Medium    Idiopathic neuropathy 12/30/2021    Priority: Medium    Migraine headache with aura 10/03/2014    Priority: Medium    Essential hypertension 07/03/2008    Priority: Medium    Hyperlipidemia 01/08/2007    Priority: Medium    Depression 01/08/2007    Priority: Medium    History of total hysterectomy 05/06/2018    Priority: Low   Hip bursitis 05/12/2016    Priority: Low   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 died sept 2020-08-06   Heart attack Father        mid 16s. died at 90   Glaucoma Father    Lymphoma Sister        in spinal fluid- at [facility] and doing ok Aug 07, 2023    Medications- reviewed and updated Current Outpatient Medications  Medication Sig Dispense Refill   albuterol  (VENTOLIN  HFA) 108 (90 Base) MCG/ACT inhaler Inhale 2 puffs  into the lungs every 6 (six) hours as needed for wheezing or shortness of breath. 8 g 0   Aluminum  Chloride in Alcohol  6.25 % SOLN Apply 1 Application topically at bedtime. 60 mL 11   aspirin  81 MG EC tablet Take 81 mg by mouth daily.     aspirin -acetaminophen -caffeine (EXCEDRIN MIGRAINE) 250-250-65 MG tablet Take by mouth every 6 (six) hours as needed for headache.     atenolol  (TENORMIN ) 25 MG tablet TAKE 1 TABLET BY MOUTH DAILY 100 tablet 2   calcium carbonate (OS-CAL) 600 MG TABS Take 600 mg by mouth 2 (two) times daily with a meal.     citalopram  (CELEXA ) 10 MG tablet TAKE 1 TABLET BY MOUTH DAILY 90 tablet 3   fexofenadine (ALLEGRA) 60 MG tablet Take 60 mg by mouth 2 (two) times daily.     fish oil-omega-3 fatty acids 1000 MG capsule Take 1 g by mouth daily.     GEMTESA 75 MG TABS Take 1 tablet by mouth daily.     LORazepam  (ATIVAN ) 0.5 MG tablet Take 1 tablet (0.5 mg total) by mouth 2 (two) times daily as needed for anxiety (do not drive for 8 hours  after use or use within 8 hours of tramadol ). 30 tablet 0   Multiple Vitamin (MULTIVITAMIN) tablet Take 1 tablet by mouth daily.     ondansetron  (ZOFRAN -ODT) 4 MG disintegrating tablet Take 1 tablet (4 mg total) by mouth every 8 (eight) hours as needed for nausea or vomiting. 20 tablet 0   simvastatin  (ZOCOR ) 40 MG tablet TAKE 1 TABLET BY MOUTH AT  BEDTIME 100 tablet 2   vitamin B-12 (CYANOCOBALAMIN ) 1000 MCG tablet Take 2,000 mcg by mouth daily.     No current facility-administered medications for this visit.    Allergies-reviewed and updated Allergies  Allergen Reactions   Prednisone  Other (See Comments)    Pt does not tolerate oral prednisone -causes severe migraine, body on fire, heart racing- Injections only    Social History   Social History Narrative   Lives with her husband. Their adult children live nearby- 2 living children, 1  daughter died tragically in 2022-08-05 (did not have kids). 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 Oropharynx normal, tympanic membrane normal on left, right occluded by cerumen Neck: no thyromegaly CV: RRR no murmurs rubs or gallops Lungs: CTAB no crackles, wheeze, rhonchi Abdomen: soft/nontender/nondistended/normal bowel sounds. No rebound or guarding.  Ext: no edema Skin: warm, dry Neuro: grossly normal, moves all extremities, PERRLA   Assessment and Plan   67 y.o. female presenting for annual physical.  Health Maintenance counseling: 1. Anticipatory guidance: Patient counseled regarding regular dental exams -q6 months, eye exams -sees Dr. Oman- upcoming cataract surgery,  avoiding smoking and second hand smoke, limiting alcohol  to 1 beverage per day-perahps one a year if that , no illicit drugs .   2. Risk factor reduction:  Advised patient of need for regular exercise and diet rich and fruits and vegetables to reduce risk of heart attack and stroke.  Exercise- walking as stress reliever- 3-4 days a week for 30-45 mins.  Diet/weight management-phentermine with Dr[facility] in past- but on new prescription- weight largely stable from last visit. She's working hard to [employer] Readings from Last 3 Encounters:  08/11/23 231 lb 6.4 oz (105 kg)  01/30/23 228 lb (103.4 kg)  08/12/22 245 lb (111.1 kg)  3. Immunizations/screenings/ancillary studies-up to date other than needs Tetanus, Diphtheria, and Pertussis (Tdap) at pharmacy , COVID - already had in  september Immunization History  Administered Date(s) Administered   Fluad Quad(high Dose 65+) 05/21/2020, 05/06/2021   Hep A / Hep B 04/23/2022   Hep B, Unspecified 05/06/2022   Hepatitis A, Adult 05/06/2022   Influenza Split 04/15/2012   Influenza Whole 08/16/2009   Influenza, High Dose Seasonal PF 05/06/2021, 04/21/2022   Influenza,inj,Quad PF,6+ Mos 05/08/2014, 05/22/2015, 05/12/2016, 04/30/2017, 05/06/2018, 05/11/2019   Influenza,inj,quad, With Preservative 04/11/2018   Influenza-Unspecified 05/06/2023   Moderna Covid-19 Vaccine Bivalent Booster  49yrs & up 05/06/2021   Moderna SARS-COV2 Booster Vaccination 05/07/2022   PFIZER(Purple Top)SARS-COV-2 Vaccination 10/15/2019, 11/05/2019, 06/22/2020   PNEUMOCOCCAL CONJUGATE-20 04/23/2022   Pneumococcal Conjugate-13 05/22/2015   Pneumococcal Polysaccharide-23 05/21/2020   Pneumococcal-Unspecified 04/23/2022   Respiratory Syncytial Virus Vaccine,Recomb Aduvanted(Arexvy) 04/21/2022   Td 01/02/2004   Tdap 05/05/2013   Unspecified SARS-COV-2 Vaccination 05/06/2021, 05/06/2023   Zoster Recombinant(Shingrix) 08/08/2017, 10/23/2017   4. Cervical cancer screening- follows with GYN- past age based screening  plus hysterectomy including cervixrecommendations  5. Breast cancer screening-  breast exam with gyn and mammogram 05/18/23  6. Colon cancer screening - 2019 [facility]oscopy with 10 year repeat 7. Skin cancer screening-follows with Dr. [place] practice. advised regular sunscreen use. Denies worrisome, changing, or new skin lesions. Next visit in February with dads melanoma history 8. Birth control/STD check- hysterectomy/monogamous 9. Osteoporosis screening at 55- 2021 with Dr. Curlene and records previously mentioned 5 year repeat 10. Smoking associated screening - never smoker  Status of chronic or acute concerns   #congestion- started in November  and was doing better then worsened again after cruise- finally starting to feel  better- used a lot of robitussin and felt run down.  -lungs clear today but if not continuing to improve we can do x-ray of lungs  - right ear clogged with cerumen- plans on debrox at home - tessalon  Perles not helpful  #hypertension S: medication: Atenolol  25 mg daily Home readings #s: good readings at home BP Readings from Last 3 Encounters:  08/11/23 124/78  07/03/23 (!) 150/80  01/30/23 138/84  A/P: stable- continue current medicines   #hyperlipidemia S: Medication:Simvastatin  40 mg  Lab Results  Component Value Date   CHOL 154 06/13/2022   HDL 37.10 (L) 06/13/2022   LDLCALC 112 (H) 05/21/2020   LDLDIRECT 88.0 06/13/2022   TRIG 239.0 (H) 06/13/2022   CHOLHDL 4 06/13/2022   A/P: had labs at [facility] and she will drop those off for us - we can hold off on labs for now   # Depression S: Medication: citalopram    10 mg -Works with therapist -Contributing factors: Loss of her husband in 2024 and daughter in 2023    08/11/2023    8:54 AM 01/30/2023   11:23 AM 08/12/2022    3:23 PM  Depression screen PHQ 2/9  Decreased Interest 0 0 0  Down, Depressed, Hopeless 0 0 0  PHQ - 2 Score 0 0 0  Altered sleeping 0 0 0  Tired, decreased energy 1 0 0  Change in appetite 0 0 0  Feeling bad or failure about yourself  0 0 0  Trouble concentrating 0 0 0  Moving slowly or fidgety/restless 0 0 0  Suicidal thoughts 0 0 0  PHQ-9 Score 1 0 0  Difficult doing work/chores Not difficult at all  Not difficult at all  A/P: reasonable control/full remission- continue current medications   # Primary focal hyperhidrosis-craniofacial pattern S: Medication: Aluminum  chloride and alcohol  6.25% solution apply at bedtime until symptoms slowed then reduce to 1 to 2 days/week  -Seem to worsen after Gemtesa but that medicine was helpful so want to continued - Also had worsened on phentermine but had wanted to continue -citalopram  can contribute A/P: better off phentermine- not needing the topical solution  anymore    # Overactive bladder-on Gemtesa and finds helpful   # Obesity-on phentermine through outside provider but thankfully blood pressure has remained controlled- was taken off of recently- not sure of new name of medicine -  coming off this helped some  Recommended follow up: Return in about 6 months (around 02/08/2024) for followup or sooner if needed.Schedule b4 you leave. Future Appointments  Date Time Provider Department Center  08/31/2023  1:40 PM [facility] ANNUAL WELLNESS VISIT 1 [facility]   Lab/Order associations: fasting but holding off on labs   ICD-10-CM   1. Preventative health care  Z00.00     2. Essential hypertension  I10     3. Hyperlipidemia, unspecified hyperlipidemia type  E78.5     4. Hyperglycemia  R73.9     5. Screening for diabetes mellitus  Z13.1     6. Recurrent major depressive disorder, in full remission (HCC)  F33.42       No orders of the defined types were placed in this encounter.   Return precautions advised.  Garnette Lukes, MD

## 2023-08-13 DIAGNOSIS — R0602 Shortness of breath: Secondary | ICD-10-CM | POA: Diagnosis not present

## 2023-08-31 ENCOUNTER — Ambulatory Visit (INDEPENDENT_AMBULATORY_CARE_PROVIDER_SITE_OTHER): Payer: Medicare Other

## 2023-08-31 VITALS — Wt 225.0 lb

## 2023-08-31 DIAGNOSIS — Z0000 Encounter for general adult medical examination without abnormal findings: Secondary | ICD-10-CM | POA: Diagnosis not present

## 2023-08-31 NOTE — Progress Notes (Signed)
Subjective:   Rachel Gates is a 68 y.o. female who presents for Medicare Annual (Subsequent) preventive examination.  Visit Complete: Virtual I connected with  Rachel Gates on 08/31/23 by a audio enabled telemedicine application and verified that I am speaking with the correct person using two identifiers.  Patient Location: Home  Provider Location: Office/Clinic  I discussed the limitations of evaluation and management by telemedicine. The patient expressed understanding and agreed to proceed.  Vital Signs: Because this visit was a virtual/telehealth visit, some criteria may be missing or patient reported. Any vitals not documented were not able to be obtained and vitals that have been documented are patient reported.   Cardiac Risk Factors include: advanced age (>43men, >46 women);dyslipidemia;hypertension;obesity (BMI >30kg/m2)     Objective:    Today's Vitals   08/31/23 1345  Weight: 225 lb (102.1 kg)   Body mass index is 35.24 kg/m.     08/31/2023    1:50 PM 08/12/2022    3:25 PM 05/27/2021    2:37 PM  Advanced Directives  Does Patient Have a Medical Advance Directive? No Yes Yes  Type of Special educational needs teacher of [place];Living will   Does patient want to make changes to medical advance directive?   Yes (MAU/Ambulatory/Procedural Areas - Information given)  Copy of Healthcare Power of Attorney in Chart?  No - copy requested   Would patient like information on creating a medical advance directive? No - Patient declined      Current Medications (verified) Outpatient Encounter Medications as of 08/31/2023  Medication Sig   albuterol (VENTOLIN HFA) 108 (90 Base) MCG/ACT inhaler Inhale 2 puffs into the lungs every 6 (six) hours as needed for wheezing or shortness of breath.   aspirin 81 MG EC tablet Take 81 mg by mouth daily.   aspirin-acetaminophen-caffeine (EXCEDRIN MIGRAINE) 250-250-65 MG tablet Take by mouth every 6 (six) hours as needed for headache.    atenolol (TENORMIN) 25 MG tablet TAKE 1 TABLET BY MOUTH DAILY   calcium carbonate (OS-CAL) 600 MG TABS Take 600 mg by mouth 2 (two) times daily with a meal.   citalopram (CELEXA) 10 MG tablet Take 1 tablet (10 mg total) by mouth daily.   fexofenadine (ALLEGRA) 60 MG tablet Take 60 mg by mouth 2 (two) times daily.   fish oil-omega-3 fatty acids 1000 MG capsule Take 1 g by mouth daily.   GEMTESA 75 MG TABS Take 1 tablet by mouth daily.   LORazepam (ATIVAN) 0.5 MG tablet Take 1 tablet (0.5 mg total) by mouth 2 (two) times daily as needed for anxiety (do not drive for 8 hours after use or use within 8 hours of tramadol).   Multiple Vitamin (MULTIVITAMIN) tablet Take 1 tablet by mouth daily.   ondansetron (ZOFRAN-ODT) 4 MG disintegrating tablet Take 1 tablet (4 mg total) by mouth every 8 (eight) hours as needed for nausea or vomiting.   simvastatin (ZOCOR) 40 MG tablet Take 1 tablet (40 mg total) by mouth at bedtime.   vitamin B-12 (CYANOCOBALAMIN) 1000 MCG tablet Take 2,000 mcg by mouth daily.   VITAMIN D PO Take by mouth.   [DISCONTINUED] Aluminum Chloride in Alcohol 6.25 % SOLN Apply 1 Application topically at bedtime.   No facility-administered encounter medications on file as of 08/31/2023.    Allergies (verified) Prednisone   History: Past Medical History:  Diagnosis Date   Allergy    Anxiety    Depression    Hyperlipidemia    Hypertension  Obese    Past Surgical History:  Procedure Laterality Date   ABDOMINAL HYSTERECTOMY     carpal tunnel surgery bilaterally     CHOLECYSTECTOMY     left foot fascia release     parotid gland surgery right side     Family History  Problem Relation Age of Onset   Hypertension Mother    Angina Mother        no stents   Uterine cancer Mother    Alzheimer's disease Mother        in facility since 2014/09/27. died sept 2019/09/28   Heart attack Father        mid 48s. died at 64   Glaucoma Father    Lymphoma Sister        in spinal fluid- at [facility]  and doing ok September 27, 2022   Social History   Socioeconomic History   Marital status: Widowed    Spouse name: [name]   Number of children: 3   Years of education: 12+   Highest education level: Not on file  Occupational History   Occupation: Quarry manager: [employer]  Tobacco Use   Smoking status: Never   Smokeless tobacco: Never  Substance and Sexual Activity   Alcohol use: No   Drug use: No   Sexual activity: Not on file  Other Topics Concern   Not on file  Social History Narrative   Lives with her husband. Their adult children live nearby- 2 living children, 1  daughter died tragically in 2021/09/27 (did not have kids). 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- lost them in 09-28-2019 as well      Hobbies: caretaker role takes a lot of her time, paint, cross stitch, gardenening, reading    Social Drivers of Health   Financial Resource Strain: Low Risk  (08/31/2023)   Overall Financial Resource Strain (CARDIA)    Difficulty of Paying Living Expenses: Not hard at all  Food Insecurity: No Food Insecurity (08/12/2022)   Hunger Vital Sign    Worried About Running Out of Food in the Last Year: Never true    Ran Out of Food in the Last Year: Never true  Transportation Needs: No Transportation Needs (08/12/2022)   PRAPARE - Administrator, Civil Service (Medical): No    Lack of Transportation (Non-Medical): No  Physical Activity: Insufficiently Active (08/31/2023)   Exercise Vital Sign    Days of Exercise per Week: 3 days    Minutes of Exercise per Session: 40 min  Stress: No Stress Concern Present (08/31/2023)   [employer] of Occupational Health - Occupational Stress Questionnaire    Feeling of Stress : Not at all  Social Connections: Socially Isolated (08/31/2023)   Social Connection and Isolation Panel [NHANES]    Frequency of Communication with Friends and  Family: More than three times a week    Frequency of Social Gatherings with Friends and Family: More than three times a week    Attends Religious Services: Never    Database administrator or Organizations: No    Attends Banker Meetings: Never    Marital Status: Widowed    Tobacco Counseling Counseling given: Not Answered   Clinical Intake:  Pre-visit preparation completed: Yes  Pain : No/denies pain     BMI - recorded: 35.24 Nutritional Status: BMI >  30  Obese Diabetes: No  How often do you need to have someone help you when you read instructions, pamphlets, or other written materials from your doctor or pharmacy?: 1 - Never  Interpreter Needed?: No  Information entered by :: Lanier Ensign, LPN   Activities of Daily Living    08/31/2023    1:46 PM  In your present state of health, do you have any difficulty performing the following activities:  Hearing? 0  Vision? 0  Difficulty concentrating or making decisions? 0  Walking or climbing stairs? 0  Dressing or bathing? 0  Doing errands, shopping? 0  Preparing Food and eating ? N  Using the Toilet? N  In the past six months, have you accidently leaked urine? Y  Comment incontinence wears  a pad at times  Do you have problems with loss of bowel control? N  Managing your Medications? N  Managing your Finances? N  Housekeeping or managing your Housekeeping? N    Patient Care Team: Shelva Majestic, MD as PCP - General (Family Medicine)  Indicate any recent Medical Services you may have received from other than [facility] providers in the past year (date may be approximate).     Assessment:   This is a routine wellness examination for Rachel Gates.  Hearing/Vision screen Hearing Screening - Comments:: Pt denies any hearing issues  Vision Screening - Comments:: Pt follows up with [facility] for annual eye exams    Goals Addressed             This Visit's Progress    Patient Stated       Be more active  and social        Depression Screen    08/31/2023    1:49 PM 08/11/2023    8:54 AM 01/30/2023   11:23 AM 08/12/2022    3:23 PM 06/13/2022   11:10 AM 03/11/2022    3:56 PM 11/01/2021   11:32 AM  PHQ 2/9 Scores  PHQ - 2 Score 0 0 0 0 0 0 0  PHQ- 9 Score 0 1 0 0 0 2 0    Fall Risk    08/31/2023    1:53 PM 08/11/2023    8:54 AM 08/12/2022    3:26 PM 11/01/2021   11:33 AM 05/27/2021    3:02 PM  Fall Risk   Falls in the past year? 0 0 0 0 0  Number falls in past yr: 0 0 0 0 0  Injury with Fall? 0 0 0 0 0  Risk for fall due to : No Fall Risks No Fall Risks Impaired vision  Impaired vision  Follow up Falls prevention discussed Falls evaluation completed;Falls prevention discussed Falls prevention discussed  Falls prevention discussed    MEDICARE RISK AT HOME: Medicare Risk at Home Any stairs in or around the home?: Yes If so, are there any without handrails?: No Home free of loose throw rugs in walkways, pet beds, electrical cords, etc?: Yes Adequate lighting in your home to reduce risk of falls?: Yes Life alert?: No Use of a cane, walker or w/c?: No Grab bars in the bathroom?: Yes Shower chair or bench in shower?: Yes Elevated toilet seat or a handicapped toilet?: No  TIMED UP AND GO:  Was the test performed?  No    Cognitive Function:        08/31/2023    1:54 PM 08/12/2022    3:29 PM 05/27/2021    3:03 PM  6CIT Screen  What Year? 0 points 0 points 0 points  What month? 0 points 0 points 0 points  What time? 0 points 0 points 0 points  Count back from 20 0 points 0 points 0 points  Months in reverse 0 points 0 points 0 points  Repeat phrase 0 points 0 points 0 points  Total Score 0 points 0 points 0 points    Immunizations Immunization History  Administered Date(s) Administered   Fluad Quad(high Dose 65+) 05/21/2020, 05/06/2021   Hep A / Hep B 04/23/2022   Hep B, Unspecified 05/06/2022   Hepatitis A, Adult 05/06/2022   Influenza Split 04/15/2012   Influenza  Whole 08/16/2009   Influenza, High Dose Seasonal PF 05/06/2021, 04/21/2022   Influenza,inj,Quad PF,6+ Mos 05/08/2014, 05/22/2015, 05/12/2016, 04/30/2017, 05/06/2018, 05/11/2019   Influenza,inj,quad, With Preservative 04/11/2018   Influenza-Unspecified 05/06/2023   Moderna Covid-19 Vaccine Bivalent Booster 12yrs & up 05/06/2021   Moderna SARS-COV2 Booster Vaccination 05/07/2022   PFIZER(Purple Top)SARS-COV-2 Vaccination 10/15/2019, 11/05/2019, 06/22/2020   PNEUMOCOCCAL CONJUGATE-20 04/23/2022   Pneumococcal Conjugate-13 05/22/2015   Pneumococcal Polysaccharide-23 05/21/2020   Pneumococcal-Unspecified 04/23/2022   Respiratory Syncytial Virus Vaccine,Recomb Aduvanted(Arexvy) 04/21/2022   Td 01/02/2004   Tdap 05/05/2013   Unspecified SARS-COV-2 Vaccination 05/06/2021, 05/06/2023   Zoster Recombinant(Shingrix) 08/08/2017, 10/23/2017    TDAP status: Due, Education has been provided regarding the importance of this vaccine. Advised may receive this vaccine at local pharmacy or Health Dept. Aware to provide a copy of the vaccination record if obtained from local pharmacy or Health Dept. Verbalized acceptance and understanding.  Flu Vaccine status: Up to date  Pneumococcal vaccine status: Up to date  Covid-19 vaccine status: Information provided on how to obtain vaccines.   Qualifies for Shingles Vaccine? Yes   Zostavax completed Yes   Shingrix Completed?: Yes  Screening Tests Health Maintenance  Topic Date Due   DTaP/Tdap/Td (3 - Td or Tdap) 05/06/2023   COVID-19 Vaccine (6 - 2024-25 season) 07/01/2023   Hepatitis C Screening  08/15/2098 (Originally 10/07/1973)   Medicare Annual Wellness (AWV)  08/30/2024   MAMMOGRAM  05/17/2025   Colonoscopy  10/17/2026   Pneumonia Vaccine 21+ Years old  Completed   INFLUENZA VACCINE  Completed   DEXA SCAN  Completed   Zoster Vaccines- Shingrix  Completed   HPV VACCINES  Aged Out    Health Maintenance  Health Maintenance Due  Topic Date Due    DTaP/Tdap/Td (3 - Td or Tdap) 05/06/2023   COVID-19 Vaccine (6 - 2024-25 season) 07/01/2023    Colorectal cancer screening: Type of screening: Colonoscopy. Completed 10/16/16. Repeat every 10 years  Mammogram status: Completed 05/18/23. Repeat every year  Bone Density status: Completed 01/23/20. Results reflect: Bone density results: NORMAL. Repeat every 2 years.  Additional Screening:  Hepatitis C Screening: does qualify  Vision Screening: Recommended annual ophthalmology exams for early detection of glaucoma and other disorders of the eye. Is the patient up to date with their annual eye exam?  Yes  Who is the provider or what is the name of the office in which the patient attends annual eye exams? [facility] for annual eye exams  If pt is not established with a provider, would they like to be referred to a provider to establish care? No .   Dental Screening: Recommended annual dental exams for proper oral hygiene  Community Resource Referral / Chronic Care Management: CRR required this visit?  No   CCM required this visit?  No     Plan:  I have personally reviewed and noted the following in the patient's chart:   Medical and social history Use of alcohol, tobacco or illicit drugs  Current medications and supplements including opioid prescriptions. Patient is not currently taking opioid prescriptions. Functional ability and status Nutritional status Physical activity Advanced directives List of other physicians Hospitalizations, surgeries, and ER visits in previous 12 months Vitals Screenings to include cognitive, depression, and falls Referrals and appointments  In addition, I have reviewed and discussed with patient certain preventive protocols, quality metrics, and best practice recommendations. A written personalized care plan for preventive services as well as general preventive health recommendations were provided to patient.     Marzella Schlein,  LPN   11/17/8117   After Visit Summary: (MyChart) Due to this being a telephonic visit, the after visit summary with patients personalized plan was offered to patient via MyChart   Nurse Notes: pt preference is telephonic

## 2023-08-31 NOTE — Patient Instructions (Signed)
Rachel Gates , Thank you for taking time to come for your Medicare Wellness Visit. I appreciate your ongoing commitment to your health goals. Please review the following plan we discussed and let me know if I can assist you in the future.   Referrals/Orders/Follow-Ups/Clinician Recommendations: Aim for 30 minutes of exercise or brisk walking, 6-8 glasses of water, and 5 servings of fruits and vegetables each day. Continue to get out be more active and stay social   This is a list of the screening recommended for you and due dates:  Health Maintenance  Topic Date Due   DTaP/Tdap/Td vaccine (3 - Td or Tdap) 05/06/2023   COVID-19 Vaccine (6 - 2024-25 season) 07/01/2023   Hepatitis C Screening  08/15/2098*   Medicare Annual Wellness Visit  08/30/2024   Mammogram  05/17/2025   Colon Cancer Screening  10/17/2026   Pneumonia Vaccine  Completed   Flu Shot  Completed   DEXA scan (bone density measurement)  Completed   Zoster (Shingles) Vaccine  Completed   HPV Vaccine  Aged Out  *Topic was postponed. The date shown is not the original due date.    Advanced directives: (Declined) Advance directive discussed with you today. Even though you declined this today, please call our office should you change your mind, and we can give you the proper paperwork for you to fill out.  Next Medicare Annual Wellness Visit scheduled for next year: Yes   This patient declined Interactive audio and video telecommunications. Therefore the visit was completed with audio only.

## 2023-09-02 DIAGNOSIS — H2511 Age-related nuclear cataract, right eye: Secondary | ICD-10-CM | POA: Diagnosis not present

## 2023-09-03 DIAGNOSIS — H2512 Age-related nuclear cataract, left eye: Secondary | ICD-10-CM | POA: Diagnosis not present

## 2023-09-23 DIAGNOSIS — H2512 Age-related nuclear cataract, left eye: Secondary | ICD-10-CM | POA: Diagnosis not present

## 2023-10-07 DIAGNOSIS — R7303 Prediabetes: Secondary | ICD-10-CM | POA: Diagnosis not present

## 2023-10-07 DIAGNOSIS — H6121 Impacted cerumen, right ear: Secondary | ICD-10-CM | POA: Diagnosis not present

## 2023-10-08 DIAGNOSIS — D225 Melanocytic nevi of trunk: Secondary | ICD-10-CM | POA: Diagnosis not present

## 2023-10-08 DIAGNOSIS — Z1283 Encounter for screening for malignant neoplasm of skin: Secondary | ICD-10-CM | POA: Diagnosis not present

## 2023-10-08 DIAGNOSIS — D485 Neoplasm of uncertain behavior of skin: Secondary | ICD-10-CM | POA: Diagnosis not present

## 2023-11-04 ENCOUNTER — Ambulatory Visit: Admission: EM | Admit: 2023-11-04 | Discharge: 2023-11-04 | Disposition: A | Discharge status: Home / Self Care

## 2023-11-04 ENCOUNTER — Encounter: Payer: Self-pay | Admitting: Emergency Medicine

## 2023-11-04 DIAGNOSIS — J0300 Acute streptococcal tonsillitis, unspecified: Secondary | ICD-10-CM

## 2023-11-04 LAB — POC COVID19/FLU A&B COMBO
Covid Antigen, POC: NEGATIVE
Influenza A Antigen, POC: NEGATIVE
Influenza B Antigen, POC: NEGATIVE

## 2023-11-04 LAB — POCT RAPID STREP A (OFFICE): Rapid Strep A Screen: POSITIVE — AB

## 2023-11-04 MED ORDER — AMOXICILLIN 875 MG PO TABS: 875.0000 mg | ORAL_TABLET | Freq: Two times a day (BID) | ORAL | 0 refills | Status: DC

## 2023-11-04 MED ORDER — LIDOCAINE VISCOUS HCL 2 % MT SOLN: 10.0000 mL | OROMUCOSAL | 0 refills | Status: DC | PRN

## 2023-11-04 NOTE — Discharge Instructions (Signed)
 Make sure to take the full course of antibiotics even once feeling better.  Change out your toothbrush after 1 to 2 days on the medication and wash any reusable water bottles or other items.  May take over-the-counter pain relievers as needed and I have sent in some numbing liquid for you to gargle as needed.

## 2023-11-04 NOTE — ED Triage Notes (Signed)
 Can't taste, sore throat, right ear pain, vomited x 2 since last night.

## 2023-11-04 NOTE — ED Provider Notes (Signed)
 [facility]    CSN: [id number] Arrival date & time: 11/04/23  1120      History   Chief Complaint No chief complaint on file.   HPI Rachel Gates is a 68 y.o. female.   Presenting today with 1 day history of sore throat, nausea and vomiting, right ear pain, loss of taste.  Denies known fever, chills, cough, chest pain, shortness of breath, abdominal pain.  So far not trying anything over-the-counter for symptoms.  No known sick contacts recently.    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      Home Medications    Prior to Admission medications   Medication Sig Start Date End Date Taking? Authorizing Provider  amoxicillin (AMOXIL) 875 MG tablet Take 1 tablet (875 mg total) by mouth 2 (two) times daily. 11/04/23  Yes Particia Nearing, PA-C  cetirizine (ZYRTEC) 10 MG tablet Take 10 mg by mouth daily.   Yes [provider]  lidocaine (XYLOCAINE) 2 % solution Use as directed 10 mLs in the mouth or throat every 3 (three) hours as needed. 11/04/23  Yes Particia Nearing, PA-C  aspirin 81 MG EC tablet Take 81 mg by mouth daily.    [provider]   aspirin-acetaminophen-caffeine (EXCEDRIN MIGRAINE) [phone] MG tablet Take by mouth every 6 (six) hours as needed for headache.    [provider]  atenolol (TENORMIN) 25 MG tablet TAKE 1 TABLET BY MOUTH DAILY 08/11/23   Shelva Majestic, MD  calcium carbonate (OS-CAL) 600 MG TABS Take 600 mg by mouth 2 (two) times daily with a meal.    [provider]  citalopram (CELEXA) 10 MG tablet Take 1 tablet (10 mg total) by mouth daily. 08/11/23   Shelva Majestic, MD  fish oil-omega-3 fatty acids 1000 MG capsule Take 1 g by mouth daily.    [provider]  GEMTESA 75 MG TABS Take 1 tablet by mouth daily. 09/26/21   [provider]  LORazepam (ATIVAN) 0.5 MG tablet Take 1 tablet (0.5 mg total) by mouth 2 (two) times daily as needed for anxiety (do not drive for 8 hours after use or use within 8 hours of tramadol). 08/19/22   Shelva Majestic, MD  Multiple Vitamin (MULTIVITAMIN) tablet Take 1 tablet by mouth daily.    [provider]  ondansetron (ZOFRAN-ODT) 4 MG disintegrating tablet Take 1 tablet (4 mg total) by mouth every 8 (eight) hours as needed for nausea or vomiting. 06/12/23   Shelva Majestic, MD  simvastatin (ZOCOR) 40 MG tablet Take 1 tablet (40 mg total)  by mouth at bedtime. 08/11/23   Shelva Majestic, MD  vitamin B-12 (CYANOCOBALAMIN) 1000 MCG tablet Take 2,000 mcg by mouth daily.    [provider]  VITAMIN D PO Take by mouth.    [provider]    Family History Family History  Problem Relation Age of Onset   Hypertension Mother    Angina Mother        no stents   Uterine cancer Mother    Alzheimer's disease Mother        in facility since 2014-11-12. died sept 11-12-19   Heart attack Father        mid 77s. died at 67   Glaucoma Father    Lymphoma Sister        in spinal fluid- at [facility] and doing ok 2022-11-12    Social History Social History   Tobacco Use   Smoking status: Never   Smokeless tobacco: Never  Substance Use  Topics   Alcohol use: No   Drug use: No     Allergies   Prednisone   Review of Systems Review of Systems PER HPI  Physical Exam Triage Vital Signs ED Triage Vitals  Encounter Vitals Group     BP 11/04/23 1123 134/75     Systolic BP Percentile --      Diastolic BP Percentile --      Pulse Rate 11/04/23 1123 96     Resp 11/04/23 1123 18     Temp 11/04/23 1123 98.1 F (36.7 C)     Temp Source 11/04/23 1123 Oral     SpO2 11/04/23 1123 90 %     Weight --      Height --      Head Circumference --      Peak Flow --      Pain Score 11/04/23 1125 7     Pain Loc --      Pain Education --      Exclude from Growth Chart --    No data found.  Updated Vital Signs BP 134/75 (BP Location: Right Arm)   Pulse 96   Temp 98.1 F (36.7 C) (Oral)   Resp 18   LMP  (LMP Unknown)   SpO2 90%   Visual Acuity Right Eye Distance:   Left Eye Distance:   Bilateral Distance:    Right Eye Near:   Left Eye Near:    Bilateral Near:     Physical Exam Vitals and nursing note reviewed.  Constitutional:      Appearance: Normal appearance. She is not ill-appearing.  HENT:     Head: Atraumatic.     Right Ear: Tympanic membrane normal.     Left Ear: Tympanic membrane normal.     Nose: Nose normal.     Mouth/Throat:     Mouth: Mucous membranes are moist.     Pharynx: Posterior oropharyngeal erythema present. No oropharyngeal exudate.     Comments: Mild palatal petechiae Eyes:     Extraocular Movements: Extraocular movements intact.     Conjunctiva/sclera: Conjunctivae normal.  Cardiovascular:     Rate and Rhythm: Normal rate and regular rhythm.  Pulmonary:     Effort: Pulmonary effort is normal.     Breath sounds: Normal breath sounds.  Musculoskeletal:        General: Normal range of motion.     Cervical back: Normal range of motion and neck supple.  Lymphadenopathy:     Cervical: Cervical adenopathy present.  Skin:  General: Skin is warm and dry.  Neurological:      Mental Status: She is alert and oriented to person, place, and time.  Psychiatric:        Mood and Affect: Mood normal.        Thought Content: Thought content normal.        Judgment: Judgment normal.      UC Treatments / Results  Labs (all labs ordered are listed, but only abnormal results are displayed) Labs Reviewed  POCT RAPID STREP A (OFFICE) - Abnormal; Notable for the following components:      Result Value   Rapid Strep A Screen Positive (*)    All other components within normal limits  POC COVID19/FLU A&B COMBO    EKG   Radiology No results found.  Procedures Procedures (including critical care time)  Medications Ordered in UC Medications - No data to display  Initial Impression / Assessment and Plan / UC Course  I have reviewed the triage vital signs and the nursing notes.  Pertinent labs & imaging results that were available during my care of the patient were reviewed by me and considered in my medical decision making (see chart for details).     Rapid flu and COVID-negative, rapid strep positive.  Treat with Amoxil, viscous lidocaine, supportive over-the-counter medications and home care.  Return for worsening symptoms.  Final Clinical Impressions(s) / UC Diagnoses   Final diagnoses:  Strep tonsillitis     Discharge Instructions      Make sure to take the full course of antibiotics even once feeling better.  Change out your toothbrush after 1 to 2 days on the medication and wash any reusable water bottles or other items.  May take over-the-counter pain relievers as needed and I have sent in some numbing liquid for you to gargle as needed.    ED Prescriptions     Medication Sig Dispense Auth. Provider   amoxicillin (AMOXIL) 875 MG tablet Take 1 tablet (875 mg total) by mouth 2 (two) times daily. 20 tablet Particia Nearing, PA-C   lidocaine (XYLOCAINE) 2 % solution Use as directed 10 mLs in the mouth or throat every 3 (three) hours as  needed. 100 mL Particia Nearing, [place]      PDMP not reviewed this encounter.   Particia Nearing, [place] 11/04/23 1155

## 2023-11-09 DIAGNOSIS — M5416 Radiculopathy, lumbar region: Secondary | ICD-10-CM | POA: Diagnosis not present

## 2023-11-19 DIAGNOSIS — M5416 Radiculopathy, lumbar region: Secondary | ICD-10-CM | POA: Diagnosis not present

## 2023-11-24 ENCOUNTER — Telehealth: Payer: Self-pay | Admitting: Family Medicine

## 2023-11-24 NOTE — Telephone Encounter (Signed)
 Patient walked in with printed copies on lab work and an EKG performed at [facility]. Patient requested that these documents be uploaded into her chart. I have sent the results over to HIM to be placed in patient's chart.

## 2023-11-24 NOTE — Telephone Encounter (Signed)
 Noted.

## 2023-12-07 ENCOUNTER — Encounter: Payer: Self-pay | Admitting: Family Medicine

## 2023-12-22 ENCOUNTER — Encounter: Payer: Self-pay | Admitting: Family Medicine

## 2023-12-22 ENCOUNTER — Ambulatory Visit: Admitting: Family Medicine

## 2023-12-22 ENCOUNTER — Ambulatory Visit: Payer: Self-pay

## 2023-12-22 VITALS — BP 137/76 | HR 66 | Ht 67.0 in

## 2023-12-22 DIAGNOSIS — R21 Rash and other nonspecific skin eruption: Secondary | ICD-10-CM | POA: Diagnosis not present

## 2023-12-22 DIAGNOSIS — W57XXXA Bitten or stung by nonvenomous insect and other nonvenomous arthropods, initial encounter: Secondary | ICD-10-CM | POA: Diagnosis not present

## 2023-12-22 DIAGNOSIS — T148XXA Other injury of unspecified body region, initial encounter: Secondary | ICD-10-CM

## 2023-12-22 DIAGNOSIS — R350 Frequency of micturition: Secondary | ICD-10-CM | POA: Diagnosis not present

## 2023-12-22 MED ORDER — DOXYCYCLINE HYCLATE 100 MG PO TABS: 100.0000 mg | ORAL_TABLET | Freq: Two times a day (BID) | ORAL | 0 refills | Status: DC

## 2023-12-22 NOTE — Progress Notes (Signed)
 Rachel Gates is a 68 y.o. female who presents today for an office visit.  Assessment/Plan:  Rash Likely local inflammatory reaction due to her tick bite however may have some underlying cellulitis as well.  Will start course of doxycycline.  She can continue using topical antibiotic and went to the area as well.  Tick Bite / Foreign Body in Skin Patient believes that the tick was engorged and is not not sure how long it was present.  She did have a friend remove the tick at home today however unfortunately at mouthparts that were still retained.  We were not able to remove these with splinter forceps today.  We did discuss continue with watchful waiting with expectation that her skin would eventually extrude the mouthparts however she elected to have us  remove it via excision today.  See below procedure note.  She tolerated well.  We discussed wound care and care after.  Will be starting doxycycline as above for potential cellulitis however this will also treat for any potential tickborne illnesses as well.  We discussed reasons to return to care.    Subjective:  HPI:  See A/P for status of chronic conditions.  Patient is here today with tick bite.  She first noticed a tick this morning.  She called a neighbor to remove it which they were able today however still had some retained head and mouthparts at the site of the wound.  She is not sure how long the tick was attached however does note that it was engorged.  She has had more itching to the area.  She is not sure if she has had any rash or illness.  No obvious fevers or chills.  No other specific treatments tried       Objective:  Physical Exam: BP 137/76   Pulse 66   Ht 5\' 7"  (1.702 m)   LMP  (LMP Unknown)   SpO2 95%   BMI 35.24 kg/m   Gen: No acute distress, resting comfortably CV: Regular rate and rhythm with no murmurs appreciated Pulm: Normal work of breathing, clear to auscultation bilaterally with no crackles, wheezes, or  rhonchi Skin: Central upper back with approximately 1.5 cm erythematous indurated area with central retained foreign body consistent with tick mouthparts.  Neuro: Grossly normal, moves all extremities Psych: Normal affect and thought content   Procedure note Verbal consent was obtained to remove foreign body.  The above take mouthparts were attempted to be removed with splinter forceps in the office however due to the embedded nature they were not able to be successfully removed.  We then discussed watchful waiting with anticipation that her skin would gradually shut off the retained parts over the next several weeks versus removal with excision.  Patient elected for removal with excision.  We discussed risk and complications as well as alternatives as above.  Verbal consent was obtained for this as well.  The area was prepped with alcohol  swab.  Approximately 1.5 cc of lidocaine  with epinephrine was then infiltrated into the above area.  Area was then prepped with topical Betadine.  After adequate anesthesia was obtained, an incision was made with a derma blade scalpel to remove  the retained foreign body above in an area of approximately 8 x 8 mm.  Hemostasis was obtained via pressure and silver nitrate cautery.  The wound was then bandaged with topical antibiotic ointment and sterile gauze.  Skin sample was not sent for pathology.  She tolerated well.  Minimal blood  loss.  No complications.     Jinny Mounts. Daneil Dunker, MD 12/22/2023 12:44 PM

## 2023-12-22 NOTE — Telephone Encounter (Signed)
  Chief Complaint: Tick bit on back - tick still partially embedded Symptoms: redness - ring around area. irritation Frequency: Found today - unsure how long tick was there - fully engorged. Pertinent Negatives: Patient denies  Disposition: [] ED /[] Urgent Care (no appt availability in office) / [x] Appointment(In office/virtual)/ []  [facility]/ [] Home Care/ [] Refused Recommended Disposition /[] [place] [facility]/ []  Follow-up with PCP Additional Notes: Pt found a tick on her back. Neighbor tried to remove and left some of it in her skin. Area is red with a ring around it. Appt for this morning. Pt was unable to take appt with PCP due to scheduling conflict.  Copied from CRM [id number]. Topic: Clinical - Red Word Triage >> Dec 22, 2023  8:13 AM Elle L wrote: Red Word that prompted transfer to Nurse Triage: The patient states she found a tick embedded in the center of her back and that it is red and irritated. Her neighbor is a Engineer, civil (consulting) and was able to remove some of it but not all of it. Reason for Disposition  Red ring or bull's-eye rash occurs at tick bite  Answer Assessment - Initial Assessment Questions 1. ATTACHED:  "Is the tick still on the skin?"  (e.g., yes, no, unsure)     No - removed most of it. 2. ONSET - TICK STILL ATTACHED:  "How long do you think the tick has been on your skin?" (e.g., hours, days, unsure)  Note:  Is there a recent activity (camping, hiking) where the caller may have been exposed?     no 3. ONSET - TICK NOT STILL ATTACHED: "If the tick has been removed, how long do you think the tick was attached before you removed it?" (e.g., 5 hours, 2 days). "When was this?"     A while 4. LOCATION: "Where is the tick bite located?" (e.g., arm, leg)     Middle of back 5. TYPE of TICK: "Is it a wood tick or a deer tick?" (e.g., deer tick, wood tick; unsure)     White something. White spot in the middle of it's back 6. SIZE of TICK: "How big is the tick?" (e.g., size  of poppy seed, apple seed, watermelon seed; unsure) Note: Deer ticks can be the size of a poppy seed (nymph) or an apple seed (adult).       Pretty big 7. ENGORGED: "Did the tick look flat or engorged (full, swollen)?" (e.g., flat, engorged; unsure)     yes 8. OTHER SYMPTOMS: "Do you have any other symptoms?" (e.g., fever, rash, redness at bite area, red ring around bite)     Irritation, redness.  Protocols used: Tick Bite-A-AH

## 2023-12-22 NOTE — Patient Instructions (Signed)
 It was very nice to see you today!  We removed the foreign body from your back.  Please keep the area clean and dry.  Please start the doxycycline.  Let us  know if you have any issues.  This should heal up over the next several days.  No follow-ups on file.   Take care, Dr Daneil Dunker  PLEASE NOTE:  If you had any lab tests, please let us  know if you have not heard back within a few days. You may see your results on mychart before we have a chance to review them but we will give you a call once they are reviewed by us .   If we ordered any referrals today, please let us  know if you have not heard from their office within the next week.   If you had any urgent prescriptions sent in today, please check with the pharmacy within an hour of our visit to make sure the prescription was transmitted appropriately.   Please try these tips to maintain a healthy lifestyle:  Eat at least 3 REAL meals and 1-2 snacks per day.  Aim for no more than 5 hours between eating.  If you eat breakfast, please do so within one hour of getting up.   Each meal should contain half fruits/vegetables, one quarter protein, and one quarter carbs (no bigger than a computer mouse)  Cut down on sweet beverages. This includes juice, soda, and sweet tea.   Drink at least 1 glass of water with each meal and aim for at least 8 glasses per day  Exercise at least 150 minutes every week.

## 2024-01-05 DIAGNOSIS — I10 Essential (primary) hypertension: Secondary | ICD-10-CM | POA: Diagnosis not present

## 2024-01-07 ENCOUNTER — Encounter: Payer: Self-pay | Admitting: Family Medicine

## 2024-01-07 DIAGNOSIS — M5416 Radiculopathy, lumbar region: Secondary | ICD-10-CM | POA: Diagnosis not present

## 2024-02-08 ENCOUNTER — Ambulatory Visit: Payer: Medicare Other | Admitting: Family Medicine

## 2024-02-13 ENCOUNTER — Ambulatory Visit
Admission: EM | Admit: 2024-02-13 | Discharge: 2024-02-13 | Disposition: A | Discharge status: Home / Self Care | Attending: Nurse Practitioner | Admitting: Nurse Practitioner

## 2024-02-13 DIAGNOSIS — J22 Unspecified acute lower respiratory infection: Secondary | ICD-10-CM | POA: Diagnosis not present

## 2024-02-13 DIAGNOSIS — R059 Cough, unspecified: Secondary | ICD-10-CM | POA: Diagnosis not present

## 2024-02-13 MED ORDER — ALBUTEROL SULFATE HFA 108 (90 BASE) MCG/ACT IN AERS: 2.0000 | INHALATION_SPRAY | Freq: Four times a day (QID) | RESPIRATORY_TRACT | 0 refills | Status: DC | PRN

## 2024-02-13 MED ORDER — IPRATROPIUM-ALBUTEROL 0.5-2.5 (3) MG/3ML IN SOLN
3.0000 mL | Freq: Once | RESPIRATORY_TRACT | Status: AC
  Administered 2024-02-13: 3 mL via RESPIRATORY_TRACT

## 2024-02-13 MED ORDER — METHYLPREDNISOLONE SODIUM SUCC 125 MG IJ SOLR
80.0000 mg | Freq: Once | INTRAMUSCULAR | Status: AC
  Administered 2024-02-13: 80 mg via INTRAMUSCULAR

## 2024-02-13 MED ORDER — AZITHROMYCIN 250 MG PO TABS: 250.0000 mg | ORAL_TABLET | Freq: Every day | ORAL | 0 refills | Status: DC

## 2024-02-13 MED ORDER — PROMETHAZINE-DM 6.25-15 MG/5ML PO SYRP: 5.0000 mL | ORAL_SOLUTION | Freq: Four times a day (QID) | ORAL | 0 refills | Status: DC | PRN

## 2024-02-13 NOTE — ED Provider Notes (Signed)
 [facility]    CSN: [id number] Arrival date & time: 02/13/24  1155      History   Chief Complaint Chief Complaint  Patient presents with   Cough    HPI Rachel Gates is a 68 y.o. female.   The history is provided by the patient.   Patient presents for complaints of cough, wheezing, and chest congestion for the past week.  Patient states over the past several days, the cough and wheezing have worsened.  She states that she coughs persistently throughout the day.  She denies fever, chills, headache, nasal congestion, runny nose, chest pain, abdominal pain, nausea, vomiting, diarrhea, or rash.  Patient states that she did go on a cruise prior to her symptoms starting.  States she has been taking Delsym and Robitussin with minimal relief.  She denies prior history of asthma, smoking, or COPD.  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      Home Medications    Prior to Admission medications   Medication Sig Start Date End Date Taking? Authorizing Provider  aspirin  81 MG EC tablet Take 81 mg by mouth daily.    [provider]  aspirin -acetaminophen -caffeine (EXCEDRIN MIGRAINE) 250-250-65 MG  tablet Take by mouth every 6 (six) hours as needed for headache.    [provider]  atenolol  (TENORMIN ) 25 MG tablet TAKE 1 TABLET BY MOUTH DAILY 08/11/23   Katrinka Garnette KIDD, MD  calcium carbonate (OS-CAL) 600 MG TABS Take 600 mg by mouth 2 (two) times daily with a meal.    [provider]  cetirizine (ZYRTEC) 10 MG tablet Take 10 mg by mouth daily.    [provider]  citalopram  (CELEXA ) 10 MG tablet Take 1 tablet (10 mg total) by mouth daily. 08/11/23   Katrinka Garnette KIDD, MD  doxycycline  (VIBRA -TABS) 100 MG tablet Take 1 tablet (100 mg total) by mouth 2 (two) times daily. 12/22/23   Kennyth Worth HERO, MD  fish oil-omega-3 fatty acids 1000 MG capsule Take 1 g by mouth daily.    [provider]  GEMTESA 75 MG TABS Take 1 tablet by mouth daily. 09/26/21   [provider]  levocetirizine (XYZAL) 5 MG tablet Take 5 mg by mouth every evening.    [provider]  LORazepam  (ATIVAN ) 0.5 MG tablet Take 1 tablet (0.5 mg total) by mouth 2 (two) times daily as needed for anxiety (do not drive for 8 hours after use or use within 8 hours of tramadol ). 08/19/22   Katrinka Garnette KIDD, MD  Multiple Vitamin (MULTIVITAMIN) tablet Take 1 tablet by mouth daily.  [provider]  ondansetron  (ZOFRAN -ODT) 4 MG disintegrating tablet Take 1 tablet (4 mg total) by mouth every 8 (eight) hours as needed for nausea or vomiting. 06/12/23   Katrinka Garnette KIDD, MD  simvastatin  (ZOCOR ) 40 MG tablet Take 1 tablet (40 mg total) by mouth at bedtime. 08/11/23   Katrinka Garnette KIDD, MD  vitamin B-12 (CYANOCOBALAMIN ) 1000 MCG tablet Take 2,000 mcg by mouth daily.    [provider]  VITAMIN D PO Take by mouth.    [provider]    Family History Family History  Problem Relation Age of Onset   Hypertension Mother    Angina Mother        no stents   Uterine cancer Mother    Alzheimer's disease Mother        in facility since 2015-03-22. died sept Mar 21, 2020   Heart  attack Father        mid 24s. died at 50   Glaucoma Father    Lymphoma Sister        in spinal fluid- at [facility] and doing ok 22-Mar-2023    Social History Social History   Tobacco Use   Smoking status: Never   Smokeless tobacco: Never  Substance Use Topics   Alcohol  use: No   Drug use: No     Allergies   Prednisone    Review of Systems Review of Systems Per HPI  Physical Exam Triage Vital Signs ED Triage Vitals  Encounter Vitals Group     BP 02/13/24 1224 137/81     Girls Systolic BP Percentile --      Girls Diastolic BP Percentile --      Boys Systolic BP Percentile --      Boys Diastolic BP Percentile --      Pulse Rate 02/13/24 1224 69     Resp 02/13/24 1224 16     Temp 02/13/24 1224 98.5 F (36.9 C)     Temp Source 02/13/24 1224 Oral     SpO2 02/13/24 1224 94 %     Weight --      Height --      Head Circumference --      Peak Flow --      Pain Score 02/13/24 1223 0     Pain Loc --      Pain Education --      Exclude from Growth Chart --    No data found.  Updated Vital Signs BP 137/81 (BP Location: Right Arm)   Pulse 69   Temp 98.5 F (36.9 C) (Oral)   Resp 16   LMP  (LMP Unknown)   SpO2 94%   Visual Acuity Right Eye Distance:   Left Eye Distance:   Bilateral Distance:    Right Eye Near:   Left Eye Near:    Bilateral Near:     Physical Exam Vitals and nursing note reviewed.  Constitutional:      General: She is not in acute distress.    Appearance: Normal appearance.  HENT:     Head: Normocephalic.  Cardiovascular:     Rate and Rhythm: Normal rate and regular rhythm.     Pulses: Normal pulses.     Heart sounds: Normal heart sounds.  Pulmonary:     Effort: Pulmonary effort is normal. No respiratory distress.     Breath sounds: No stridor. Examination of the right-upper field reveals wheezing and rhonchi. Examination of the left-upper field reveals wheezing and rhonchi. Examination of the right-lower  field reveals rhonchi. Examination of the  left-lower field reveals wheezing and rhonchi. Wheezing and rhonchi present. No rales.     Comments: Post DuoNeb, complete resolution of wheezing and rhonchi.  Patient reports I am feeling so much better. Abdominal:     General: Bowel sounds are normal.     Palpations: Abdomen is soft.     Tenderness: There is no abdominal tenderness.  Skin:    General: Skin is warm and dry.  Neurological:     General: No focal deficit present.     Mental Status: She is alert and oriented to person, place, and time.  Psychiatric:        Mood and Affect: Mood normal.        Behavior: Behavior normal.      UC Treatments / Results  Labs (all labs ordered are listed, but only abnormal results are displayed) Labs Reviewed - No data to display  EKG   Radiology No results found.  Procedures Procedures (including critical care time)  Medications Ordered in UC Medications - No data to display  Initial Impression / Assessment and Plan / UC Course  I have reviewed the triage vital signs and the nursing notes.  Pertinent labs & imaging results that were available during my care of the patient were reviewed by me and considered in my medical decision making (see chart for details).  On initial exam, patient with wheezing and rhonchi noted throughout, baseline sats at 94%.  DuoNeb was administered, post DuoNeb, complete resolution of wheezing and rhonchi with improvement of O2 saturation at 98%.  Will forego viral testing at this time as this will not change the course of treatment.  Will also defer imaging as lung sounds improved significantly after DuoNeb.  Will cover for lower respiratory infection with azithromycin  250 mg, for the cough, Promethazine  DM was prescribed, and an albuterol  inhaler prescribed for wheezing.  Supportive care recommendations were provided and discussed with the patient to include fluids, rest, over-the-counter analgesics, and use of a humidifier at nighttime during sleep.   Discussed indications with patient regarding follow-up.  Patient was in agreement with this plan of care and verbalizes understanding.  All questions were answered.  Patient stable for discharge.   Final Clinical Impressions(s) / UC Diagnoses   Final diagnoses:  None   Discharge Instructions   None    ED Prescriptions   None    PDMP not reviewed this encounter.   Gilmer Etta PARAS, NP 02/13/24 1258

## 2024-02-13 NOTE — ED Triage Notes (Signed)
 Pt reports she has a cough, wheezing, and some congestion x 1 week    Took robitussin and tylenol  cold and flu

## 2024-02-13 NOTE — Discharge Instructions (Signed)
 You were given an injection of Solu-Medrol  125 mg today and a duo nebulizer treatment. Take medication as prescribed.  You may continue Delsym or Robitussin during the daytime for your cough. Increase fluids and allow for plenty of rest. You may take over-the-counter Tylenol  as needed for pain, fever, or general discomfort. Recommend use of a humidifier in your bedroom at nighttime during sleep and sleeping elevated on pillows while cough symptoms persist. As discussed, your cough may last from days to weeks.  If you are generally feeling well but continued to have a persistent nagging cough, continue over-the-counter cough and cold medications, increasing your fluids, and cough drops.  Seek care if you develop fever, chills, wheezing, shortness of breath, or other concerns. Follow-up as needed.

## 2024-02-16 DIAGNOSIS — M5416 Radiculopathy, lumbar region: Secondary | ICD-10-CM | POA: Diagnosis not present

## 2024-02-16 DIAGNOSIS — M4316 Spondylolisthesis, lumbar region: Secondary | ICD-10-CM | POA: Diagnosis not present

## 2024-02-17 ENCOUNTER — Ambulatory Visit: Admitting: Family Medicine

## 2024-02-25 DIAGNOSIS — M48061 Spinal stenosis, lumbar region without neurogenic claudication: Secondary | ICD-10-CM | POA: Diagnosis not present

## 2024-02-25 DIAGNOSIS — M4186 Other forms of scoliosis, lumbar region: Secondary | ICD-10-CM | POA: Diagnosis not present

## 2024-02-25 DIAGNOSIS — M5116 Intervertebral disc disorders with radiculopathy, lumbar region: Secondary | ICD-10-CM | POA: Diagnosis not present

## 2024-02-25 DIAGNOSIS — M4316 Spondylolisthesis, lumbar region: Secondary | ICD-10-CM | POA: Diagnosis not present

## 2024-02-25 DIAGNOSIS — M5416 Radiculopathy, lumbar region: Secondary | ICD-10-CM | POA: Diagnosis not present

## 2024-03-10 DIAGNOSIS — M4316 Spondylolisthesis, lumbar region: Secondary | ICD-10-CM | POA: Diagnosis not present

## 2024-03-10 DIAGNOSIS — M5416 Radiculopathy, lumbar region: Secondary | ICD-10-CM | POA: Diagnosis not present

## 2024-03-16 ENCOUNTER — Other Ambulatory Visit: Payer: Self-pay | Admitting: Neurological Surgery

## 2024-03-22 DIAGNOSIS — M4316 Spondylolisthesis, lumbar region: Secondary | ICD-10-CM | POA: Diagnosis not present

## 2024-03-29 NOTE — Pre-Procedure Instructions (Signed)
 Surgical Instructions    Your procedure is scheduled on Friday, August, 29th  Report to [facility] Main Entrance A at 0945 A.M., then check in with the Admitting office.  Call this number if you have problems the morning of surgery:  [phone]  If you have any questions prior to your surgery date call [phone]: Open Monday-Friday 8am-4pm If you experience any cold or flu symptoms such as cough, fever, chills, shortness of breath, etc. between now and your scheduled surgery, please notify us  at the above number.     Remember:  Do not eat after midnight the night before your surgery  You may drink clear liquids until 0845 the morning of your surgery.   Clear liquids allowed are: Water, Non-Citrus Juices (without pulp), Carbonated Beverages, Clear Tea, Black Coffee Only (NO MILK, CREAM OR POWDERED CREAMER of any kind), and Gatorade.  Patient Instructions  The night before surgery:  No food after midnight. ONLY clear liquids after midnight  The day of surgery (if you do NOT have diabetes):  Drink ONE (1) Pre-Surgery Clear Ensure by 0845 the morning of surgery. Drink in one sitting. Do not sip.  This drink was given to you during your hospital  pre-op appointment visit.  Nothing else to drink after completing the  Pre-Surgery Clear Ensure.      Take these medicines the morning of surgery with A SIP OF WATER:              atenolol  (TENORMIN )             citalopram  (CELEXA )             GEMTESA             ondansetron  (ZOFRAN -ODT) as needed   Please follow your surgeon's instructions on when to stop Aspirin . If no instructions are given - please call dr. Joshua' office.    As of today, STOP taking any Aleve, Naproxen, Ibuprofen, Motrin, Advil, Goody's, BC's, all herbal medications, fish oil, and all vitamins.                     Do NOT Smoke (Tobacco/Vaping) for 24 hours prior to your procedure.  If you use a CPAP at night, you may bring your mask/headgear for your  overnight stay.   Contacts, glasses, piercing's, hearing aid's, dentures or partials may not be worn into surgery, please bring cases for these belongings.    For patients admitted to the hospital, discharge time will be determined by your treatment team.   Patients discharged the day of surgery will not be allowed to drive home, and someone needs to stay with them for 24 hours.  SURGICAL WAITING ROOM VISITATION Patients having surgery or a procedure may have no more than 2 support people in the waiting area - these visitors may rotate.   Children under the age of 48 must have an adult with them who is not the patient. If the patient needs to stay at the hospital during part of their recovery, the visitor guidelines for inpatient rooms apply. Pre-op nurse will coordinate an appropriate time for 1 support person to accompany patient in pre-op.  This support person may not rotate.   Please refer to the [facility] website for the visitor guidelines for Inpatients (after your surgery is over and you are in a regular room).    Special instructions:     Pre-operative 5 CHG Bath Instructions   You can play a key role in  reducing the risk of infection after surgery. Your skin needs to be as free of germs as possible. You can reduce the number of germs on your skin by washing with CHG (chlorhexidine gluconate) soap before surgery. CHG is an antiseptic soap that kills germs and continues to kill germs even after washing.   DO NOT use if you have an allergy to chlorhexidine/CHG or antibacterial soaps. If your skin becomes reddened or irritated, stop using the CHG and notify one of our RNs at [phone].   Please shower with the CHG soap starting 4 days before surgery using the following schedule:     Please keep in mind the following:  DO NOT shave, including legs and underarms, starting the day of your first shower.   You may shave your face at any point before/day of surgery.  Place clean  sheets on your bed the day you start using CHG soap. Use a clean washcloth (not used since being washed) for each shower. DO NOT sleep with pets once you start using the CHG.   CHG Shower Instructions:  If you choose to wash your hair and private area, wash first with your normal shampoo/soap.  After you use shampoo/soap, rinse your hair and body thoroughly to remove shampoo/soap residue.  Turn the water OFF and apply about 3 tablespoons (45 ml) of CHG soap to a CLEAN washcloth.  Apply CHG soap ONLY FROM YOUR NECK DOWN TO YOUR TOES (washing for 3-5 minutes)  DO NOT use CHG soap on face, private areas, open wounds, or sores.  Pay special attention to the area where your surgery is being performed.  If you are having back surgery, having someone wash your back for you may be helpful. Wait 2 minutes after CHG soap is applied, then you may rinse off the CHG soap.  Pat dry with a clean towel  Put on clean clothes/pajamas   If you choose to wear lotion, please use ONLY the CHG-compatible lotions on the back of this paper.     Additional instructions for the day of surgery: DO NOT APPLY any lotions, deodorants, cologne, or perfumes.   Put on clean/comfortable clothes.  Brush your teeth.  Ask your nurse before applying any prescription medications to the skin.      CHG Compatible Lotions   Aveeno Moisturizing lotion  Cetaphil Moisturizing Cream  Cetaphil Moisturizing Lotion  Clairol Herbal Essence Moisturizing Lotion, Dry Skin  Clairol Herbal Essence Moisturizing Lotion, Extra Dry Skin  Clairol Herbal Essence Moisturizing Lotion, Normal Skin  Curel Age Defying Therapeutic Moisturizing Lotion with Alpha Hydroxy  Curel Extreme Care Body Lotion  Curel Soothing Hands Moisturizing Hand Lotion  Curel Therapeutic Moisturizing Cream, Fragrance-Free  Curel Therapeutic Moisturizing Lotion, Fragrance-Free  Curel Therapeutic Moisturizing Lotion, Original Formula  Eucerin Daily Replenishing  Lotion  Eucerin Dry Skin Therapy Plus Alpha Hydroxy Crme  Eucerin Dry Skin Therapy Plus Alpha Hydroxy Lotion  Eucerin Original Crme  Eucerin Original Lotion  Eucerin Plus Crme Eucerin Plus Lotion  Eucerin TriLipid Replenishing Lotion  Keri Anti-Bacterial Hand Lotion  Keri Deep Conditioning Original Lotion Dry Skin Formula Softly Scented  Keri Deep Conditioning Original Lotion, Fragrance Free Sensitive Skin Formula  Keri Lotion Fast Absorbing Fragrance Free Sensitive Skin Formula  Keri Lotion Fast Absorbing Softly Scented Dry Skin Formula  Keri Original Lotion  Keri Skin Renewal Lotion Keri Silky Smooth Lotion  Keri Silky Smooth Sensitive Skin Lotion  Nivea Body Creamy Conditioning Oil  Nivea Body Extra Enriched Education administrator  Original Market researcher Moisturizing Lotion Nivea Crme  Nivea Skin Firming Lotion  NutraDerm 30 Skin Lotion  NutraDerm Skin Lotion  NutraDerm Therapeutic Skin Cream  NutraDerm Therapeutic Skin Lotion  ProShield Protective Hand Cream  Provon moisturizing lotion

## 2024-03-30 ENCOUNTER — Encounter (HOSPITAL_COMMUNITY): Payer: Self-pay

## 2024-03-30 ENCOUNTER — Encounter (HOSPITAL_COMMUNITY)
Admission: RE | Admit: 2024-03-30 | Discharge: 2024-03-30 | Disposition: A | Discharge status: Home / Self Care | Source: Ambulatory Visit | Attending: Neurological Surgery | Admitting: Neurological Surgery

## 2024-03-30 ENCOUNTER — Other Ambulatory Visit: Payer: Self-pay

## 2024-03-30 VITALS — BP 128/71 | HR 65 | Temp 98.6°F | Resp 16 | Ht 67.0 in | Wt 236.3 lb

## 2024-03-30 DIAGNOSIS — Z01818 Encounter for other preprocedural examination: Secondary | ICD-10-CM | POA: Insufficient documentation

## 2024-03-30 HISTORY — DX: Other specified postprocedural states: Z98.890

## 2024-03-30 HISTORY — DX: Nausea with vomiting, unspecified: R11.2

## 2024-03-30 HISTORY — DX: Headache, unspecified: R51.9

## 2024-03-30 LAB — BASIC METABOLIC PANEL WITH GFR
Anion gap: 10 (ref 5–15)
BUN: 18 mg/dL (ref 8–23)
CO2: 28 mmol/L (ref 22–32)
Calcium: 9.7 mg/dL (ref 8.9–10.3)
Chloride: 103 mmol/L (ref 98–111)
Creatinine, Ser: 0.66 mg/dL (ref 0.44–1.00)
GFR, Estimated: >60 mL/min (ref >60)
Glucose, Bld: 96 mg/dL (ref 70–99)
Potassium: 4 mmol/L (ref 3.5–5.1)
Sodium: 141 mmol/L (ref 135–145)

## 2024-03-30 LAB — SURGICAL PCR SCREEN
MRSA, PCR: NEGATIVE
Staphylococcus aureus: NEGATIVE

## 2024-03-30 LAB — CBC
HCT: 46 % (ref 36.0–46.0)
Hemoglobin: 15.1 g/dL — ABNORMAL HIGH (ref 12.0–15.0)
MCH: 31.3 pg (ref 26.0–34.0)
MCHC: 32.8 g/dL (ref 30.0–36.0)
MCV: 95.4 fL (ref 80.0–100.0)
Platelets: 274 K/uL (ref 150–400)
RBC: 4.82 MIL/uL (ref 3.87–5.11)
RDW: 12.7 % (ref 11.5–15.5)
WBC: 8.2 K/uL (ref 4.0–10.5)
nRBC: 0 % (ref 0.0–0.2)

## 2024-03-30 LAB — TYPE AND SCREEN
ABO/RH(D): O POS
Antibody Screen: NEGATIVE

## 2024-03-30 NOTE — Progress Notes (Signed)
 PCP -Katrinka Garnette KIDD, MD  Cardiologist -   PPM/ICD - denies Device Orders - n/a Rep Notified - n/a  Chest x-ray - denies EKG - 08-13-23 Stress Test - denies ECHO - 09-12-2011 Cardiac Cath - denies  Sleep Study - denies CPAP - n/a  Dm-denies  Blood Thinner Instructions:denies Aspirin  Instructions: Last dose 03-25-24  ERAS Protcol - clear liquids until 8:45 am. PRE-SURGERY Ensure or G2-   COVID TEST- n/a   Anesthesia review: no  Patient denies shortness of breath, fever, cough and chest pain at PAT appointment   All instructions explained to the patient, with a verbal understanding of the material. Patient agrees to go over the instructions while at home for a better understanding. Patient also instructed to self quarantine after being tested for COVID-19. The opportunity to ask questions was provided.

## 2024-03-30 NOTE — Progress Notes (Addendum)
 Surgical Instructions                 Your procedure is scheduled on Friday, August, 29th             Report to [facility] Main Entrance A at 0945 A.M., then check in with the Admitting office.             Call this number if you have problems the morning of surgery:             [phone]   If you have any questions prior to your surgery date call [phone]: Open Monday-Friday 8am-4pm If you experience any cold or flu symptoms such as cough, fever, chills, shortness of breath, etc. between now and your scheduled surgery, please notify us  at the above number.                  Remember:             Do not eat after midnight the night before your surgery   You may drink clear liquids until 0845 the morning of your surgery.   Clear liquids allowed are: Water, Non-Citrus Juices (without pulp), Carbonated Beverages, Clear Tea, Black Coffee Only (NO MILK, CREAM OR POWDERED CREAMER of any kind), and Gatorade.                           Take these medicines the morning of surgery with A SIP OF WATER:               atenolol  (TENORMIN )             citalopram  (CELEXA )             GEMTESA             ondansetron  (ZOFRAN -ODT) as needed    Please follow your surgeon's instructions on when to stop Aspirin . If no instructions are given - please call dr. Joshua' office.     As of today, STOP taking any Aleve, Naproxen, Ibuprofen, Motrin, Advil, Goody's, BC's, all herbal medications, fish oil, and all vitamins.                     Do NOT Smoke (Tobacco/Vaping) for 24 hours prior to your procedure.   If you use a CPAP at night, you may bring your mask/headgear for your overnight stay.   Contacts, glasses, piercing's, hearing aid's, dentures or partials may not be worn into surgery, please bring cases for these belongings.    For patients admitted to the hospital, discharge time will be determined by your treatment team.   Patients discharged the day of surgery will not be allowed to drive  home, and someone needs to stay with them for 24 hours.   SURGICAL WAITING ROOM VISITATION Patients having surgery or a procedure may have no more than 2 support people in the waiting area - these visitors may rotate.   Children under the age of 63 must have an adult with them who is not the patient. If the patient needs to stay at the hospital during part of their recovery, the visitor guidelines for inpatient rooms apply. Pre-op nurse will coordinate an appropriate time for 1 support person to accompany patient in pre-op.  This support person may not rotate.    Please refer to the [facility] website for the visitor guidelines for Inpatients (after your surgery is over and you are in a regular  room).      Special instructions:      Pre-operative 5 CHG Bath Instructions    You can play a key role in reducing the risk of infection after surgery. Your skin needs to be as free of germs as possible. You can reduce the number of germs on your skin by washing with CHG (chlorhexidine gluconate) soap before surgery. CHG is an antiseptic soap that kills germs and continues to kill germs even after washing.    DO NOT use if you have an allergy to chlorhexidine/CHG or antibacterial soaps. If your skin becomes reddened or irritated, stop using the CHG and notify one of our RNs at [phone].    Please shower with the CHG soap starting 4 days before surgery using the following schedule:       Please keep in mind the following:  DO NOT shave, including legs and underarms, starting the day of your first shower.   You may shave your face at any point before/day of surgery.  Place clean sheets on your bed the day you start using CHG soap. Use a clean washcloth (not used since being washed) for each shower. DO NOT sleep with pets once you start using the CHG.    CHG Shower Instructions:  If you choose to wash your hair and private area, wash first with your normal shampoo/soap.  After you use  shampoo/soap, rinse your hair and body thoroughly to remove shampoo/soap residue.  Turn the water OFF and apply about 3 tablespoons (45 ml) of CHG soap to a CLEAN washcloth.  Apply CHG soap ONLY FROM YOUR NECK DOWN TO YOUR TOES (washing for 3-5 minutes)  DO NOT use CHG soap on face, private areas, open wounds, or sores.  Pay special attention to the area where your surgery is being performed.  If you are having back surgery, having someone wash your back for you may be helpful. Wait 2 minutes after CHG soap is applied, then you may rinse off the CHG soap.  Pat dry with a clean towel  Put on clean clothes/pajamas   If you choose to wear lotion, please use ONLY the CHG-compatible lotions on the back of this paper.     Additional instructions for the day of surgery: DO NOT APPLY any lotions, deodorants, cologne, or perfumes.   Put on clean/comfortable clothes.  Brush your teeth.  Ask your nurse before applying any prescription medications to the skin.          CHG Compatible Lotions    Aveeno Moisturizing lotion  Cetaphil Moisturizing Cream  Cetaphil Moisturizing Lotion  Clairol Herbal Essence Moisturizing Lotion, Dry Skin  Clairol Herbal Essence Moisturizing Lotion, Extra Dry Skin  Clairol Herbal Essence Moisturizing Lotion, Normal Skin  Curel Age Defying Therapeutic Moisturizing Lotion with Alpha Hydroxy  Curel Extreme Care Body Lotion  Curel Soothing Hands Moisturizing Hand Lotion  Curel Therapeutic Moisturizing Cream, Fragrance-Free  Curel Therapeutic Moisturizing Lotion, Fragrance-Free  Curel Therapeutic Moisturizing Lotion, Original Formula  Eucerin Daily Replenishing Lotion  Eucerin Dry Skin Therapy Plus Alpha Hydroxy Crme  Eucerin Dry Skin Therapy Plus Alpha Hydroxy Lotion  Eucerin Original Crme  Eucerin Original Lotion  Eucerin Plus Crme Eucerin Plus Lotion  Eucerin TriLipid Replenishing Lotion  Keri Anti-Bacterial Hand Lotion  Keri Deep Conditioning Original  Lotion Dry Skin Formula Softly Scented  Keri Deep Conditioning Original Lotion, Fragrance Free Sensitive Skin Formula  Keri Lotion Fast Absorbing Fragrance Free Sensitive Skin Formula  Keri Lotion Fast Absorbing Softly Scented  Dry Skin Formula  Keri Original Lotion  Keri Skin Renewal Lotion Keri Silky Smooth Lotion  Keri Silky Smooth Sensitive Skin Lotion  Nivea Body Creamy Conditioning Oil  Nivea Body Extra Enriched Teacher, adult education Moisturizing Lotion Nivea Crme  Nivea Skin Firming Lotion  NutraDerm 30 Skin Lotion  NutraDerm Skin Lotion  NutraDerm Therapeutic Skin Cream  NutraDerm Therapeutic Skin Lotion  ProShield Protective Hand Cream  Provon moisturizing lotion

## 2024-04-08 ENCOUNTER — Inpatient Hospital Stay (HOSPITAL_COMMUNITY)
Admission: RE | Admit: 2024-04-08 | Discharge: 2024-04-09 | DRG: 427 | Disposition: A | Discharge status: Home / Self Care | Attending: Neurological Surgery | Admitting: Neurological Surgery

## 2024-04-08 ENCOUNTER — Other Ambulatory Visit: Payer: Self-pay

## 2024-04-08 ENCOUNTER — Inpatient Hospital Stay (HOSPITAL_COMMUNITY): Admitting: Anesthesiology

## 2024-04-08 ENCOUNTER — Encounter (HOSPITAL_COMMUNITY): Payer: Self-pay | Admitting: Neurological Surgery

## 2024-04-08 ENCOUNTER — Ambulatory Visit: Admitting: Family Medicine

## 2024-04-08 ENCOUNTER — Inpatient Hospital Stay (HOSPITAL_COMMUNITY)

## 2024-04-08 ENCOUNTER — Encounter (HOSPITAL_COMMUNITY)
Admission: RE | Disposition: A | Discharge status: Home / Self Care | Payer: Self-pay | Source: Home / Self Care | Attending: Neurological Surgery

## 2024-04-08 DIAGNOSIS — F32A Depression, unspecified: Secondary | ICD-10-CM | POA: Diagnosis present

## 2024-04-08 DIAGNOSIS — F419 Anxiety disorder, unspecified: Secondary | ICD-10-CM | POA: Diagnosis present

## 2024-04-08 DIAGNOSIS — E669 Obesity, unspecified: Secondary | ICD-10-CM | POA: Diagnosis present

## 2024-04-08 DIAGNOSIS — G9741 Accidental puncture or laceration of dura during a procedure: Secondary | ICD-10-CM | POA: Diagnosis not present

## 2024-04-08 DIAGNOSIS — M419 Scoliosis, unspecified: Secondary | ICD-10-CM | POA: Diagnosis present

## 2024-04-08 DIAGNOSIS — I10 Essential (primary) hypertension: Secondary | ICD-10-CM | POA: Diagnosis not present

## 2024-04-08 DIAGNOSIS — M4316 Spondylolisthesis, lumbar region: Secondary | ICD-10-CM | POA: Diagnosis not present

## 2024-04-08 DIAGNOSIS — Z79899 Other long term (current) drug therapy: Secondary | ICD-10-CM

## 2024-04-08 DIAGNOSIS — E785 Hyperlipidemia, unspecified: Secondary | ICD-10-CM | POA: Diagnosis present

## 2024-04-08 DIAGNOSIS — Z6837 Body mass index (BMI) 37.0-37.9, adult: Secondary | ICD-10-CM

## 2024-04-08 DIAGNOSIS — Z981 Arthrodesis status: Secondary | ICD-10-CM | POA: Diagnosis not present

## 2024-04-08 DIAGNOSIS — Z820 Family history of epilepsy and other diseases of the nervous system: Secondary | ICD-10-CM | POA: Diagnosis not present

## 2024-04-08 DIAGNOSIS — Z888 Allergy status to other drugs, medicaments and biological substances status: Secondary | ICD-10-CM | POA: Diagnosis not present

## 2024-04-08 DIAGNOSIS — F418 Other specified anxiety disorders: Secondary | ICD-10-CM

## 2024-04-08 DIAGNOSIS — Z83511 Family history of glaucoma: Secondary | ICD-10-CM | POA: Diagnosis not present

## 2024-04-08 DIAGNOSIS — Z8249 Family history of ischemic heart disease and other diseases of the circulatory system: Secondary | ICD-10-CM | POA: Diagnosis not present

## 2024-04-08 DIAGNOSIS — Y838 Other surgical procedures as the cause of abnormal reaction of the patient, or of later complication, without mention of misadventure at the time of the procedure: Secondary | ICD-10-CM | POA: Diagnosis not present

## 2024-04-08 DIAGNOSIS — Z7982 Long term (current) use of aspirin: Secondary | ICD-10-CM

## 2024-04-08 DIAGNOSIS — M48061 Spinal stenosis, lumbar region without neurogenic claudication: Secondary | ICD-10-CM | POA: Diagnosis present

## 2024-04-08 DIAGNOSIS — M4186 Other forms of scoliosis, lumbar region: Secondary | ICD-10-CM | POA: Diagnosis not present

## 2024-04-08 DIAGNOSIS — Z9889 Other specified postprocedural states: Principal | ICD-10-CM

## 2024-04-08 LAB — ABO/RH: ABO/RH(D): O POS

## 2024-04-08 SURGERY — POSTERIOR LUMBAR FUSION 2 LEVEL
Anesthesia: General | Site: Back

## 2024-04-08 MED ORDER — LACTATED RINGERS IV SOLN: INTRAVENOUS | Status: DC

## 2024-04-08 MED ORDER — ATENOLOL 25 MG PO TABS: 25.0000 mg | ORAL_TABLET | Freq: Every day | ORAL | Status: DC

## 2024-04-08 MED ORDER — AMISULPRIDE (ANTIEMETIC) 5 MG/2ML IV SOLN: 10.0000 mg | Freq: Once | INTRAVENOUS | Status: DC | PRN

## 2024-04-08 MED ORDER — PROPOFOL 10 MG/ML IV BOLUS
INTRAVENOUS | Status: AC
  Filled 2024-04-08: qty 20

## 2024-04-08 MED ORDER — THROMBIN 20000 UNITS EX SOLR
CUTANEOUS | Status: AC
  Filled 2024-04-08: qty 20000

## 2024-04-08 MED ORDER — MENTHOL 3 MG MT LOZG: 1.0000 | LOZENGE | OROMUCOSAL | Status: DC | PRN

## 2024-04-08 MED ORDER — STERILE WATER FOR IRRIGATION IR SOLN
Status: DC | PRN
Start: 2024-04-08 — End: 2024-04-08
  Administered 2024-04-08: 1000 mL

## 2024-04-08 MED ORDER — DEXAMETHASONE SODIUM PHOSPHATE 10 MG/ML IJ SOLN
INTRAMUSCULAR | Status: AC
Start: 2024-04-08 — End: 2024-04-08
  Filled 2024-04-08: qty 1

## 2024-04-08 MED ORDER — KETAMINE HCL 50 MG/5ML IJ SOSY
PREFILLED_SYRINGE | INTRAMUSCULAR | Status: AC
  Filled 2024-04-08: qty 5

## 2024-04-08 MED ORDER — ALBUTEROL SULFATE (2.5 MG/3ML) 0.083% IN NEBU
3.0000 mL | INHALATION_SOLUTION | Freq: Four times a day (QID) | RESPIRATORY_TRACT | Status: DC | PRN
Start: 2024-04-08 — End: 2024-04-09

## 2024-04-08 MED ORDER — ADHERUS DURAL SEALANT
PACK | TOPICAL | Status: DC | PRN
  Administered 2024-04-08: 1 via TOPICAL

## 2024-04-08 MED ORDER — ONDANSETRON HCL 4 MG/2ML IJ SOLN
INTRAMUSCULAR | Status: AC
  Filled 2024-04-08: qty 2

## 2024-04-08 MED ORDER — METHOCARBAMOL 500 MG PO TABS: 500.0000 mg | ORAL_TABLET | Freq: Four times a day (QID) | ORAL | Status: DC | PRN

## 2024-04-08 MED ORDER — LIDOCAINE 2% (20 MG/ML) 5 ML SYRINGE
INTRAMUSCULAR | Status: AC
  Filled 2024-04-08: qty 5

## 2024-04-08 MED ORDER — BUPIVACAINE HCL (PF) 0.25 % IJ SOLN
INTRAMUSCULAR | Status: DC | PRN
  Administered 2024-04-08: 5 mL

## 2024-04-08 MED ORDER — EPHEDRINE 5 MG/ML INJ
INTRAVENOUS | Status: AC
  Filled 2024-04-08: qty 5

## 2024-04-08 MED ORDER — ACETAMINOPHEN 500 MG PO TABS
1000.0000 mg | ORAL_TABLET | Freq: Four times a day (QID) | ORAL | Status: DC
  Administered 2024-04-08 – 2024-04-09 (×3): 1000 mg via ORAL
  Filled 2024-04-08 (×3): qty 2

## 2024-04-08 MED ORDER — BIOTIN 5000 MCG PO TABS: 5000.0000 ug | ORAL_TABLET | Freq: Every day | ORAL | Status: DC

## 2024-04-08 MED ORDER — OXYCODONE HCL 5 MG PO TABS
10.0000 mg | ORAL_TABLET | ORAL | Status: DC | PRN
  Administered 2024-04-08 – 2024-04-09 (×4): 10 mg via ORAL
  Filled 2024-04-08 (×4): qty 2

## 2024-04-08 MED ORDER — FENTANYL CITRATE (PF) 250 MCG/5ML IJ SOLN
INTRAMUSCULAR | Status: DC | PRN
  Administered 2024-04-08: 50 ug via INTRAVENOUS
  Administered 2024-04-08 (×2): 100 ug via INTRAVENOUS

## 2024-04-08 MED ORDER — CHLORHEXIDINE GLUCONATE CLOTH 2 % EX PADS: 6.0000 | MEDICATED_PAD | Freq: Once | CUTANEOUS | Status: DC

## 2024-04-08 MED ORDER — THROMBIN 5000 UNITS EX SOLR
OROMUCOSAL | Status: DC | PRN
  Administered 2024-04-08: 5 mL via TOPICAL

## 2024-04-08 MED ORDER — THROMBIN 20000 UNITS EX SOLR
CUTANEOUS | Status: DC | PRN
  Administered 2024-04-08: 20 mL via TOPICAL

## 2024-04-08 MED ORDER — SODIUM CHLORIDE 0.9% FLUSH: 3.0000 mL | INTRAVENOUS | Status: DC | PRN

## 2024-04-08 MED ORDER — GABAPENTIN 300 MG PO CAPS
300.0000 mg | ORAL_CAPSULE | ORAL | Status: AC
  Administered 2024-04-08: 300 mg via ORAL
  Filled 2024-04-08: qty 1

## 2024-04-08 MED ORDER — SENNA 8.6 MG PO TABS
1.0000 | ORAL_TABLET | Freq: Two times a day (BID) | ORAL | Status: DC
  Administered 2024-04-08: 8.6 mg via ORAL
  Filled 2024-04-08: qty 1

## 2024-04-08 MED ORDER — SURGIRINSE WOUND IRRIGATION SYSTEM - OPTIME
TOPICAL | Status: DC | PRN
  Administered 2024-04-08: 450 mL via TOPICAL

## 2024-04-08 MED ORDER — ASPIRIN 81 MG PO TBEC
81.0000 mg | DELAYED_RELEASE_TABLET | Freq: Every day | ORAL | Status: DC
  Administered 2024-04-08: 81 mg via ORAL
  Filled 2024-04-08: qty 1

## 2024-04-08 MED ORDER — FENTANYL CITRATE (PF) 100 MCG/2ML IJ SOLN: 25.0000 ug | INTRAMUSCULAR | Status: DC | PRN

## 2024-04-08 MED ORDER — MIDAZOLAM HCL 2 MG/2ML IJ SOLN
INTRAMUSCULAR | Status: DC | PRN
  Administered 2024-04-08: 2 mg via INTRAVENOUS

## 2024-04-08 MED ORDER — ALBUMIN HUMAN 5 % IV SOLN
INTRAVENOUS | Status: AC
  Filled 2024-04-08: qty 250

## 2024-04-08 MED ORDER — LORATADINE 10 MG PO TABS
10.0000 mg | ORAL_TABLET | Freq: Every evening | ORAL | Status: DC
  Administered 2024-04-08: 10 mg via ORAL
  Filled 2024-04-08: qty 1

## 2024-04-08 MED ORDER — FUROSEMIDE 40 MG PO TABS: 40.0000 mg | ORAL_TABLET | Freq: Every day | ORAL | Status: DC | PRN

## 2024-04-08 MED ORDER — THROMBIN 5000 UNITS EX KIT
PACK | CUTANEOUS | Status: AC
  Filled 2024-04-08: qty 1

## 2024-04-08 MED ORDER — ROCURONIUM BROMIDE 10 MG/ML (PF) SYRINGE
PREFILLED_SYRINGE | INTRAVENOUS | Status: AC
  Filled 2024-04-08: qty 10

## 2024-04-08 MED ORDER — ONDANSETRON HCL 4 MG PO TABS: 4.0000 mg | ORAL_TABLET | Freq: Four times a day (QID) | ORAL | Status: DC | PRN

## 2024-04-08 MED ORDER — PROPOFOL 500 MG/50ML IV EMUL
INTRAVENOUS | Status: DC | PRN
  Administered 2024-04-08: 10 ug/kg/min via INTRAVENOUS

## 2024-04-08 MED ORDER — BUPIVACAINE HCL (PF) 0.25 % IJ SOLN
INTRAMUSCULAR | Status: AC
  Filled 2024-04-08: qty 30

## 2024-04-08 MED ORDER — HYDROMORPHONE HCL 1 MG/ML IJ SOLN: 1.0000 mg | INTRAMUSCULAR | Status: DC | PRN

## 2024-04-08 MED ORDER — CHLORHEXIDINE GLUCONATE 0.12 % MT SOLN
15.0000 mL | Freq: Once | OROMUCOSAL | Status: AC
  Administered 2024-04-08: 15 mL via OROMUCOSAL
  Filled 2024-04-08: qty 15

## 2024-04-08 MED ORDER — SODIUM CHLORIDE 0.9% FLUSH
3.0000 mL | Freq: Two times a day (BID) | INTRAVENOUS | Status: DC
  Administered 2024-04-08: 3 mL via INTRAVENOUS

## 2024-04-08 MED ORDER — EPHEDRINE SULFATE-NACL 50-0.9 MG/10ML-% IV SOSY
PREFILLED_SYRINGE | INTRAVENOUS | Status: DC | PRN
  Administered 2024-04-08 (×2): 10 mg via INTRAVENOUS
  Administered 2024-04-08: 5 mg via INTRAVENOUS

## 2024-04-08 MED ORDER — CEFAZOLIN SODIUM-DEXTROSE 2-4 GM/100ML-% IV SOLN
2.0000 g | Freq: Three times a day (TID) | INTRAVENOUS | Status: AC
  Administered 2024-04-08 – 2024-04-09 (×2): 2 g via INTRAVENOUS
  Filled 2024-04-08 (×2): qty 100

## 2024-04-08 MED ORDER — METHOCARBAMOL 1000 MG/10ML IJ SOLN: 500.0000 mg | Freq: Four times a day (QID) | INTRAMUSCULAR | Status: DC | PRN

## 2024-04-08 MED ORDER — SODIUM CHLORIDE 0.9 % IV SOLN: 250.0000 mL | INTRAVENOUS | Status: DC

## 2024-04-08 MED ORDER — ONDANSETRON HCL 4 MG/2ML IJ SOLN: 4.0000 mg | Freq: Four times a day (QID) | INTRAMUSCULAR | Status: DC | PRN

## 2024-04-08 MED ORDER — FENTANYL CITRATE (PF) 250 MCG/5ML IJ SOLN
INTRAMUSCULAR | Status: AC
  Filled 2024-04-08: qty 5

## 2024-04-08 MED ORDER — PHENYLEPHRINE HCL-NACL 20-0.9 MG/250ML-% IV SOLN
INTRAVENOUS | Status: DC | PRN
Start: 2024-04-08 — End: 2024-04-08
  Administered 2024-04-08: 50 ug/min via INTRAVENOUS

## 2024-04-08 MED ORDER — POTASSIUM CHLORIDE IN NACL 20-0.9 MEQ/L-% IV SOLN
INTRAVENOUS | Status: DC
  Filled 2024-04-08: qty 1000

## 2024-04-08 MED ORDER — PHENOL 1.4 % MT LIQD: 1.0000 | OROMUCOSAL | Status: DC | PRN

## 2024-04-08 MED ORDER — CITALOPRAM HYDROBROMIDE 20 MG PO TABS
10.0000 mg | ORAL_TABLET | Freq: Every day | ORAL | Status: DC
  Administered 2024-04-08: 10 mg via ORAL
  Filled 2024-04-08: qty 1

## 2024-04-08 MED ORDER — ROCURONIUM BROMIDE 10 MG/ML (PF) SYRINGE
PREFILLED_SYRINGE | INTRAVENOUS | Status: DC | PRN
  Administered 2024-04-08: 20 mg via INTRAVENOUS
  Administered 2024-04-08: 50 mg via INTRAVENOUS
  Administered 2024-04-08: 20 mg via INTRAVENOUS
  Administered 2024-04-08: 10 mg via INTRAVENOUS
  Administered 2024-04-08: 30 mg via INTRAVENOUS

## 2024-04-08 MED ORDER — ADULT MULTIVITAMIN W/MINERALS CH
1.0000 | ORAL_TABLET | Freq: Every day | ORAL | Status: DC
  Administered 2024-04-08: 1 via ORAL
  Filled 2024-04-08: qty 1

## 2024-04-08 MED ORDER — CEFAZOLIN SODIUM-DEXTROSE 2-4 GM/100ML-% IV SOLN
2.0000 g | INTRAVENOUS | Status: AC
  Administered 2024-04-08: 2 g via INTRAVENOUS
  Filled 2024-04-08: qty 100

## 2024-04-08 MED ORDER — 0.9 % SODIUM CHLORIDE (POUR BTL) OPTIME
TOPICAL | Status: DC | PRN
  Administered 2024-04-08: 1000 mL

## 2024-04-08 MED ORDER — ONDANSETRON HCL 4 MG/2ML IJ SOLN: 4.0000 mg | Freq: Once | INTRAMUSCULAR | Status: DC | PRN

## 2024-04-08 MED ORDER — ORAL CARE MOUTH RINSE: 15.0000 mL | Freq: Once | OROMUCOSAL | Status: AC

## 2024-04-08 MED ORDER — PROPOFOL 10 MG/ML IV BOLUS
INTRAVENOUS | Status: DC | PRN
  Administered 2024-04-08: 30 mg via INTRAVENOUS
  Administered 2024-04-08: 150 mg via INTRAVENOUS

## 2024-04-08 MED ORDER — ALBUMIN HUMAN 5 % IV SOLN
12.5000 g | Freq: Once | INTRAVENOUS | Status: AC
  Administered 2024-04-08: 12.5 g via INTRAVENOUS

## 2024-04-08 MED ORDER — MIDAZOLAM HCL 2 MG/2ML IJ SOLN
INTRAMUSCULAR | Status: AC
  Filled 2024-04-08: qty 2

## 2024-04-08 MED ORDER — SUGAMMADEX SODIUM 200 MG/2ML IV SOLN
INTRAVENOUS | Status: DC | PRN
  Administered 2024-04-08: 200 mg via INTRAVENOUS

## 2024-04-08 MED ORDER — CELECOXIB 200 MG PO CAPS
200.0000 mg | ORAL_CAPSULE | Freq: Two times a day (BID) | ORAL | Status: DC
  Administered 2024-04-08: 200 mg via ORAL
  Filled 2024-04-08: qty 1

## 2024-04-08 MED ORDER — DEXAMETHASONE SODIUM PHOSPHATE 10 MG/ML IJ SOLN
INTRAMUSCULAR | Status: DC | PRN
  Administered 2024-04-08: 10 mg via INTRAVENOUS

## 2024-04-08 MED ORDER — MIRABEGRON ER 25 MG PO TB24
25.0000 mg | ORAL_TABLET | Freq: Every day | ORAL | Status: DC
  Administered 2024-04-08: 25 mg via ORAL
  Filled 2024-04-08: qty 1

## 2024-04-08 MED ORDER — LEVOCETIRIZINE DIHYDROCHLORIDE 5 MG PO TABS: 5.0000 mg | ORAL_TABLET | Freq: Every evening | ORAL | Status: DC

## 2024-04-08 MED ORDER — LIDOCAINE 2% (20 MG/ML) 5 ML SYRINGE
INTRAMUSCULAR | Status: DC | PRN
  Administered 2024-04-08: 100 mg via INTRAVENOUS

## 2024-04-08 MED ORDER — ALBUMIN HUMAN 5 % IV SOLN: INTRAVENOUS | Status: DC | PRN

## 2024-04-08 MED ORDER — ACETAMINOPHEN 500 MG PO TABS
1000.0000 mg | ORAL_TABLET | ORAL | Status: AC
  Administered 2024-04-08: 1000 mg via ORAL
  Filled 2024-04-08: qty 2

## 2024-04-08 MED ORDER — THROMBIN (RECOMBINANT) 5000 UNITS EX SOLR
CUTANEOUS | Status: AC
  Filled 2024-04-08: qty 5000

## 2024-04-08 SURGICAL SUPPLY — 57 items
ALLOGRAFT BONESTRIP KORE 2.5X5 (Bone Implant) IMPLANT
BAG COUNTER SPONGE SURGICOUNT (BAG) ×1 IMPLANT
BASKET BONE COLLECTION (BASKET) ×1 IMPLANT
BENZOIN TINCTURE PRP APPL 2/3 (GAUZE/BANDAGES/DRESSINGS) ×1 IMPLANT
BLADE BONE MILL MEDIUM (MISCELLANEOUS) ×1 IMPLANT
BUR CARBIDE MATCH 3.0 (BURR) ×1 IMPLANT
CANISTER SUCTION 3000ML PPV (SUCTIONS) ×1 IMPLANT
CLEANSER WND VASHE INSTL 475 (WOUND CARE) ×1 IMPLANT
CNTNR URN SCR LID CUP LEK RST (MISCELLANEOUS) ×1 IMPLANT
COVER BACK TABLE 60X90IN (DRAPES) ×1 IMPLANT
DERMABOND ADVANCED .7 DNX12 (GAUZE/BANDAGES/DRESSINGS) IMPLANT
DRAPE C-ARM 42X72 X-RAY (DRAPES) ×2 IMPLANT
DRAPE C-ARMOR (DRAPES) ×2 IMPLANT
DRAPE LAPAROTOMY 100X72X124 (DRAPES) ×1 IMPLANT
DRAPE SURG 17X23 STRL (DRAPES) ×1 IMPLANT
DRSG AQUACEL AG ADV 3.5X 6 (GAUZE/BANDAGES/DRESSINGS) IMPLANT
DURAPREP 26ML APPLICATOR (WOUND CARE) ×1 IMPLANT
ELECTRODE REM PT RTRN 9FT ADLT (ELECTROSURGICAL) ×1 IMPLANT
EVACUATOR 1/8 PVC DRAIN (DRAIN) ×1 IMPLANT
GAUZE 4X4 16PLY ~~LOC~~+RFID DBL (SPONGE) IMPLANT
GLOVE BIO SURGEON STRL SZ7 (GLOVE) IMPLANT
GLOVE BIO SURGEON STRL SZ8 (GLOVE) ×2 IMPLANT
GLOVE BIOGEL PI IND STRL 7.0 (GLOVE) IMPLANT
GOWN STRL REUS W/ TWL LRG LVL3 (GOWN DISPOSABLE) ×1 IMPLANT
GOWN STRL REUS W/ TWL XL LVL3 (GOWN DISPOSABLE) ×1 IMPLANT
GOWN STRL REUS W/TWL 2XL LVL3 (GOWN DISPOSABLE) IMPLANT
HEMOSTAT POWDER KIT SURGIFOAM (HEMOSTASIS) ×1 IMPLANT
HEMOSTAT POWDER SURGIFOAM 1G (HEMOSTASIS) IMPLANT
KIT BASIN OR (CUSTOM PROCEDURE TRAY) ×1 IMPLANT
KIT TURNOVER KIT B (KITS) ×1 IMPLANT
MILL BONE PREP (MISCELLANEOUS) ×1 IMPLANT
NDL HYPO 25X1 1.5 SAFETY (NEEDLE) ×1 IMPLANT
NEEDLE HYPO 25X1 1.5 SAFETY (NEEDLE) ×1 IMPLANT
NS IRRIG 1000ML POUR BTL (IV SOLUTION) ×1 IMPLANT
PACK LAMINECTOMY NEURO (CUSTOM PROCEDURE TRAY) ×1 IMPLANT
PAD ARMBOARD POSITIONER FOAM (MISCELLANEOUS) ×3 IMPLANT
PATTIES SURGICAL 1X1 (DISPOSABLE) IMPLANT
ROD LORD LIPPED TI 5.5X65 (Rod) IMPLANT
SCREW KODIAK 5.5X45 (Screw) IMPLANT
SCREW POLYAXIAL TULIP (Screw) IMPLANT
SCREW SHANK MOD 5.5X40 (Screw) IMPLANT
SEALANT ADHERUS EXTEND TIP (MISCELLANEOUS) IMPLANT
SET SCREW SPNE (Screw) IMPLANT
SOLUTION IRRIG SURGIPHOR (IV SOLUTION) IMPLANT
SPACER IDENTITI 9X9X25 5D (Spacer) IMPLANT
SPACER IDENTITI PS 5D 8X9X25 (Spacer) IMPLANT
SPONGE SURGIFOAM ABS GEL 100 (HEMOSTASIS) ×1 IMPLANT
STRIP CLOSURE SKIN 1/2X4 (GAUZE/BANDAGES/DRESSINGS) IMPLANT
SUT PROLENE 6 0 BV (SUTURE) IMPLANT
SUT VIC AB 0 CT1 18XCR BRD8 (SUTURE) ×1 IMPLANT
SUT VIC AB 2-0 CP2 18 (SUTURE) ×1 IMPLANT
SUT VIC AB 3-0 SH 8-18 (SUTURE) ×2 IMPLANT
SYR CONTROL 10ML LL (SYRINGE) ×1 IMPLANT
TOWEL GREEN STERILE (TOWEL DISPOSABLE) ×1 IMPLANT
TOWEL GREEN STERILE FF (TOWEL DISPOSABLE) ×1 IMPLANT
TRAY FOLEY MTR SLVR 16FR STAT (SET/KITS/TRAYS/PACK) ×1 IMPLANT
WATER STERILE IRR 1000ML POUR (IV SOLUTION) ×1 IMPLANT

## 2024-04-08 NOTE — Anesthesia Procedure Notes (Signed)
 Procedure Name: Intubation Date/Time: 04/08/2024 12:55 PM  Performed by: Alen Motto D, CRNAPre-anesthesia Checklist: Patient identified, Emergency Drugs available, Suction available and Patient being monitored Patient Re-evaluated:Patient Re-evaluated prior to induction Oxygen Delivery Method: Circle System Utilized Preoxygenation: Pre-oxygenation with 100% oxygen Induction Type: IV induction Ventilation: Mask ventilation without difficulty Laryngoscope Size: Mac and 3 Grade View: Grade I Tube type: Oral Tube size: 7.5 mm Number of attempts: 1 Airway Equipment and Method: Stylet and Oral airway Placement Confirmation: ETT inserted through vocal cords under direct vision, positive ETCO2 and breath sounds checked- equal and bilateral Secured at: 22 cm Tube secured with: Tape Dental Injury: Teeth and Oropharynx as per pre-operative assessment

## 2024-04-08 NOTE — H&P (Signed)
 Subjective: Patient is a 68 y.o. female admitted for lumbar stenosis and instability. Onset of symptoms was several months ago, gradually worsening since that time.  The pain is rated severe, and is located at the across the lower back and radiates to legs. The pain is described as aching and occurs all day. The symptoms have been progressive. Symptoms are exacerbated by exercise and standing. MRI or CT showed spondylolisthesis L3-4 with spinal stenosis, and lateral recess stenosis L2-3 with scoliosis  Past Medical History:  Diagnosis Date   Allergy    Anxiety    Depression    Headache    Hyperlipidemia    Hypertension    Obese    PONV (postoperative nausea and vomiting)     Past Surgical History:  Procedure Laterality Date   ABDOMINAL HYSTERECTOMY     carpal tunnel surgery bilaterally     CHOLECYSTECTOMY     left foot fascia release     parotid gland surgery right side      Prior to Admission medications   Medication Sig Start Date End Date Taking? Authorizing Provider  aspirin  81 MG EC tablet Take 81 mg by mouth at bedtime.   Yes [provider]  atenolol  (TENORMIN ) 25 MG tablet TAKE 1 TABLET BY MOUTH DAILY 08/11/23  Yes Katrinka Garnette KIDD, MD  Biotin  5000 MCG TABS Take 5,000 mcg by mouth daily.   Yes [provider]  Calcium Carbonate Antacid (CALCIUM CARBONATE PO) Take 1,000 mg by mouth daily with breakfast.   Yes [provider]  Cholecalciferol (VITAMIN D-3) 125 MCG (5000 UT) TABS Take 5,000 Units by mouth daily.   Yes [provider]  citalopram  (CELEXA ) 10 MG tablet Take 1 tablet (10 mg total) by mouth daily. 08/11/23  Yes Katrinka Garnette KIDD, MD  fish oil-omega-3 fatty acids 1000 MG capsule Take 1 g by mouth daily.   Yes [provider]  furosemide  (LASIX ) 40 MG tablet Take 40 mg by mouth daily as needed for fluid or edema. 11/02/23  Yes [provider]  GEMTESA 75 MG TABS Take 75 mg by mouth daily. 09/26/21  Yes [provider]  ibuprofen (ADVIL) 800 MG tablet Take 800 mg by mouth every 8 (eight) hours as needed (Back pain).   Yes [provider]  levocetirizine (XYZAL ) 5 MG tablet Take 5 mg by mouth every evening.   Yes [provider]  Multiple Vitamin (MULTIVITAMIN) tablet Take 1 tablet by mouth daily.  Women   Yes [provider]  ondansetron  (ZOFRAN -ODT) 4 MG disintegrating tablet Take 1 tablet (4 mg total) by mouth every 8 (eight) hours as needed for nausea or vomiting. 06/12/23  Yes Katrinka Garnette KIDD, MD  simvastatin  (ZOCOR ) 40 MG tablet Take 1 tablet (40 mg total) by mouth at bedtime. 08/11/23  Yes Katrinka Garnette KIDD, MD  vitamin B-12 (CYANOCOBALAMIN ) 1000 MCG tablet Take 2,000 mcg by mouth daily.   Yes [provider]  albuterol  (VENTOLIN  HFA) 108 (90 Base) MCG/ACT inhaler Inhale 2 puffs into the lungs every 6 (six) hours as needed. Patient not taking: Reported on 03/25/2024 02/13/24   Leath-Warren, Etta PARAS, NP  promethazine -dextromethorphan (PROMETHAZINE -DM) 6.25-15 MG/5ML syrup Take 5 mLs by mouth 4 (four) times daily as needed for cough. 02/13/24   Leath-Warren, Etta PARAS, NP   Allergies  Allergen Reactions   Prednisone  Other (See Comments)    Pt does not tolerate oral prednisone -causes severe migraine, body on fire, heart racing- Injections only  Social History   Tobacco Use   Smoking status: Never   Smokeless tobacco: Never  Substance Use Topics   Alcohol  use: No    Family History  Problem Relation Age of Onset   Hypertension Mother    Angina Mother        no stents   Uterine cancer Mother    Alzheimer's disease Mother        in facility since 04/20/15. died sept 04/19/2020   Heart attack Father        mid 45s. died at 63   Glaucoma Father    Lymphoma Sister        in spinal fluid- at [facility] and doing ok 04/20/23     Review of Systems  Positive ROS: Negative  All other systems have been reviewed and were otherwise negative with the exception of those  mentioned in the HPI and as above.  Objective: Vital signs in last 24 hours: Temp:  [98.4 F (36.9 C)] 98.4 F (36.9 C) (08/29 0942) Pulse Rate:  [69] 69 (08/29 0942) Resp:  [18] 18 (08/29 0942) BP: (144)/(87) 144/87 (08/29 0942) SpO2:  [95 %] 95 % (08/29 0942) Weight:  [107.2 kg] 107.2 kg (08/29 0942)  General Appearance: Alert, cooperative, no distress, appears stated age Head: Normocephalic, without obvious abnormality, atraumatic Eyes: PERRL, conjunctiva/corneas clear, EOM's intact    Neck: Supple, symmetrical, trachea midline Back: Symmetric, no curvature, ROM normal, no CVA tenderness Lungs:  respirations unlabored Heart: Regular rate and rhythm Abdomen: Soft, non-tender Extremities: Extremities normal, atraumatic, no cyanosis or edema Pulses: 2+ and symmetric all extremities Skin: Skin color, texture, turgor normal, no rashes or lesions  NEUROLOGIC:   Mental status: Alert and oriented x4,  no aphasia, good attention span, fund of knowledge, and memory Motor Exam - grossly normal Sensory Exam - grossly normal Reflexes: 1+ Coordination - grossly normal Gait - grossly normal Balance - grossly normal Cranial Nerves: I: smell Not tested  II: visual acuity  OS: nl    OD: nl  II: visual fields Full to confrontation  II: pupils Equal, round, reactive to light  III,VII: ptosis None  III,IV,VI: extraocular muscles  Full ROM  V: mastication Normal  V: facial light touch sensation  Normal  V,VII: corneal reflex  Present  VII: facial muscle function - upper  Normal  VII: facial muscle function - lower Normal  VIII: hearing Not tested  IX: soft palate elevation  Normal  IX,X: gag reflex Present  XI: trapezius strength  5/5  XI: sternocleidomastoid strength 5/5  XI: neck flexion strength  5/5  XII: tongue strength  Normal    Data Review Lab Results  Component Value Date   WBC 8.2 03/30/2024   HGB 15.1 (H) 03/30/2024   HCT 46.0 03/30/2024   MCV 95.4 03/30/2024    PLT 274 03/30/2024   Lab Results  Component Value Date   NA 141 03/30/2024   K 4.0 03/30/2024   CL 103 03/30/2024   CO2 28 03/30/2024   BUN 18 03/30/2024   CREATININE 0.66 03/30/2024   GLUCOSE 96 03/30/2024   No results found for: INR, PROTIME  Assessment/Plan:  Estimated body mass index is 37.01 kg/m as calculated from the following:   Height as of this encounter: 5' 7 (1.702 m).   Weight as of this encounter: 107.2 kg. Patient admitted for plif L2-3 L3-4. Patient has failed a reasonable attempt at conservative therapy.  I explained the condition and procedure to the patient and  answered any questions.  Patient wishes to proceed with procedure as planned. Understands risks/ benefits and typical outcomes of procedure.   Alm GORMAN Molt 04/08/2024 11:59 AM

## 2024-04-08 NOTE — Op Note (Signed)
 04/08/2024  5:14 PM  PATIENT:  Rachel Gates  68 y.o. female  PRE-OPERATIVE DIAGNOSIS: Spondylolisthesis and lateral listhesis with scoliosis L2-3 L3-4, spinal stenosis with back and leg pain  POST-OPERATIVE DIAGNOSIS:  same  PROCEDURE:   1. Decompressive lumbar laminectomy, hemi facetectomy and foraminotomies L2-3 L3-4 requiring more work than would be required for a simple exposure of the disk for PLIF in order to adequately decompress the neural elements and address the spinal stenosis 2. Posterior lumbar interbody fusion L2-3 L3-4 using PTI interbody cages packed with morcellized allograft and autograft 3. Posterior fixation L2-L4 inclusive utilizing ATEC cortical pedicle screws.  4. Intertransverse arthrodesis L2-L4 bilaterally using morcellized autograft and allograft.  SURGEON:  Alm Molt, MD  ASSISTANTS: Suzen Pean, FNP  ANESTHESIA:  General  EBL: 800 ml  Total I/O In: 500 [IV Piggyback:500] Out: 1035 [Urine:235; Blood:800]  BLOOD ADMINISTERED:none  DRAINS: none  Applications: Unintended durotomy repaired primarily at L2-3 on the left  INDICATION FOR PROCEDURE: This patient presented with pain with leg pain. Imaging revealed spondylolisthesis and lateral listhesis with scoliosis L2-L3 4 with spinal stenosis. The patient tried a reasonable attempt at conservative medical measures without relief. I recommended decompression and instrumented fusion to address the stenosis as well as the segmental  instability.  Patient understood the risks, benefits, and alternatives and potential outcomes and wished to proceed.  PROCEDURE DETAILS:  The patient was brought to the operating room. After induction of generalized endotracheal anesthesia the patient was rolled into the prone position on chest rolls and all pressure points were padded. The patient's lumbar region was cleaned and then prepped with DuraPrep and draped in the usual sterile fashion. Anesthesia was injected and  then a dorsal midline incision was made and carried down to the lumbosacral fascia. The fascia was opened and the paraspinous musculature was taken down in a subperiosteal fashion to expose L2-3 and L3-4. A self-retaining retractor was placed. Intraoperative fluoroscopy confirmed my level, and I started with placement of the L2 cortical pedicle screws. The pedicle screw entry zones were identified utilizing surface landmarks and  AP and lateral fluoroscopy. I scored the cortex with the high-speed drill and then used the hand drill to drill an upward and outward direction into the pedicle. I then tapped line to line. I then placed a 5.5 x 45 mm cortical pedicle screw into the pedicles of L2 bilaterally.    I then turned my attention to the decompression and complete lumbar laminectomies, hemi- facetectomies, and foraminotomies were performed at L2-3 and L3-4.  My nurse practitioner was directly involved in the decompression and exposure of the neural elements.  Unfortunately, on the left at L2-3 the dura was quite adherent to the overlying facet and yellow ligament.  There was an unintended durotomy here.  Therefore I removed the spinous processes and performed a complete Hemi facetectomy at both levels.  I was then able to repair the unintended durotomy with a running 6-0 Prolene suture.  The patient had significant spinal stenosis and this required more work than would be required for a simple exposure of the disc for posterior lumbar interbody fusion which would only require a limited laminotomy. Much more generous decompression and generous foraminotomy was undertaken in order to adequately decompress the neural elements and address the patient's leg pain. The yellow ligament was removed to expose the underlying dura and nerve roots, and generous foraminotomies were performed to adequately decompress the neural elements. Both the exiting and traversing nerve roots were decompressed on  both sides until a  coronary dilator passed easily along the nerve roots. Once the decompression was complete, I turned my attention to the posterior lower lumbar interbody fusion. The epidural venous vasculature was coagulated and cut sharply. Disc space was incised and the initial discectomy was performed with pituitary rongeurs. The disc space was distracted with sequential distractors to a height of 9 mm. We then used a series of scrapers and shavers to prepare the endplates for fusion. The midline was prepared with Epstein curettes. Once the complete discectomy was finished, we packed an appropriate sized interbody cage with local autograft and morcellized allograft, gently retracted the nerve root, and tapped the cage into position at L2-3 and L3-4 bilaterally.  The midline between the cages was packed with morselized autograft and allograft.   We then turned our attention to the placement of the lower pedicle screws. The pedicle screw entry zones were identified utilizing surface landmarks and fluoroscopy. I drilled into each pedicle utilizing the hand drill, and tapped each pedicle with the appropriate tap. We palpated with a ball probe to assure no break in the cortex. We then placed 5.5 x 45 mm pedicle screws into the pedicles bilaterally at L3 and L4 bilaterally.  My nurse practitioner assisted in placement of the pedicle screws.  We then decorticated the transverse processes and laid a mixture of morcellized autograft and allograft out over these to perform intertransverse arthrodesis at L2-L4. We then placed lordotic rods into the multiaxial screw heads of the pedicle screws and locked these in position with the locking caps and anti-torque device. We then checked our construct with AP and lateral fluoroscopy. Irrigated with copious amounts of 0.5% povidone iodine solution followed by saline solution. Inspected the nerve roots once again to assure adequate decompression, checked of Valsalva and found no leakage at the  suture line, lined to the dura with Gelfoam with a piece of muscle over the repair site followed by Tisseel fibrin glue,  and then we closed the muscle and the fascia with 0 Vicryl. Closed the subcutaneous tissues with 2-0 Vicryl and subcuticular tissues with 3-0 Vicryl. The skin was closed with benzoin and Steri-Strips. Dressing was then applied, the patient was awakened from general anesthesia and transported to the recovery room in stable condition. At the end of the procedure all sponge, needle and instrument counts were correct.   PLAN OF CARE: admit to inpatient  PATIENT DISPOSITION:  PACU - hemodynamically stable.   Delay start of Pharmacological VTE agent (>24hrs) due to surgical blood loss or risk of bleeding:  yes

## 2024-04-08 NOTE — Anesthesia Preprocedure Evaluation (Addendum)
 Anesthesia Evaluation  Patient identified by MRN, date of birth, ID band Patient awake    Reviewed: Allergy & Precautions, NPO status , Patient's Chart, lab work & pertinent test results  History of Anesthesia Complications (+) PONV and history of anesthetic complications  Airway Mallampati: II  TM Distance: >3 FB Neck ROM: Full    Dental no notable dental hx.    Pulmonary neg pulmonary ROS   Pulmonary exam normal        Cardiovascular hypertension, Pt. on home beta blockers Normal cardiovascular exam     Neuro/Psych  Headaches PSYCHIATRIC DISORDERS Anxiety Depression       GI/Hepatic negative GI ROS, Neg liver ROS,,,  Endo/Other  negative endocrine ROS    Renal/GU negative Renal ROS     Musculoskeletal   Abdominal  (+) + obese  Peds  Hematology negative hematology ROS (+)   Anesthesia Other Findings Spondylolisthesis lumbar region  Reproductive/Obstetrics                              Anesthesia Physical Anesthesia Plan  ASA: 3  Anesthesia Plan: General   Post-op Pain Management:    Induction: Intravenous  PONV Risk Score and Plan: 4 or greater and Ondansetron , Midazolam , Propofol  infusion and Treatment may vary due to age or medical condition  Airway Management Planned: Oral ETT  Additional Equipment:   Intra-op Plan:   Post-operative Plan: Extubation in OR  Informed Consent: I have reviewed the patients History and Physical, chart, labs and discussed the procedure including the risks, benefits and alternatives for the proposed anesthesia with the patient or authorized representative who has indicated his/her understanding and acceptance.     Dental advisory given  Plan Discussed with: CRNA  Anesthesia Plan Comments:          Anesthesia Quick Evaluation

## 2024-04-08 NOTE — Transfer of Care (Signed)
 Immediate Anesthesia Transfer of Care Note  Patient: Brittay Swaz[place]  Procedure(s) Performed: POSTERIOR LUMBAR FUSION 2 LEVEL (Back)  Patient Location: PACU  Anesthesia Type:General  Level of Consciousness: drowsy and patient cooperative  Airway & Oxygen Therapy: Patient Spontanous Breathing and Patient connected to face mask oxygen  Post-op Assessment: Report given to RN and Post -op Vital signs reviewed and stable  Post vital signs: Reviewed and stable  Last Vitals:  Vitals Value Taken Time  BP 90/52 04/08/24 17:30  Temp 37.2 C 04/08/24 17:30  Pulse 79 04/08/24 17:34  Resp 13 04/08/24 17:34  SpO2 96 % 04/08/24 17:34  Vitals shown include unfiled device data.  Last Pain:  Vitals:   04/08/24 1004  PainSc: 8       Patients Stated Pain Goal: 4 (04/08/24 1004)  Complications: No notable events documented.

## 2024-04-09 MED ORDER — METHOCARBAMOL 500 MG PO TABS: 500.0000 mg | ORAL_TABLET | Freq: Four times a day (QID) | ORAL | 0 refills | Status: DC | PRN

## 2024-04-09 MED ORDER — POLYETHYLENE GLYCOL 3350 17 G PO PACK: 17.0000 g | PACK | Freq: Every day | ORAL | 0 refills | Status: DC

## 2024-04-09 MED ORDER — OXYCODONE-ACETAMINOPHEN 5-325 MG PO TABS: 1.0000 | ORAL_TABLET | ORAL | 0 refills | Status: DC | PRN

## 2024-04-09 MED ORDER — ONDANSETRON 4 MG PO TBDP: 4.0000 mg | ORAL_TABLET | Freq: Three times a day (TID) | ORAL | 0 refills | Status: DC | PRN

## 2024-04-09 NOTE — Evaluation (Signed)
 Occupational Therapy Evaluation Patient Details Name: Rachel Gates MRN: [id number] DOB: September 27, 1955 Today's Date: 04/09/2024   History of Present Illness   Pt is a 68 yo female presenting to [facility] on 04/08/24 for elective back sx, s/p PLIF L2-L4.  PMH of HLD, HTN, obesity, anxiety, PONV.     Clinical Impressions Pt admitted for above, PTA pt lived alone and was ind with mobility and ADLs, at DC pt will being staying with her younger sister. Pt was educated on back precautions and compensatory strategies for ADLs/iADLs including home adjustments to maintain her restrictions. Pt has premedicated prior to session and had bouts of nausea and lightheadedness during session, needed CGA +RW for safe gait. Pt did demonstrate ability to complete ADLs with setup/mod I level and min cues needed in beginning of session to refrain from twisting. PT demonstrated good knowledge of strategies and maintenance of precautions by end of session during ADL performance. She has no further acute skilled OT needs, would benefit from seeing PT prior to DC. No post acute OT recommeded.      If plan is discharge home, recommend the following:   Assist for transportation;Assistance with cooking/housework     Functional Status Assessment   Patient has had a recent decline in their functional status and/or demonstrates limited ability to make significant improvements in function in a reasonable and predictable amount of time     Equipment Recommendations   None recommended by OT     Recommendations for Other Services         Precautions/Restrictions   Precautions Precautions: Back Precaution Booklet Issued: Yes (comment) Recall of Precautions/Restrictions: Intact Required Braces or Orthoses: Spinal Brace Spinal Brace: Lumbar corset;Applied in sitting position Restrictions Weight Bearing Restrictions Per Provider Order: No     Mobility Bed Mobility Overal bed mobility: Modified Independent              General bed mobility comments: Pt demonstrated good use of log roll to get in/EOB    Transfers Overall transfer level: Needs assistance Equipment used: None Transfers: Sit to/from Stand Sit to Stand: Contact guard assist           General transfer comment: CGA for safety      Balance Overall balance assessment: Needs assistance Sitting-balance support: No upper extremity supported, Feet supported Sitting balance-Leahy Scale: Good     Standing balance support: Bilateral upper extremity supported, During functional activity Standing balance-Leahy Scale: Fair                             ADL either performed or assessed with clinical judgement   ADL Overall ADL's : Needs assistance/impaired Eating/Feeding: Independent;Sitting   Grooming: Standing;Supervision/safety;Wash/dry hands;Wash/dry face   Upper Body Bathing: Standing;Supervision/ safety;Set up   Lower Body Bathing: Sitting/lateral leans;Sit to/from stand;Set up;Supervison/ safety   Upper Body Dressing : Independent;Standing   Lower Body Dressing: Sit to/from stand;Supervision/safety;Sitting/lateral leans   Toilet Transfer: Contact guard assist;Ambulation   Toileting- Clothing Manipulation and Hygiene: Supervision/safety;Sit to/from stand Toileting - Clothing Manipulation Details (indicate cue type and reason): + grab bar     Functional mobility during ADLs: Contact guard assist;Rolling walker (2 wheels)       Vision   Vision Assessment?: No apparent visual deficits     Perception         Praxis         Pertinent Vitals/Pain Pain Assessment Pain Assessment: 0-10 Pain Score: 4  Pain Location:  back, op site Pain Descriptors / Indicators: Aching, Discomfort Pain Intervention(s): Limited activity within patient's tolerance, Monitored during session, Repositioned, Premedicated before session     Extremity/Trunk Assessment Upper Extremity Assessment Upper Extremity  Assessment: Overall WFL for tasks assessed   Lower Extremity Assessment Lower Extremity Assessment: Overall WFL for tasks assessed (LLE 4+/5 Hip flexors)   Cervical / Trunk Assessment Cervical / Trunk Assessment: Back Surgery   Communication Communication Communication: No apparent difficulties   Cognition Arousal: Alert Behavior During Therapy: WFL for tasks assessed/performed Cognition: No apparent impairments                               Following commands: Intact       Cueing  General Comments   Cueing Techniques: Verbal cues  Pt became nauseous mid session, regurgitated but was open to continue with therapy. She also became lightheaded following moblilty, suspect meds played a factor.   Exercises     Shoulder Instructions      Home Living Family/patient expects to be discharged to:: Private residence Living Arrangements: Alone Available Help at Discharge: Family;Available 24 hours/day Type of Home: House Home Access: Stairs to enter [employer] of Steps: 7 Entrance Stairs-Rails: Can reach both;Left;Right Home Layout: One level     Bathroom Shower/Tub: Tub/shower unit;Walk-in shower   Bathroom Toilet: Handicapped height     Home Equipment: Shower seat;Grab bars - tub/shower;Hand held shower head;BSC/3in1   Additional Comments: Pt reports she will be staying with her sister at DC, homesetup based around sister. Pt did clarify that her home is also with similar equipment and setup but no stairs.      Prior Functioning/Environment Prior Level of Function : Independent/Modified Independent;Driving             Mobility Comments: no AD      OT Problem List: Pain;Decreased knowledge of precautions;Impaired balance (sitting and/or standing)   OT Treatment/Interventions:        OT Goals(Current goals can be found in the care plan section)   Acute Rehab OT Goals Potential to Achieve Goals: Good   OT Frequency:        Co-evaluation              AM-PAC OT 6 Clicks Daily Activity     Outcome Measure Help from another person eating meals?: None Help from another person taking care of personal grooming?: A Little Help from another person toileting, which includes using toliet, bedpan, or urinal?: A Little Help from another person bathing (including washing, rinsing, drying)?: A Little Help from another person to put on and taking off regular upper body clothing?: None Help from another person to put on and taking off regular lower body clothing?: A Little 6 Click Score: 20   End of Session Equipment Utilized During Treatment: Gait belt;Back brace;Rolling walker (2 wheels) Nurse Communication: Mobility status  Activity Tolerance: Patient tolerated treatment well Patient left: in bed;with call bell/phone within reach  OT Visit Diagnosis: Unsteadiness on feet (R26.81);Pain Pain - part of body:  (back)                Time: 9258-9176 OT Time Calculation (min): 42 min Charges:  OT General Charges $OT Visit: 1 Visit OT Evaluation $OT Eval Low Complexity: 1 Low OT Treatments $Self Care/Home Management : 8-22 mins $Therapeutic Activity: 8-22 mins  04/09/2024  AB, OTR/L  Acute Rehabilitation Services  Office: [phone]   Curtistine BIRCH  Jsiah Menta 04/09/2024, 8:41 AM

## 2024-04-09 NOTE — Discharge Instructions (Addendum)
Wound Care Keep incision covered and dry until post op day 3. You may remove the Honeycomb dressing on post op day 3. Leave steri-strips on back.  They will fall off by themselves. Do not put any creams, lotions, or ointments on incision. You are fine to shower. Let water run over incision and pat dry.  Activity Walk each and every day, increasing distance each day. No lifting greater than 5 lbs.  Avoid excessive back motion. No driving for 2 weeks; may ride as a passenger locally.  Diet Resume your normal diet.   Return to Work Will be discussed at your follow up appointment.  Call Your Doctor If Any of These Occur Redness, drainage, or swelling at the wound.  Temperature greater than 101 degrees. Severe pain not relieved by pain medication. Incision starts to come apart.  Follow Up Appt Call [phone] today for appointment in 2-3 weeks if you don't already have one or for any problems.

## 2024-04-09 NOTE — Evaluation (Signed)
 Physical Therapy Evaluation Patient Details Name: Rachel Gates MRN: [id number] DOB: [birth year] Today's Date: 04/09/2024  History of Present Illness  Pt is a 68 yo female presenting to [facility] on 04/08/24 for elective back sx, s/p PLIF L2-L4.  PMH of HLD, HTN, obesity, anxiety, PONV.  Clinical Impression  Patient is s/p above surgery presenting with functional limitations due to the deficits listed below (see PT Problem List). Reviewed precautions. Able to transfer with supervision and ambulate with CGA (using RW for support). Educated on AD use to maximize support and safety at d/c. Shows some mild instability with gait. Plans to stay with her sister at d/c for additional help at needed during recover. May benefit from OPPT follow-up once cleared by surgeon to help with gait and balance recovery.  Patient will benefit from acute skilled PT to increase their independence and safety with mobility to facilitate discharge.         If plan is discharge home, recommend the following: A little help with walking and/or transfers;A little help with bathing/dressing/bathroom;Assistance with cooking/housework;Assist for transportation;Help with stairs or ramp for entrance   Can travel by private vehicle        Equipment Recommendations Rolling walker (2 wheels)  Recommendations for Other Services       Functional Status Assessment Patient has had a recent decline in their functional status and demonstrates the ability to make significant improvements in function in a reasonable and predictable amount of time.     Precautions / Restrictions Precautions Precautions: Back Precaution Booklet Issued: Yes (comment) Recall of Precautions/Restrictions: Intact Required Braces or Orthoses: Spinal Brace Spinal Brace: Lumbar corset;Applied in sitting position Restrictions Weight Bearing Restrictions Per Provider Order: No      Mobility  Bed Mobility               General bed mobility comments:  sitting EOB    Transfers Overall transfer level: Needs assistance Equipment used: Rolling walker (2 wheels) Transfers: Sit to/from Stand Sit to Stand: Supervision           General transfer comment: Supervision for safety without physical assist to rise, stabilized with RW for support.    Ambulation/Gait Ambulation/Gait assistance: Contact guard assist Gait Distance (Feet): 175 Feet Assistive device: Rolling walker (2 wheels) Gait Pattern/deviations: Step-through pattern, Decreased stride length, Decreased dorsiflexion - right, Decreased dorsiflexion - left, Drifts right/left, Steppage, Ataxic Gait velocity: dec Gait velocity interpretation: <1.8 ft/sec, indicate of risk for recurrent falls   General Gait Details: CGA for safety with mild instability initially, mostly with turning. Improved with cues for technique and proximity to walker + increased RW height adjustment. No overt buckling noted. Shows adequate ankle DF strength against gravity but  LE placement is a little ataxic. Educated on safety, awareness, and full time RW use at d/c.  Stairs Stairs:  (states she feels confident after reviewing with OT this morning)          Wheelchair Mobility     Tilt Bed    Modified Rankin (Stroke Patients Only)       Balance Overall balance assessment: Needs assistance Sitting-balance support: No upper extremity supported, Feet supported Sitting balance-Leahy Scale: Good     Standing balance support: During functional activity, No upper extremity supported Standing balance-Leahy Scale: Fair Standing balance comment: more stable with RW> Challenged with head turns, nods, and EC CGA for safety  Pertinent Vitals/Pain Pain Assessment Pain Assessment: Faces Faces Pain Scale: Hurts little more Pain Location: back, op site Pain Descriptors / Indicators: Aching, Discomfort Pain Intervention(s): Monitored during session, Limited  activity within patient's tolerance, Repositioned    Home Living Family/patient expects to be discharged to:: Private residence Living Arrangements: Alone Available Help at Discharge: Family;Available 24 hours/day Type of Home: House Home Access: Stairs to enter Entrance Stairs-Rails: Can reach both;Left;Right Entrance Stairs-Number of Steps: 7   Home Layout: One level Home Equipment: Shower seat;Grab bars - tub/shower;Hand held shower head;BSC/3in1 Additional Comments: Pt reports she will be staying with her sister at DC, homesetup based around sister. Pt did clarify that her home is also with similar equipment and setup but no stairs.    Prior Function Prior Level of Function : Independent/Modified Independent;Driving             Mobility Comments: no AD       Extremity/Trunk Assessment   Upper Extremity Assessment Upper Extremity Assessment: Defer to OT evaluation    Lower Extremity Assessment Lower Extremity Assessment: Generalized weakness    Cervical / Trunk Assessment Cervical / Trunk Assessment: Back Surgery  Communication   Communication Communication: No apparent difficulties    Cognition Arousal: Alert Behavior During Therapy: WFL for tasks assessed/performed   PT - Cognitive impairments: No apparent impairments                         Following commands: Intact       Cueing Cueing Techniques: Verbal cues     General Comments General comments (skin integrity, edema, etc.): Denies nausea    Exercises General Exercises - Lower Extremity Ankle Circles/Pumps: AROM, Both, 10 reps, Seated Quad Sets: Strengthening, Both, 10 reps, Seated   Assessment/Plan    PT Assessment Patient needs continued PT services  PT Problem List Decreased strength;Decreased range of motion;Decreased activity tolerance;Decreased balance;Decreased mobility;Decreased coordination;Decreased knowledge of use of DME;Decreased knowledge of precautions;Pain        PT Treatment Interventions DME instruction;Gait training;Stair training;Functional mobility training;Therapeutic activities;Therapeutic exercise;Balance training;Neuromuscular re-education;Patient/family education    PT Goals (Current goals can be found in the Care Plan section)  Acute Rehab PT Goals Patient Stated Goal: get well go home PT Goal Formulation: With patient Time For Goal Achievement: 04/15/24 Potential to Achieve Goals: Good    Frequency Min 2X/week     Co-evaluation               AM-PAC PT 6 Clicks Mobility  Outcome Measure Help needed turning from your back to your side while in a flat bed without using bedrails?: None Help needed moving from lying on your back to sitting on the side of a flat bed without using bedrails?: None Help needed moving to and from a bed to a chair (including a wheelchair)?: A Little Help needed standing up from a chair using your arms (e.g., wheelchair or bedside chair)?: A Little Help needed to walk in hospital room?: A Little Help needed climbing 3-5 steps with a railing? : A Little 6 Click Score: 20    End of Session   Activity Tolerance: Patient tolerated treatment well Patient left: in bed;with call bell/phone within reach Nurse Communication: Mobility status PT Visit Diagnosis: Unsteadiness on feet (R26.81);Other abnormalities of gait and mobility (R26.89);Muscle weakness (generalized) (M62.81);Difficulty in walking, not elsewhere classified (R26.2);Pain Pain - part of body:  (back)    Time: 0910-0920 PT Time Calculation (min) (ACUTE ONLY): 10 min  Charges:   PT Evaluation $PT Eval Low Complexity: 1 Low   PT General Charges $$ ACUTE PT VISIT: 1 Visit         Leontine Roads, PT, DPT [facility]  Rehabilitation Services Physical Therapist Office: [phone] Website: [email or web address]   Leontine GORMAN Roads 04/09/2024, 9:31 AM

## 2024-04-09 NOTE — Anesthesia Postprocedure Evaluation (Signed)
 Anesthesia Post Note  Patient: Rachel Gates  Procedure(s) Performed: POSTERIOR LUMBAR FUSION 2 LEVEL (Back)     Patient location during evaluation: PACU Anesthesia Type: General Level of consciousness: awake and alert Pain management: pain level controlled Vital Signs Assessment: post-procedure vital signs reviewed and stable Respiratory status: spontaneous breathing, nonlabored ventilation, respiratory function stable and patient connected to nasal cannula oxygen Cardiovascular status: blood pressure returned to baseline and stable Postop Assessment: no apparent nausea or vomiting Anesthetic complications: no   No notable events documented.  Last Vitals:  Vitals:   04/09/24 0624 04/09/24 0748  BP: (!) 115/59 108/65  Pulse: 75 84  Resp: 18 20  Temp: 37 C 36.8 C  SpO2: 98% 96%    Last Pain:  Vitals:   04/09/24 1225  TempSrc:   PainSc: 7                  Epifanio Lamar BRAVO

## 2024-04-09 NOTE — Discharge Summary (Signed)
 Physician Discharge Summary     Providing Compassionate, Quality Care - Together   Patient ID: Rachel Gates MRN: [id number] DOB/AGE: 04-08-1956 68 y.o.  Admit date: 04/08/2024 Discharge date: 04/09/2024  Admission Diagnoses: Spondylolisthesis and lateral listhesis with scoliosis L2-3 L3-4, spinal stenosis with back and leg pain   Discharge Diagnoses:  Principal Problem:   S/P lumbar fusion   Discharged Condition: good  Hospital Course: Patient underwent an L2-3, L3-4 PLIF by Dr. Joshua on 04/08/2024. She was admitted to 3C06 following recovery from anesthesia in the PACU. Her postoperative course has been uncomplicated. She has worked with both physical and occupational therapies who feel the patient is ready for discharge home. She is ambulating with the aid of a walker. She is tolerating a normal diet. She is not having any bowel or bladder dysfunction. Her pain is well-controlled with oral pain medication. She is ready for discharge home.   Consults: PT/OT/TOC  Significant Diagnostic Studies: radiology: DG Lumbar Spine 2-3 Views Result Date: 04/08/2024 CLINICAL DATA:  Elective surgery EXAM: LUMBAR SPINE - 2-3 VIEW COMPARISON:  Lumbar spine x-ray 03/10/2024 FINDINGS: Four intraoperative fluoroscopic views of the lumbar spine. 3 level lumbar spine posterior fusion hardware is present with disc spacers. Alignment appears anatomic on these limited views. IMPRESSION: Intraoperative fluoroscopic views of the lumbar spine. Electronically Signed   By: Greig Pique M.D.   On: 04/08/2024 17:10   DG C-Arm 1-60 Min-No Report Result Date: 04/08/2024 Fluoroscopy was utilized by the requesting physician.  No radiographic interpretation.   DG C-Arm 1-60 Min-No Report Result Date: 04/08/2024 Fluoroscopy was utilized by the requesting physician.  No radiographic interpretation.   DG C-Arm 1-60 Min-No Report Result Date: 04/08/2024 Fluoroscopy was utilized by the requesting physician.  No  radiographic interpretation.   DG C-Arm 1-60 Min-No Report Result Date: 04/08/2024 Fluoroscopy was utilized by the requesting physician.  No radiographic interpretation.     Treatments: surgery:   1. Decompressive lumbar laminectomy, hemi facetectomy and foraminotomies L2-3 L3-4 requiring more work than would be required for a simple exposure of the disk for PLIF in order to adequately decompress the neural elements and address the spinal stenosis 2. Posterior lumbar interbody fusion L2-3 L3-4 using PTI interbody cages packed with morcellized allograft and autograft 3. Posterior fixation L2-L4 inclusive utilizing ATEC cortical pedicle screws.  4. Intertransverse arthrodesis L2-L4 bilaterally using morcellized autograft and allograft.    Discharge Exam: Blood pressure 108/65, pulse 84, temperature 98.3 F (36.8 C), temperature source Oral, resp. rate 20, height 5' 7 (1.702 m), weight 107.2 kg, SpO2 96%.   Alert and oriented x 4 PERRLA CN II-XII grossly intact MAE, Strength and sensation intact Incision is covered with Honeycomb dressing and Steri Strips; Dressing is clean, dry, and intact   Disposition: Discharge disposition: 01-Home or Self Care       Discharge Instructions     Call MD for:  difficulty breathing, headache or visual disturbances   Complete by: As directed    Call MD for:  hives   Complete by: As directed    Call MD for:  persistant nausea and vomiting   Complete by: As directed    Call MD for:  redness, tenderness, or signs of infection (pain, swelling, redness, odor or green/yellow discharge around incision site)   Complete by: As directed    Call MD for:  severe uncontrolled pain   Complete by: As directed    Diet - low sodium heart healthy   Complete by: As directed  Discharge wound care:   Complete by: As directed    Remove dressing on post op day 7   Increase activity slowly   Complete by: As directed       Allergies as of 04/09/2024        Reactions   Prednisone  Other (See Comments)   Pt does not tolerate oral prednisone -causes severe migraine, body on fire, heart racing- Injections only        Medication List     STOP taking these medications    ibuprofen 800 MG tablet Commonly known as: ADVIL       TAKE these medications    albuterol  108 (90 Base) MCG/ACT inhaler Commonly known as: VENTOLIN  HFA Inhale 2 puffs into the lungs every 6 (six) hours as needed.   aspirin  EC 81 MG tablet Take 81 mg by mouth at bedtime.   atenolol  25 MG tablet Commonly known as: TENORMIN  TAKE 1 TABLET BY MOUTH DAILY   Biotin  5000 MCG Tabs Take 5,000 mcg by mouth daily.   CALCIUM CARBONATE PO Take 1,000 mg by mouth daily with breakfast.   citalopram  10 MG tablet Commonly known as: CELEXA  Take 1 tablet (10 mg total) by mouth daily.   cyanocobalamin  1000 MCG tablet Commonly known as: VITAMIN B12 Take 2,000 mcg by mouth daily.   fish oil-omega-3 fatty acids 1000 MG capsule Take 1 g by mouth daily.   furosemide  40 MG tablet Commonly known as: LASIX  Take 40 mg by mouth daily as needed for fluid or edema.   Gemtesa 75 MG Tabs Generic drug: Vibegron Take 75 mg by mouth daily.   levocetirizine 5 MG tablet Commonly known as: XYZAL  Take 5 mg by mouth every evening.   methocarbamol  500 MG tablet Commonly known as: ROBAXIN  Take 1 tablet (500 mg total) by mouth every 6 (six) hours as needed for muscle spasms.   multivitamin tablet Take 1 tablet by mouth daily.  Women   ondansetron  4 MG disintegrating tablet Commonly known as: ZOFRAN -ODT Take 1 tablet (4 mg total) by mouth every 8 (eight) hours as needed for nausea or vomiting. What changed: Another medication with the same name was added. Make sure you understand how and when to take each.   ondansetron  4 MG disintegrating tablet Commonly known as: ZOFRAN -ODT Take 1 tablet (4 mg total) by mouth every 8 (eight) hours as needed for nausea or vomiting. What  changed: You were already taking a medication with the same name, and this prescription was added. Make sure you understand how and when to take each.   oxyCODONE -acetaminophen  5-325 MG tablet Commonly known as: Percocet Take 1-2 tablets by mouth every 4 (four) hours as needed.   polyethylene glycol 17 g packet Commonly known as: MiraLax  Take 17 g by mouth daily.   promethazine -dextromethorphan 6.25-15 MG/5ML syrup Commonly known as: PROMETHAZINE -DM Take 5 mLs by mouth 4 (four) times daily as needed for cough.   simvastatin  40 MG tablet Commonly known as: ZOCOR  Take 1 tablet (40 mg total) by mouth at bedtime.   Vitamin D-3 125 MCG (5000 UT) Tabs Take 5,000 Units by mouth daily.               Durable Medical Equipment  (From admission, onward)           Start     Ordered   04/08/24 2018  DME Walker rolling  Once       Question:  Patient needs a walker to treat with the following condition  Answer:  S/P lumbar fusion   04/08/24 2017   04/08/24 2018  DME 3 n 1  Once        04/08/24 2017              Discharge Care Instructions  (From admission, onward)           Start     Ordered   04/09/24 0000  Discharge wound care:       Comments: Remove dressing on post op day 7   04/09/24 1124            Follow-up Information     Joshua Alm Hamilton, MD. Go on 04/19/2024.   Specialty: Neurosurgery Why: First post op appointment is on 04/19/2024 at 2:15 PM. Contact information: [address] [place] [address] [phone]                 Signed: Gerard Beck, DNP, AGNP-C Nurse Practitioner  [facility] [address], [address], [place], [place] [address] P: [phone]    F: [phone]  04/09/2024, 11:24 AM

## 2024-04-10 ENCOUNTER — Encounter (HOSPITAL_COMMUNITY): Payer: Self-pay

## 2024-04-10 ENCOUNTER — Other Ambulatory Visit: Payer: Self-pay

## 2024-04-10 ENCOUNTER — Emergency Department (HOSPITAL_COMMUNITY)

## 2024-04-10 ENCOUNTER — Observation Stay (HOSPITAL_COMMUNITY)
Admission: EM | Admit: 2024-04-10 | Discharge: 2024-04-12 | Disposition: A | Discharge status: Home / Self Care | Attending: Internal Medicine | Admitting: Internal Medicine

## 2024-04-10 DIAGNOSIS — M48061 Spinal stenosis, lumbar region without neurogenic claudication: Secondary | ICD-10-CM

## 2024-04-10 DIAGNOSIS — R519 Headache, unspecified: Secondary | ICD-10-CM | POA: Diagnosis not present

## 2024-04-10 DIAGNOSIS — G4489 Other headache syndrome: Secondary | ICD-10-CM | POA: Diagnosis not present

## 2024-04-10 DIAGNOSIS — I10 Essential (primary) hypertension: Secondary | ICD-10-CM | POA: Insufficient documentation

## 2024-04-10 DIAGNOSIS — E860 Dehydration: Secondary | ICD-10-CM | POA: Diagnosis not present

## 2024-04-10 DIAGNOSIS — F419 Anxiety disorder, unspecified: Secondary | ICD-10-CM | POA: Insufficient documentation

## 2024-04-10 DIAGNOSIS — Z79899 Other long term (current) drug therapy: Secondary | ICD-10-CM | POA: Diagnosis not present

## 2024-04-10 DIAGNOSIS — F32A Depression, unspecified: Secondary | ICD-10-CM | POA: Diagnosis not present

## 2024-04-10 DIAGNOSIS — Z9889 Other specified postprocedural states: Secondary | ICD-10-CM | POA: Diagnosis not present

## 2024-04-10 DIAGNOSIS — Z7982 Long term (current) use of aspirin: Secondary | ICD-10-CM | POA: Insufficient documentation

## 2024-04-10 DIAGNOSIS — G930 Cerebral cysts: Secondary | ICD-10-CM | POA: Insufficient documentation

## 2024-04-10 DIAGNOSIS — R1111 Vomiting without nausea: Secondary | ICD-10-CM | POA: Diagnosis not present

## 2024-04-10 DIAGNOSIS — R112 Nausea with vomiting, unspecified: Secondary | ICD-10-CM | POA: Diagnosis present

## 2024-04-10 DIAGNOSIS — E785 Hyperlipidemia, unspecified: Secondary | ICD-10-CM | POA: Insufficient documentation

## 2024-04-10 DIAGNOSIS — R110 Nausea: Secondary | ICD-10-CM | POA: Diagnosis not present

## 2024-04-10 LAB — CBC WITH DIFFERENTIAL/PLATELET
Abs Immature Granulocytes: 0.06 K/uL (ref 0.00–0.07)
Basophils Absolute: 0 K/uL (ref 0.0–0.1)
Basophils Relative: 0 %
Eosinophils Absolute: 0 K/uL (ref 0.0–0.5)
Eosinophils Relative: 0 %
HCT: 28.6 % — ABNORMAL LOW (ref 36.0–46.0)
Hemoglobin: 9.3 g/dL — ABNORMAL LOW (ref 12.0–15.0)
Immature Granulocytes: 1 %
Lymphocytes Relative: 12 %
Lymphs Abs: 1.3 K/uL (ref 0.7–4.0)
MCH: 32 pg (ref 26.0–34.0)
MCHC: 32.5 g/dL (ref 30.0–36.0)
MCV: 98.3 fL (ref 80.0–100.0)
Monocytes Absolute: 1.2 K/uL — ABNORMAL HIGH (ref 0.1–1.0)
Monocytes Relative: 11 %
Neutro Abs: 8.4 K/uL — ABNORMAL HIGH (ref 1.7–7.7)
Neutrophils Relative %: 76 %
Platelets: 163 K/uL (ref 150–400)
RBC: 2.91 MIL/uL — ABNORMAL LOW (ref 3.87–5.11)
RDW: 13.2 % (ref 11.5–15.5)
WBC: 11 K/uL — ABNORMAL HIGH (ref 4.0–10.5)
nRBC: 0 % (ref 0.0–0.2)

## 2024-04-10 LAB — COMPREHENSIVE METABOLIC PANEL WITH GFR
ALT: 19 U/L (ref 0–44)
AST: 35 U/L (ref 15–41)
Albumin: 3 g/dL — ABNORMAL LOW (ref 3.5–5.0)
Alkaline Phosphatase: 41 U/L (ref 38–126)
Anion gap: 8 (ref 5–15)
BUN: 11 mg/dL (ref 8–23)
CO2: 26 mmol/L (ref 22–32)
Calcium: 8.3 mg/dL — ABNORMAL LOW (ref 8.9–10.3)
Chloride: 104 mmol/L (ref 98–111)
Creatinine, Ser: 0.51 mg/dL (ref 0.44–1.00)
GFR, Estimated: >60 mL/min (ref >60)
Glucose, Bld: 126 mg/dL — ABNORMAL HIGH (ref 70–99)
Potassium: 3.9 mmol/L (ref 3.5–5.1)
Sodium: 138 mmol/L (ref 135–145)
Total Bilirubin: 0.8 mg/dL (ref 0.0–1.2)
Total Protein: 5.7 g/dL — ABNORMAL LOW (ref 6.5–8.1)

## 2024-04-10 LAB — PHOSPHORUS: Phosphorus: 2 mg/dL — ABNORMAL LOW (ref 2.5–4.6)

## 2024-04-10 LAB — MAGNESIUM: Magnesium: 2 mg/dL (ref 1.7–2.4)

## 2024-04-10 MED ORDER — ENOXAPARIN SODIUM 40 MG/0.4ML IJ SOSY
40.0000 mg | PREFILLED_SYRINGE | INTRAMUSCULAR | Status: DC
  Administered 2024-04-11 – 2024-04-12 (×2): 40 mg via SUBCUTANEOUS
  Filled 2024-04-10 (×2): qty 0.4

## 2024-04-10 MED ORDER — OXYCODONE-ACETAMINOPHEN 5-325 MG PO TABS
1.0000 | ORAL_TABLET | ORAL | Status: DC | PRN
  Administered 2024-04-11: 1 via ORAL
  Administered 2024-04-11 – 2024-04-12 (×3): 2 via ORAL
  Filled 2024-04-10 (×3): qty 2
  Filled 2024-04-10: qty 1

## 2024-04-10 MED ORDER — ACETAMINOPHEN 650 MG RE SUPP: 650.0000 mg | Freq: Four times a day (QID) | RECTAL | Status: DC | PRN

## 2024-04-10 MED ORDER — SIMVASTATIN 20 MG PO TABS
40.0000 mg | ORAL_TABLET | Freq: Every day | ORAL | Status: DC
  Administered 2024-04-11 (×2): 40 mg via ORAL
  Filled 2024-04-10 (×2): qty 2

## 2024-04-10 MED ORDER — ATENOLOL 25 MG PO TABS
25.0000 mg | ORAL_TABLET | Freq: Every day | ORAL | Status: DC
  Administered 2024-04-11 – 2024-04-12 (×2): 25 mg via ORAL
  Filled 2024-04-10 (×2): qty 1

## 2024-04-10 MED ORDER — ONDANSETRON HCL 4 MG/2ML IJ SOLN
4.0000 mg | Freq: Four times a day (QID) | INTRAMUSCULAR | Status: DC | PRN
Start: 2024-04-10 — End: 2024-04-12
  Administered 2024-04-10 – 2024-04-11 (×4): 4 mg via INTRAVENOUS
  Filled 2024-04-10 (×5): qty 2

## 2024-04-10 MED ORDER — HYDROMORPHONE HCL 1 MG/ML IJ SOLN
0.5000 mg | Freq: Four times a day (QID) | INTRAMUSCULAR | Status: DC | PRN
  Administered 2024-04-11 (×2): 0.5 mg via INTRAVENOUS
  Filled 2024-04-10: qty 1
  Filled 2024-04-10: qty 0.5

## 2024-04-10 MED ORDER — SODIUM CHLORIDE 0.9 % IV BOLUS
500.0000 mL | Freq: Once | INTRAVENOUS | Status: AC
  Administered 2024-04-10: 500 mL via INTRAVENOUS

## 2024-04-10 MED ORDER — ACETAMINOPHEN 325 MG PO TABS: 650.0000 mg | ORAL_TABLET | Freq: Four times a day (QID) | ORAL | Status: DC | PRN

## 2024-04-10 MED ORDER — HYDROMORPHONE HCL 1 MG/ML IJ SOLN
1.0000 mg | Freq: Once | INTRAMUSCULAR | Status: AC
  Administered 2024-04-10: 1 mg via INTRAVENOUS
  Filled 2024-04-10: qty 1

## 2024-04-10 MED ORDER — SENNOSIDES-DOCUSATE SODIUM 8.6-50 MG PO TABS
2.0000 | ORAL_TABLET | Freq: Every day | ORAL | Status: DC
  Administered 2024-04-11: 2 via ORAL
  Filled 2024-04-10: qty 2

## 2024-04-10 MED ORDER — BISACODYL 5 MG PO TBEC
5.0000 mg | DELAYED_RELEASE_TABLET | Freq: Every day | ORAL | Status: DC | PRN
  Administered 2024-04-11: 5 mg via ORAL
  Filled 2024-04-10: qty 1

## 2024-04-10 MED ORDER — SENNOSIDES-DOCUSATE SODIUM 8.6-50 MG PO TABS: 1.0000 | ORAL_TABLET | Freq: Every evening | ORAL | Status: DC | PRN

## 2024-04-10 MED ORDER — MIRABEGRON ER 25 MG PO TB24
25.0000 mg | ORAL_TABLET | Freq: Every day | ORAL | Status: DC
  Administered 2024-04-11 (×2): 25 mg via ORAL
  Filled 2024-04-10 (×2): qty 1

## 2024-04-10 MED ORDER — CITALOPRAM HYDROBROMIDE 10 MG PO TABS
10.0000 mg | ORAL_TABLET | Freq: Every day | ORAL | Status: DC
  Administered 2024-04-11 – 2024-04-12 (×2): 10 mg via ORAL
  Filled 2024-04-10 (×2): qty 1

## 2024-04-10 MED ORDER — METHOCARBAMOL 500 MG PO TABS
500.0000 mg | ORAL_TABLET | Freq: Four times a day (QID) | ORAL | Status: DC | PRN
  Administered 2024-04-11 – 2024-04-12 (×4): 500 mg via ORAL
  Filled 2024-04-10 (×4): qty 1

## 2024-04-10 MED ORDER — SODIUM CHLORIDE 0.9 % IV SOLN: INTRAVENOUS | Status: DC

## 2024-04-10 MED ORDER — ONDANSETRON HCL 4 MG PO TABS
4.0000 mg | ORAL_TABLET | Freq: Four times a day (QID) | ORAL | Status: DC | PRN
  Administered 2024-04-12: 4 mg via ORAL
  Filled 2024-04-10: qty 1

## 2024-04-10 MED ORDER — SODIUM CHLORIDE 0.9 % IV SOLN
12.5000 mg | Freq: Once | INTRAVENOUS | Status: AC
  Administered 2024-04-10: 12.5 mg via INTRAVENOUS
  Filled 2024-04-10: qty 12.5

## 2024-04-10 MED ORDER — ONDANSETRON 4 MG PO TBDP
4.0000 mg | ORAL_TABLET | Freq: Once | ORAL | Status: AC
  Administered 2024-04-10: 4 mg via ORAL
  Filled 2024-04-10: qty 1

## 2024-04-10 MED ORDER — POLYETHYLENE GLYCOL 3350 17 G PO PACK: 17.0000 g | PACK | Freq: Once | ORAL | Status: DC

## 2024-04-10 NOTE — H&P (Incomplete)
 History and Physical  Rachel Gates FMW:[id number] DOB: 1956/03/03 DOA: 04/10/2024  PCP: Katrinka Garnette KIDD, MD   Chief Complaint: Nausea and vomiting, headache  HPI: Rachel Gates is a 68 y.o. female with medical history significant for anxiety and depression, migraines, HTN, HLD, obesity, spinal stenosis s/p L2-3 and L3-4 lumbar laminectomy and fusion on 8/29, discharged yesterday and returns to the ED for evaluation of intractable nausea, vomiting and headache.  ED Course: Initial vitals show afebrile and normotensive. Initial labs significant for glucose 126, normal kidney function, WBC 11.0, Hgb 9.3. EKG shows sinus rhythm. CT head with no acute intracranial hemorrhage or infarct but shows findings suggestive of underlying arachnoid cyst. Pt received IV Dilaudid  1 mg x 1, IV Zofran  and IV NS 500 cc bolus. Neurosurgery was consulted for evaluation. [facility] was consulted for admission.   Review of Systems: Please see HPI for pertinent positives and negatives. A complete 10 system review of systems are otherwise negative.  Past Medical History:  Diagnosis Date   Allergy    Anxiety    Depression    Headache    Hyperlipidemia    Hypertension    Obese    PONV (postoperative nausea and vomiting)    Past Surgical History:  Procedure Laterality Date   ABDOMINAL HYSTERECTOMY     carpal tunnel surgery bilaterally     CHOLECYSTECTOMY     left foot fascia release     parotid gland surgery right side     Social History:  reports that she has never smoked. She has never used smokeless tobacco. She reports that she does not drink alcohol  and does not use drugs.  Allergies  Allergen Reactions   Prednisone  Other (See Comments)    Pt does not tolerate oral prednisone -causes severe migraine, body on fire, heart racing- Injections only    Family History  Problem Relation Age of Onset   Hypertension Mother    Angina Mother        no stents   Uterine cancer Mother    Alzheimer's disease  Mother        in facility since 16-May-2015. died sept 2020-05-15   Heart attack Father        mid 32s. died at 52   Glaucoma Father    Lymphoma Sister        in spinal fluid- at [facility] and doing ok 2023/05/16     Prior to Admission medications   Medication Sig Start Date End Date Taking? Authorizing Provider  albuterol  (VENTOLIN  HFA) 108 (90 Base) MCG/ACT inhaler Inhale 2 puffs into the lungs every 6 (six) hours as needed. Patient not taking: Reported on 03/25/2024 02/13/24   Leath-Warren, Etta PARAS, NP  aspirin  81 MG EC tablet Take 81 mg by mouth at bedtime.    [provider]  atenolol  (TENORMIN ) 25 MG tablet TAKE 1 TABLET BY MOUTH DAILY 08/11/23   Katrinka Garnette KIDD, MD  Biotin  5000 MCG TABS Take 5,000 mcg by mouth daily.    [provider]  Calcium Carbonate Antacid (CALCIUM CARBONATE PO) Take 1,000 mg by mouth daily with breakfast.    [provider]  Cholecalciferol (VITAMIN D-3) 125 MCG (5000 UT) TABS Take 5,000 Units by mouth daily.    [provider]  citalopram  (CELEXA ) 10 MG tablet Take 1 tablet (10 mg total) by mouth daily. 08/11/23   Katrinka Garnette KIDD, MD  fish oil-omega-3 fatty acids 1000 MG capsule Take 1 g by mouth daily.    [provider]  furosemide  (LASIX ) 40 MG tablet Take 40 mg by mouth daily as needed for fluid or edema. 11/02/23   [provider]  GEMTESA 75 MG TABS Take 75 mg by mouth daily. 09/26/21   [provider]  levocetirizine (XYZAL ) 5 MG tablet Take 5 mg by mouth every evening.    [provider]  methocarbamol  (ROBAXIN ) 500 MG tablet Take 1 tablet (500 mg total) by mouth every 6 (six) hours as needed for muscle spasms. 04/09/24   Bergman, Meghan D, NP  Multiple Vitamin (MULTIVITAMIN) tablet Take 1 tablet by mouth daily.  Women    [provider]  ondansetron  (ZOFRAN -ODT) 4 MG disintegrating tablet Take 1 tablet (4 mg total) by mouth every 8 (eight) hours as needed for nausea or vomiting. 06/12/23    Katrinka Garnette KIDD, MD  ondansetron  (ZOFRAN -ODT) 4 MG disintegrating tablet Take 1 tablet (4 mg total) by mouth every 8 (eight) hours as needed for nausea or vomiting. 04/09/24   Bergman, Meghan D, NP  oxyCODONE -acetaminophen  (PERCOCET) 5-325 MG tablet Take 1-2 tablets by mouth every 4 (four) hours as needed. 04/09/24 04/09/25  Bergman, Meghan D, NP  polyethylene glycol (MIRALAX ) 17 g packet Take 17 g by mouth daily. 04/09/24   Bergman, Meghan D, NP  promethazine -dextromethorphan (PROMETHAZINE -DM) 6.25-15 MG/5ML syrup Take 5 mLs by mouth 4 (four) times daily as needed for cough. 02/13/24   Leath-Warren, Etta PARAS, NP  simvastatin  (ZOCOR ) 40 MG tablet Take 1 tablet (40 mg total) by mouth at bedtime. 08/11/23   Katrinka Garnette KIDD, MD  vitamin B-12 (CYANOCOBALAMIN ) 1000 MCG tablet Take 2,000 mcg by mouth daily.    [provider]    Physical Exam: BP 121/71   Pulse 72   Temp 98.4 F (36.9 C) (Oral)   Resp 12   Ht 5' 7 (1.702 m)   Wt 107 kg   LMP  (LMP Unknown)   SpO2 98%   BMI 36.96 kg/m  General: Pleasant, well-appearing *** laying in bed. No acute distress. HEENT: /AT. Anicteric sclera CV: RRR. No murmurs, rubs, or gallops. No LE edema Pulmonary: Lungs CTAB. Normal effort. No wheezing or rales. Abdominal: Soft, nontender, nondistended. Normal bowel sounds. Extremities: Palpable radial and DP pulses. Normal ROM. Skin: Warm and dry. No obvious rash or lesions. Neuro: A&Ox3. Moves all extremities. Normal sensation to light touch. No focal deficit. Psych: Normal mood and affect          Labs on Admission:  Basic Metabolic Panel: Recent Labs  Lab 04/10/24 2008  NA 138  K 3.9  CL 104  CO2 26  GLUCOSE 126*  BUN 11  CREATININE 0.51  CALCIUM 8.3*   Liver Function Tests: Recent Labs  Lab 04/10/24 2008  AST 35  ALT 19  ALKPHOS 41  BILITOT 0.8  PROT 5.7*  ALBUMIN  3.0*   No results for input(s): LIPASE, AMYLASE in the last 168 hours. No results for input(s):  AMMONIA in the last 168 hours. CBC: Recent Labs  Lab 04/10/24 2008  WBC 11.0*  NEUTROABS 8.4*  HGB 9.3*  HCT 28.6*  MCV 98.3  PLT 163   Cardiac Enzymes: No results for input(s): CKTOTAL, CKMB, CKMBINDEX, TROPONINI in the last 168 hours. BNP (last 3 results) No results for input(s): BNP in the last 8760 hours.  ProBNP (last 3 results) No results for input(s): PROBNP in the last 8760 hours.  CBG: No results for input(s): GLUCAP in the last 168 hours.  Radiological Exams on Admission:  CT Head Wo Contrast Result Date: 04/10/2024 CLINICAL DATA:  Status post lumbar fusion, Headache, sudden, severe EXAM: CT HEAD WITHOUT CONTRAST TECHNIQUE: Contiguous axial images were obtained from the base of the skull through the vertex without intravenous contrast. RADIATION DOSE REDUCTION: This exam was performed according to the departmental dose-optimization program which includes automated exposure control, adjustment of the mA and/or kV according to patient size and/or use of iterative reconstruction technique. COMPARISON:  None Available. 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 Additionally there is associated mass effect, with 3-4 mm right to left midline shift. Together, the findings suggest the presence of an underlying arachnoid cyst centered within the right middle cranial fossa and extending into the sylvian fissure. There is mild pneumocephalus subjacent to the frontal convexities bilaterally which may be postsurgical in nature. No evidence of acute intracranial hemorrhage or infarct. No abnormal intra or extra-axial mass lesion. 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 Ventricular size is normal. Cerebellum is otherwise unremarkable. Vascular: No hyperdense vessel or unexpected calcification. Skull: Normal. Negative for fracture or focal lesion. Sinuses/Orbits: No acute finding.  Other: Mastoid air cells and middle ear cavities are clear. IMPRESSION: 1. No acute intracranial hemorrhage or infarct. 2. 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 Associated mass effect, with 3-4 mm right to left midline shift. Together, the findings suggest the presence of an underlying arachnoid cyst centered within the right middle cranial fossa and extending into the Sylvian fissure. This could be confirmed with MRI examination. 3. Mild pneumocephalus subjacent to the frontal convexities bilaterally which may be postsurgical in nature. 4. 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 Electronically Signed   By: Dorethia Molt M.D.   On: 04/10/2024 20:36   Assessment/Plan Xitlally Gates is a 68 y.o. female with medical history significant for anxiety and depression, migraines, HTN, HLD, obesity, spinal stenosis s/p L2-3 and L3-4 lumbar laminectomy and fusion on 8/29, discharged yesterday and returns to the ED for evaluation of intractable nausea, vomiting and headache.   #***  #***  #***  #***  #***  #***  #***  DVT prophylaxis: Lovenox      Code Status: Prior  Consults called: Neuro surgery  Family Communication: ***  Severity of Illness: The appropriate patient status for this patient is OBSERVATION. Observation status is judged to be reasonable and necessary in order to provide the required intensity of service to ensure the patient's safety. The patient's presenting symptoms, physical exam findings, and initial radiographic and laboratory data in the context of their medical condition is felt to place them at decreased risk for further clinical deterioration. Furthermore, it is anticipated that the patient will be medically stable for discharge from the hospital within 2 midnights of admission.   Level of care: Med-Surg   This record has been created using Conservation officer, historic buildings. Errors have been sought  and corrected, but may not always be located. Such creation errors do not reflect on the standard of care.   Lou Claretta HERO, MD 04/10/2024, 8:50 PM [facility] Pager: [phone] Isaiah 41:10   If 7PM-7AM, please contact night-coverage [email or web address] Password [email or web address]

## 2024-04-10 NOTE — ED Provider Notes (Addendum)
 [place] Provider Note   CSN: [id number] Arrival date & time: 04/10/24  1843     Patient presents with: No chief complaint on file.   Rachel Gates is a 68 y.o. female.   Patient status post surgery by neurosurgery patient had lumbar fusion and laminectomy.  Patient was discharged home yesterday.  Last evening she started with a severe headache.  Is having some back pain as well.  No real weakness in either leg.  She thought maybe there was weakness in the left leg.  But it really seems to be difficult to move it because of low back pain.  Patient having trouble with pain control and nausea and vomiting so not able to keep the pain medicine down.  Brought in by EMS.  Denies any fever.  Temp here 98.4 pulse 78 respirations 12 oxygen sats 97%.  Past medical history significant for hyperlipidemia anxiety depression hypertension headaches past surgical history December abdominal hysterectomy gallbladder removal.  Patient denies any leakage from the dressing.       Prior to Admission medications   Medication Sig Start Date End Date Taking? Authorizing Provider  albuterol  (VENTOLIN  HFA) 108 (90 Base) MCG/ACT inhaler Inhale 2 puffs into the lungs every 6 (six) hours as needed. Patient not taking: Reported on 03/25/2024 02/13/24   Leath-Warren, Etta PARAS, NP  aspirin  81 MG EC tablet Take 81 mg by mouth at bedtime.    [provider]  atenolol  (TENORMIN ) 25 MG tablet TAKE 1 TABLET BY MOUTH DAILY 08/11/23   Katrinka Garnette KIDD, MD  Biotin  5000 MCG TABS Take 5,000 mcg by mouth daily.    [provider]  Calcium Carbonate Antacid (CALCIUM CARBONATE PO) Take 1,000 mg by mouth daily with breakfast.    [provider]  Cholecalciferol (VITAMIN D-3) 125 MCG (5000 UT) TABS Take 5,000 Units by mouth daily.    [provider]  citalopram  (CELEXA ) 10 MG tablet Take 1 tablet (10 mg total) by mouth daily. 08/11/23   Katrinka Garnette KIDD, MD  fish oil-omega-3 fatty acids 1000 MG capsule Take 1 g by mouth daily.    [provider]  furosemide  (LASIX ) 40 MG tablet Take 40 mg by mouth daily as needed for fluid or edema. 11/02/23   [provider]  GEMTESA 75 MG TABS Take 75 mg by mouth daily. 09/26/21   [provider]  levocetirizine (XYZAL ) 5 MG tablet Take 5 mg by mouth every evening.    [provider]  methocarbamol  (ROBAXIN ) 500 MG tablet Take 1 tablet (500 mg total) by mouth every 6 (six) hours as needed for muscle spasms. 04/09/24   Bergman, Meghan D, NP  Multiple Vitamin (MULTIVITAMIN) tablet Take 1 tablet by mouth daily.  Women    [provider]  ondansetron  (ZOFRAN -ODT) 4 MG disintegrating tablet Take 1 tablet (4 mg total) by mouth every 8 (eight) hours as needed for nausea or vomiting. 06/12/23   Katrinka Garnette KIDD, MD  ondansetron  (ZOFRAN -ODT) 4 MG disintegrating tablet Take 1 tablet (4 mg total) by mouth every 8 (eight) hours as needed for nausea or vomiting. 04/09/24   Bergman, Meghan D, NP  oxyCODONE -acetaminophen  (PERCOCET) 5-325 MG tablet Take 1-2 tablets by mouth every 4 (four) hours as needed. 04/09/24 04/09/25  Bergman, Meghan D, NP  polyethylene glycol (MIRALAX ) 17 g packet Take 17 g by mouth daily. 04/09/24   Bergman, Meghan D, NP  promethazine -dextromethorphan (PROMETHAZINE -DM) 6.25-15 MG/5ML syrup Take 5  mLs by mouth 4 (four) times daily as needed for cough. 02/13/24   Leath-Warren, Etta PARAS, NP  simvastatin  (ZOCOR ) 40 MG tablet Take 1 tablet (40 mg total) by mouth at bedtime. 08/11/23   Katrinka Garnette KIDD, MD  vitamin B-12 (CYANOCOBALAMIN ) 1000 MCG tablet Take 2,000 mcg by mouth daily.    [provider]    Allergies: Prednisone     Review of Systems  Constitutional:  Negative for chills and fever.  HENT:  Negative for ear pain and sore throat.   Eyes:  Negative for pain and visual disturbance.  Respiratory:  Negative for cough and shortness of breath.    Cardiovascular:  Negative for chest pain and palpitations.  Gastrointestinal:  Positive for nausea and vomiting. Negative for abdominal pain.  Genitourinary:  Negative for dysuria and hematuria.  Musculoskeletal:  Positive for back pain. Negative for arthralgias.  Skin:  Negative for color change and rash.  Neurological:  Positive for headaches. Negative for seizures, syncope, weakness and numbness.  All other systems reviewed and are negative.   Updated Vital Signs BP 119/65   Pulse 77   Temp 98.4 F (36.9 C) (Oral)   Resp 15   Ht 1.702 m (5' 7)   Wt 107 kg   LMP  (LMP Unknown)   SpO2 97%   BMI 36.96 kg/m   Physical Exam Vitals and nursing note reviewed.  Constitutional:      General: She is not in acute distress.    Appearance: Normal appearance. She is well-developed.  HENT:     Head: Normocephalic and atraumatic.  Eyes:     Extraocular Movements: Extraocular movements intact.     Conjunctiva/sclera: Conjunctivae normal.     Pupils: Pupils are equal, round, and reactive to light.  Cardiovascular:     Rate and Rhythm: Normal rate and regular rhythm.     Heart sounds: No murmur heard. Pulmonary:     Effort: Pulmonary effort is normal. No respiratory distress.     Breath sounds: Normal breath sounds.  Abdominal:     Palpations: Abdomen is soft.     Tenderness: There is no abdominal tenderness.  Musculoskeletal:        General: No swelling.     Cervical back: Neck supple.     Comments: Wound dressing in place no obvious abnormalities no leakage of any significant fluid.  Skin:    General: Skin is warm and dry.     Capillary Refill: Capillary refill takes less than 2 seconds.  Neurological:     General: No focal deficit present.     Mental Status: She is alert and oriented to person, place, and time.     Cranial Nerves: No cranial nerve deficit.     Sensory: No sensory deficit.     Motor: No weakness.  Psychiatric:        Mood and Affect: Mood normal.      (all labs ordered are listed, but only abnormal results are displayed) Labs Reviewed  CBC WITH DIFFERENTIAL/PLATELET  COMPREHENSIVE METABOLIC PANEL WITH GFR    EKG: None  Radiology: No results found.   Procedures   Medications Ordered in the ED  0.9 %  sodium chloride  infusion (has no administration in time range)  sodium chloride  0.9 % bolus 500 mL (has no administration in time range)  ondansetron  (ZOFRAN -ODT) disintegrating tablet 4 mg (has no administration in time range)  HYDROmorphone  (DILAUDID ) injection 1 mg (has no administration in time range)  Medical Decision Making Amount and/or Complexity of Data Reviewed Labs: ordered. Radiology: ordered.  Risk Prescription drug management. Decision regarding hospitalization.   I will give patient pain medicine get head CT and then will take a look at the lumbar wound.  Will discuss with on-call neurosurgery.  Dr. Darnella is on for neurosurgery.  Patient's CBC white count 11 hemoglobin 9.3 hematocrit 28.6.  Platelets 163.  Complete metabolic panel pending.  Head CT pending.  Discussed with neurosurgery on-call.  He is not recommending MRI to evaluate the lumbar area.  Recommending hospitalist admission and they will consult.  Probably needs some IV fluids continued hospital pain control for the headache and antinausea medicine that is not working very well at home.  Will discuss with the hospitalist.  Patient followed by [facility].  Head CT had been reviewed by neurosurgery when I discussed it with them.  They did not see anything that they were overly alarmed about.  But the official reading does raise some concerns about a hypogenesis of anterior pole right temporal lobe and right frontal operculum associated mass effect with 3 to 4 mm right-to-left midline shift ago these findings suggest the presence of an underlying arachnoid cyst centered within the right middle cranial  fossa extending in the sylvian fissure.  This could be confirmed with MRI examination.  Mild pneumocephalus related to the surgery and remote lacunar infarct.  Admittedly neurosurgery made no mention about the concern for the arachnoid cyst but they did review her head CT.  And felt maybe there was a little bit of free air which has been confirmed by this.  They said just get hospitalist admit her IV fluids pain control antinausea medicine and they will follow her.  Discussed with hospitalist and they will admit. Final diagnoses:  Acute intractable headache, unspecified headache type  Status post lumbar surgery    ED Discharge Orders     None          Geraldene Hamilton, MD 04/10/24 ULYESS    Geraldene Hamilton, MD 04/10/24 2033    Geraldene Hamilton, MD 04/10/24 [id number]    Geraldene Hamilton, MD 04/10/24 2048

## 2024-04-10 NOTE — ED Triage Notes (Signed)
 According to [facility]: Pt is coming from home, 2 days post spinal fusion, pt awoke late last night with 8/10 headache. Pt has had intractable nausea vomiting all day. Pt unable to take any po medication or food. Pt unable to take zofran  on way to hospital with ems. Pt unable to take home meds including bp meds.  Pt had 500 cc of normal saline on ride to hospital.   22 gauge piv in right wrist. Vitals: Bp 110/66  to 130/70 Hr 66 sinus rhythm. RR 16 Spo2 88% room air, on 2l pt is 98%. CBG 135

## 2024-04-10 NOTE — ED Notes (Signed)
 Assumed care of patient at this time

## 2024-04-10 NOTE — ED Notes (Signed)
Pt having episode of vomiting. 

## 2024-04-11 DIAGNOSIS — E860 Dehydration: Secondary | ICD-10-CM

## 2024-04-11 DIAGNOSIS — R519 Headache, unspecified: Secondary | ICD-10-CM

## 2024-04-11 DIAGNOSIS — M48061 Spinal stenosis, lumbar region without neurogenic claudication: Secondary | ICD-10-CM

## 2024-04-11 DIAGNOSIS — R112 Nausea with vomiting, unspecified: Secondary | ICD-10-CM | POA: Diagnosis not present

## 2024-04-11 LAB — CBC
HCT: 31.2 % — ABNORMAL LOW (ref 36.0–46.0)
Hemoglobin: 9.9 g/dL — ABNORMAL LOW (ref 12.0–15.0)
MCH: 31.9 pg (ref 26.0–34.0)
MCHC: 31.7 g/dL (ref 30.0–36.0)
MCV: 100.6 fL — ABNORMAL HIGH (ref 80.0–100.0)
Platelets: 166 K/uL (ref 150–400)
RBC: 3.1 MIL/uL — ABNORMAL LOW (ref 3.87–5.11)
RDW: 13.2 % (ref 11.5–15.5)
WBC: 11.2 K/uL — ABNORMAL HIGH (ref 4.0–10.5)
nRBC: 0 % (ref 0.0–0.2)

## 2024-04-11 LAB — BASIC METABOLIC PANEL WITH GFR
Anion gap: 11 (ref 5–15)
BUN: 9 mg/dL (ref 8–23)
CO2: 24 mmol/L (ref 22–32)
Calcium: 8.4 mg/dL — ABNORMAL LOW (ref 8.9–10.3)
Chloride: 103 mmol/L (ref 98–111)
Creatinine, Ser: 0.49 mg/dL (ref 0.44–1.00)
GFR, Estimated: >60 mL/min (ref >60)
Glucose, Bld: 110 mg/dL — ABNORMAL HIGH (ref 70–99)
Potassium: 4.2 mmol/L (ref 3.5–5.1)
Sodium: 138 mmol/L (ref 135–145)

## 2024-04-11 LAB — HIV ANTIBODY (ROUTINE TESTING W REFLEX): HIV Screen 4th Generation wRfx: NONREACTIVE

## 2024-04-11 MED ORDER — KETOROLAC TROMETHAMINE 15 MG/ML IJ SOLN
15.0000 mg | Freq: Once | INTRAMUSCULAR | Status: AC
  Administered 2024-04-11: 15 mg via INTRAVENOUS
  Filled 2024-04-11: qty 1

## 2024-04-11 NOTE — Progress Notes (Signed)
 PROGRESS NOTE    Rachel Gates  FMW:[id number] DOB: [birth year] DOA: 04/10/2024 PCP: Katrinka Garnette KIDD, MD   Brief Narrative:   68 y.o. female with medical history significant for anxiety and depression, migraines, HTN, HLD, obesity, spinal stenosis s/p L2-3 and L3-4 lumbar laminectomy and fusion on 8/29, discharged yesterday and returns to the ED for evaluation of intractable nausea, vomiting and headache, likely acute migraine. Neurosurgery has been consulted.    Assessment & Plan:  Principal Problem:   Intractable nausea and vomiting Active Problems:   Status post lumbar surgery   Acute intractable headache   Spinal stenosis of lumbar region   Dehydration    69 y.o. female with medical history significant for anxiety and depression, migraines, HTN, HLD, obesity, spinal stenosis s/p L2-3 and L3-4 lumbar laminectomy and fusion on 8/29, discharged on 8/30 and returns to the ED on 8/31 for evaluation of intractable nausea, vomiting and headache.    Intractable nausea and vomiting, likely in the setting of acute migraine, improved - Continue IV NS at 100 cc/h - Started bowel regimen with MiraLAX  and Senokot-S for possible constipation - Antiemetic with PRN Zofran     Headache, likely acute migraine,POA: - CT head does not show any acute abnormalities but does show underlying arachnoid cyst - IV Toradol  has helped, continue as needed. -Avoid loud noises and bright lights.   Spinal stenosis - S/p L2-3 and L3-4 lumbar laminectomy and fusion on 8/29 - Reports improvement in her back pain, no signs of infection around incision site - Neurosurgery consulted - Pain control with as needed Robaxin  and Percocet   Arachnoid cyst - CT head shows presence of an underlying arachnoid cyst centered within the right middle cranial fossa and extending into the Sylvian fissure. - No previous CT head to compare - Neurosurgery following, appreciate recs   HTN - BP stable - Continue atenolol  -  prn IV Hydralazine for SBP > 160 mm Hg   Hyperlipidemia: - Continue rosuvastatin   Anxiety and major moderate depression - Continue Celexa   Disposition: Home    DVT prophylaxis: enoxaparin  (LOVENOX ) injection 40 mg Start: 04/11/24 1000     Code Status: Full Code Family Communication:  None at the bedside Status is: Observation The patient remains OBS appropriate and will d/c before 2 midnights.    Subjective:  Headache is better after she received IV toradol  this morning. Rating it as 5/10 now. She does have h/o migraines but hasn't had a headache since long. She used to take tylenol  migraine, caffeine and dark chocolate to help with acute migraine attacks. She is doing well in terms of postoperative recovery from her recent back surgery. She hasn't gotten enough sleep since surgery though.  Examination:  General exam: Appears calm and comfortable  Respiratory system: Clear to auscultation. Respiratory effort normal. Cardiovascular system: S1 & S2 heard, RRR. No JVD, murmurs, rubs, gallops or clicks. No pedal edema. Gastrointestinal system: Abdomen is nondistended, soft and nontender. No organomegaly or masses felt. Normal bowel sounds heard. Central nervous system: Alert and oriented. No focal neurological deficits. Extremities: Symmetric 5 x 5 power. Skin: No rashes, lesions or ulcers Psychiatry: Judgement and insight appear normal. Mood & affect appropriate.       Diet Orders (From admission, onward)     Start     Ordered   04/10/24 2130  Diet regular Room service appropriate? Yes; Fluid consistency: Thin  Diet effective now       Question Answer Comment  Room service appropriate?  Yes   Fluid consistency: Thin      04/10/24 2129            Objective: Vitals:   04/11/24 0400 04/11/24 0413 04/11/24 0604 04/11/24 0806  BP:  132/67 124/68 133/72  Pulse: 84 82 79 79  Resp:  16 18 19   Temp:  98.1 F (36.7 C) 98.7 F (37.1 C) 98.9 F (37.2 C)  TempSrc:   Oral  Oral  SpO2: 100% 100% 93% 91%  Weight:      Height:        Intake/Output Summary (Last 24 hours) at 04/11/2024 0924 Last data filed at 04/11/2024 0645 Gross per 24 hour  Intake 674.31 ml  Output 300 ml  Net 374.31 ml   Filed Weights   04/10/24 1855  Weight: 107 kg    Scheduled Meds:  atenolol   25 mg Oral Daily   citalopram   10 mg Oral Daily   enoxaparin  (LOVENOX ) injection  40 mg Subcutaneous Q24H   mirabegron  ER  25 mg Oral QHS   polyethylene glycol  17 g Oral Once   senna-docusate  2 tablet Oral QHS   simvastatin   40 mg Oral QHS   Continuous Infusions:  sodium chloride  100 mL/hr at 04/11/24 0147    Nutritional status     Body mass index is 36.96 kg/m.  Data Reviewed:   CBC: Recent Labs  Lab 04/10/24 2008 04/11/24 0329  WBC 11.0* 11.2*  NEUTROABS 8.4*  --   HGB 9.3* 9.9*  HCT 28.6* 31.2*  MCV 98.3 100.6*  PLT 163 166   Basic Metabolic Panel: Recent Labs  Lab 04/10/24 2008 04/11/24 0329  NA 138 138  K 3.9 4.2  CL 104 103  CO2 26 24  GLUCOSE 126* 110*  BUN 11 9  CREATININE 0.51 0.49  CALCIUM 8.3* 8.4*  MG 2.0  --   PHOS 2.0*  --    GFR: Estimated Creatinine Clearance: 83.6 mL/min (by C-G formula based on SCr of 0.49 mg/dL). Liver Function Tests: Recent Labs  Lab 04/10/24 2008  AST 35  ALT 19  ALKPHOS 41  BILITOT 0.8  PROT 5.7*  ALBUMIN  3.0*   No results for input(s): LIPASE, AMYLASE in the last 168 hours. No results for input(s): AMMONIA in the last 168 hours. Coagulation Profile: No results for input(s): INR, PROTIME in the last 168 hours. Cardiac Enzymes: No results for input(s): CKTOTAL, CKMB, CKMBINDEX, TROPONINI in the last 168 hours. BNP (last 3 results) No results for input(s): PROBNP in the last 8760 hours. HbA1C: No results for input(s): HGBA1C in the last 72 hours. CBG: No results for input(s): GLUCAP in the last 168 hours. Lipid Profile: No results for input(s): CHOL, HDL, LDLCALC,  TRIG, CHOLHDL, LDLDIRECT in the last 72 hours. Thyroid  Function Tests: No results for input(s): TSH, T4TOTAL, FREET4, T3FREE, THYROIDAB in the last 72 hours. Anemia Panel: No results for input(s): VITAMINB12, FOLATE, FERRITIN, TIBC, IRON, RETICCTPCT in the last 72 hours. Sepsis Labs: No results for input(s): PROCALCITON, LATICACIDVEN in the last 168 hours.  No results found for this or any previous visit (from the past 240 hours).       Radiology Studies: CT Head Wo Contrast Result Date: 04/10/2024 CLINICAL DATA:  Status post lumbar fusion, Headache, sudden, severe EXAM: CT HEAD WITHOUT CONTRAST TECHNIQUE: Contiguous axial images were obtained from the base of the skull through the vertex without intravenous contrast. RADIATION DOSE REDUCTION: This exam was performed according to the departmental dose-optimization program which  includes automated exposure control, adjustment of the mA and/or kV according to patient size and/or use of iterative reconstruction technique. COMPARISON:  None Available. 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 Additionally there is associated mass effect, with 3-4 mm right to left midline shift. Together, the findings suggest the presence of an underlying arachnoid cyst centered within the right middle cranial fossa and extending into the sylvian fissure. There is mild pneumocephalus subjacent to the frontal convexities bilaterally which may be postsurgical in nature. No evidence of acute intracranial hemorrhage or infarct. No abnormal intra or extra-axial mass lesion. 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 Ventricular size is normal. Cerebellum is otherwise unremarkable. Vascular: No hyperdense vessel or unexpected calcification. Skull: Normal. Negative for fracture or focal lesion. Sinuses/Orbits: No acute finding. Other: Mastoid air cells  and middle ear cavities are clear. IMPRESSION: 1. No acute intracranial hemorrhage or infarct. 2. 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 Associated mass effect, with 3-4 mm right to left midline shift. Together, the findings suggest the presence of an underlying arachnoid cyst centered within the right middle cranial fossa and extending into the Sylvian fissure. This could be confirmed with MRI examination. 3. Mild pneumocephalus subjacent to the frontal convexities bilaterally which may be postsurgical in nature. 4. 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 Electronically Signed   By: Dorethia Molt M.D.   On: 04/10/2024 20:36         LOS: 0 days   Time spent= 40 mins    Deliliah Room, MD [facility]  If 7PM-7AM, please contact night-coverage  04/11/2024, 9:24 AM

## 2024-04-11 NOTE — Plan of Care (Signed)
 Has been having some nausea but is finally trying to eat something and has been eating saltine crackers and drinking ginger ale.  Said she feels like her stomach is finally settling down.  Said she is unsure why this started, as she was doing well at home then all of a sudden she got sick.     Problem: Clinical Measurements: Goal: Will remain free from infection Outcome: Progressing   Problem: Clinical Measurements: Goal: Diagnostic test results will improve Outcome: Progressing   Problem: Clinical Measurements: Goal: Respiratory complications will improve Outcome: Progressing   Problem: Activity: Goal: Risk for activity intolerance will decrease Outcome: Progressing   Problem: Nutrition: Goal: Adequate nutrition will be maintained Outcome: Progressing   Problem: Skin Integrity: Goal: Risk for impaired skin integrity will decrease Outcome: Progressing   Problem: Safety: Goal: Ability to remain free from injury will improve Outcome: Progressing

## 2024-04-11 NOTE — Progress Notes (Signed)
 Patient arrived to room 22 via stretcher. Patient is alert and oriented. Patient transferred to the bed via stand and pivot. VS taken, patient oriented to room, assisted to the bedside commode, skin assessment done, white board updated, call bell within reach and bed locked in lowest position. Tele monitor applied and verified.   Patient belonging inventory done (slippers, charger, back brace and cell phone).

## 2024-04-11 NOTE — ED Notes (Signed)
 Report given to receiving nurse on 3W.

## 2024-04-12 DIAGNOSIS — R112 Nausea with vomiting, unspecified: Secondary | ICD-10-CM | POA: Diagnosis not present

## 2024-04-12 MED ORDER — PROCHLORPERAZINE EDISYLATE 10 MG/2ML IJ SOLN: 10.0000 mg | Freq: Once | INTRAMUSCULAR | Status: DC | PRN

## 2024-04-12 MED ORDER — KETOROLAC TROMETHAMINE 15 MG/ML IJ SOLN: 15.0000 mg | Freq: Four times a day (QID) | INTRAMUSCULAR | Status: DC | PRN

## 2024-04-12 NOTE — Discharge Summary (Signed)
 Physician Discharge Summary   Patient: Rachel Gates MRN: [id number] DOB: Dec 06, 1955  Admit date:     04/10/2024  Discharge date: 04/12/24  Discharge Physician: Deliliah Room   PCP: Katrinka Garnette KIDD, MD   Recommendations at discharge:    Follow up with your PCP in one week. Follow up with neurosurgeon Dr Joshua, on the scheduled appointment 9/9. Continue taking meds as prescribed  Discharge Diagnoses: Principal Problem:   Intractable nausea and vomiting Active Problems:   Status post lumbar surgery   Acute intractable headache   Spinal stenosis of lumbar region   Dehydration   Hospital Course:  68 y.o. female with medical history significant for anxiety and depression, migraines, HTN, HLD, obesity, spinal stenosis s/p L2-3 and L3-4 lumbar laminectomy and fusion on 8/29 by Dr Joshua, returned to the ED for evaluation of intractable nausea, vomiting and headache, likely acute migraine. CT head showed  presence of an underlying arachnoid cyst centered within the right middle cranial fossa and extending into the Sylvian fissure. - No previous CT head to compare. No focal neurological deficits. - Headache resolved with IV toradol . No vomiting episodes in the hospital. She was able to tolerate regular diet before discharge.  -She does have an appointment with neurosurgeon Dr Joshua, on 9/9.      Consultants: None Procedures performed: None  Disposition: Home Diet recommendation:  Regular diet DISCHARGE MEDICATION: Allergies as of 04/12/2024       Reactions   Prednisone  Other (See Comments)   Pt does not tolerate oral prednisone -causes severe migraine, body on fire, heart racing- Injections are tolerated        Medication List     TAKE these medications    aspirin  EC 81 MG tablet Take 81 mg by mouth at bedtime.   atenolol  25 MG tablet Commonly known as: TENORMIN  TAKE 1 TABLET BY MOUTH DAILY   Biotin  5000 MCG Tabs Take 5,000 mcg by mouth daily.   CALCIUM CARBONATE  PO Take 1,000 mg by mouth daily with breakfast.   citalopram  10 MG tablet Commonly known as: CELEXA  Take 1 tablet (10 mg total) by mouth daily.   cyanocobalamin  1000 MCG tablet Commonly known as: VITAMIN B12 Take 2,000 mcg by mouth daily.   fish oil-omega-3 fatty acids 1000 MG capsule Take 1 g by mouth daily.   furosemide  40 MG tablet Commonly known as: LASIX  Take 40 mg by mouth daily as needed for fluid or edema.   Gemtesa 75 MG Tabs Generic drug: Vibegron Take 75 mg by mouth daily.   levocetirizine 5 MG tablet Commonly known as: XYZAL  Take 5 mg by mouth every evening.   methocarbamol  500 MG tablet Commonly known as: ROBAXIN  Take 1 tablet (500 mg total) by mouth every 6 (six) hours as needed for muscle spasms.   multivitamin tablet Take 1 tablet by mouth daily.  Women   ondansetron  4 MG disintegrating tablet Commonly known as: ZOFRAN -ODT Take 1 tablet (4 mg total) by mouth every 8 (eight) hours as needed for nausea or vomiting.   ondansetron  4 MG disintegrating tablet Commonly known as: ZOFRAN -ODT Take 1 tablet (4 mg total) by mouth every 8 (eight) hours as needed for nausea or vomiting.   oxyCODONE -acetaminophen  5-325 MG tablet Commonly known as: Percocet Take 1-2 tablets by mouth every 4 (four) hours as needed.   polyethylene glycol 17 g packet Commonly known as: MiraLax  Take 17 g by mouth daily.   promethazine -dextromethorphan 6.25-15 MG/5ML syrup Commonly known as: PROMETHAZINE -DM Take 5 mLs  by mouth 4 (four) times daily as needed for cough.   simvastatin  40 MG tablet Commonly known as: ZOCOR  Take 1 tablet (40 mg total) by mouth at bedtime.   Vitamin D-3 125 MCG (5000 UT) Tabs Take 5,000 Units by mouth daily.        Follow-up Information     Katrinka Garnette KIDD, MD. Schedule an appointment as soon as possible for a visit in 1 week(s).   Specialty: Family Medicine Contact information: [address] [place]  [address] [phone]         Joshua Alm Hamilton, MD Follow up on 04/19/2024.   Specialty: Neurosurgery Contact information: [address] [place] [address] [phone]                Discharge Exam: Fredricka Weights   04/10/24 1855  Weight: 107 kg   Constitutional: NAD, calm, comfortable Eyes: PERRL, lids and conjunctivae normal ENMT: Mucous membranes are moist. Posterior pharynx clear of any exudate or lesions.Normal dentition.  Neck: normal, supple, no masses, no thyromegaly Respiratory: clear to auscultation bilaterally, no wheezing, no crackles. Normal respiratory effort. No accessory muscle use.  Cardiovascular: Regular rate and rhythm, no murmurs / rubs / gallops. No extremity edema. 2+ pedal pulses. No carotid bruits.  Abdomen: no tenderness, no masses palpated. No hepatosplenomegaly. Bowel sounds positive.  Musculoskeletal: no clubbing / cyanosis. No joint deformity upper and lower extremities. Good ROM, no contractures. Normal muscle tone.  Skin: no rashes, lesions, ulcers. No induration Neurologic: CN 2-12 grossly intact. Sensation intact, DTR normal. Strength 5/5 x all 4 extremities.  Psychiatric: Normal judgment and insight. Alert and oriented x 3. Normal mood.    Condition at discharge: good  The results of significant diagnostics from this hospitalization (including imaging, microbiology, ancillary and laboratory) are listed below for reference.   Imaging Studies: CT Head Wo Contrast Result Date: 04/10/2024 CLINICAL DATA:  Status post lumbar fusion, Headache, sudden, severe EXAM: CT HEAD WITHOUT CONTRAST TECHNIQUE: Contiguous axial images were obtained from the base of the skull through the vertex without intravenous contrast. RADIATION DOSE REDUCTION: This exam was performed according to the departmental dose-optimization program which includes automated exposure control, adjustment of the mA and/or kV according to patient size and/or use of  iterative reconstruction technique. COMPARISON:  None Available. 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 Additionally there is associated mass effect, with 3-4 mm right to left midline shift. Together, the findings suggest the presence of an underlying arachnoid cyst centered within the right middle cranial fossa and extending into the sylvian fissure. There is mild pneumocephalus subjacent to the frontal convexities bilaterally which may be postsurgical in nature. No evidence of acute intracranial hemorrhage or infarct. No abnormal intra or extra-axial mass lesion. 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 Ventricular size is normal. Cerebellum is otherwise unremarkable. Vascular: No hyperdense vessel or unexpected calcification. Skull: Normal. Negative for fracture or focal lesion. Sinuses/Orbits: No acute finding. Other: Mastoid air cells and middle ear cavities are clear. IMPRESSION: 1. No acute intracranial hemorrhage or infarct. 2. 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 Associated mass effect, with 3-4 mm right to left midline shift. Together, the findings suggest the presence of an underlying arachnoid cyst centered within the right middle cranial fossa and extending into the Sylvian fissure. This could be confirmed with MRI examination. 3. Mild pneumocephalus  subjacent to the frontal convexities bilaterally which may be postsurgical in nature. 4. 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 Electronically Signed   By: Dorethia Molt M.D.   On: 04/10/2024 20:36   DG Lumbar Spine 2-3 Views Result Date: 04/08/2024 CLINICAL DATA:  Elective surgery EXAM: LUMBAR SPINE - 2-3 VIEW COMPARISON:  Lumbar spine x-ray 03/10/2024 FINDINGS: Four intraoperative fluoroscopic views of the lumbar spine. 3 level lumbar spine posterior fusion hardware is present  with disc spacers. Alignment appears anatomic on these limited views. IMPRESSION: Intraoperative fluoroscopic views of the lumbar spine. Electronically Signed   By: Greig Pique M.D.   On: 04/08/2024 17:10   DG C-Arm 1-60 Min-No Report Result Date: 04/08/2024 Fluoroscopy was utilized by the requesting physician.  No radiographic interpretation.   DG C-Arm 1-60 Min-No Report Result Date: 04/08/2024 Fluoroscopy was utilized by the requesting physician.  No radiographic interpretation.   DG C-Arm 1-60 Min-No Report Result Date: 04/08/2024 Fluoroscopy was utilized by the requesting physician.  No radiographic interpretation.   DG C-Arm 1-60 Min-No Report Result Date: 04/08/2024 Fluoroscopy was utilized by the requesting physician.  No radiographic interpretation.    Microbiology: Results for orders placed or performed during the hospital encounter of 03/30/24  Surgical pcr screen     Status: None   Collection Time: 03/30/24  1:37 PM   Specimen: Nasal Mucosa; Nasal Swab  Result Value Ref Range Status   MRSA, PCR NEGATIVE NEGATIVE Final   Staphylococcus aureus NEGATIVE NEGATIVE Final    Comment: (NOTE) The Xpert SA Assay (FDA approved for NASAL specimens in patients 110 years of age and older), is one component of a comprehensive surveillance program. It is not intended to diagnose infection nor to guide or monitor treatment. Performed at [facility], [address] [place], [place] [address]     Labs: CBC: Recent Labs  Lab 04/10/24 2008 04/11/24 0329  WBC 11.0* 11.2*  NEUTROABS 8.4*  --   HGB 9.3* 9.9*  HCT 28.6* 31.2*  MCV 98.3 100.6*  PLT 163 166   Basic Metabolic Panel: Recent Labs  Lab 04/10/24 2008 04/11/24 0329  NA 138 138  K 3.9 4.2  CL 104 103  CO2 26 24  GLUCOSE 126* 110*  BUN 11 9  CREATININE 0.51 0.49  CALCIUM 8.3* 8.4*  MG 2.0  --   PHOS 2.0*  --    Liver Function Tests: Recent Labs  Lab 04/10/24 2008  AST 35  ALT 19  ALKPHOS 41   BILITOT 0.8  PROT 5.7*  ALBUMIN  3.0*   CBG: No results for input(s): GLUCAP in the last 168 hours.  Discharge time spent: 41 minutes.  Signed: Deliliah Room, MD [facility] 04/12/2024

## 2024-04-12 NOTE — Care Management Obs Status (Signed)
 MEDICARE OBSERVATION STATUS NOTIFICATION   Patient Details  Name: Rachel Gates MRN: [id number] Date of Birth: Dec 27, 1955   Medicare Observation Status Notification Given:  Yes    Andrez JULIANNA George, RN 04/12/2024, 10:18 AM

## 2024-04-12 NOTE — Progress Notes (Signed)
 Discharged to home after IV access removed and discharge instructions reviewed with by [facility] nurse Nat.  All questions answered. Said she had been nauseated but feels better.  Said she is more focused on her pain right now.  Was given Robaxin  po prior to dc.

## 2024-04-12 NOTE — Plan of Care (Signed)

## 2024-04-12 NOTE — TOC Transition Note (Signed)
 Transition of Care [facility]) - Discharge Note   Patient Details  Name: Vola [place]and MRN: [id number] Date of Birth: [birth year]  Transition of Care [facility] CM/SW Contact:  Andrez JULIANNA George, RN Phone Number: 04/12/2024, 10:18 AM   Clinical Narrative:     Pt is discharging home with self care. No needs per IP Care management.  Final next level of care: Home/Self Care Barriers to Discharge: No Barriers Identified   Patient Goals and CMS Choice            Discharge Placement                       Discharge Plan and Services Additional resources added to the After Visit Summary for                                       Social Drivers of Health (SDOH) Interventions SDOH Screenings   Food Insecurity: No Food Insecurity (04/11/2024)  Housing: Low Risk  (04/11/2024)  Transportation Needs: No Transportation Needs (04/11/2024)  Utilities: Not At Risk (04/11/2024)  Depression (PHQ2-9): Low Risk  (12/22/2023)  Financial Resource Strain: Low Risk  (08/31/2023)  Physical Activity: Insufficiently Active (08/31/2023)  Social Connections: Socially Isolated (04/11/2024)  Stress: No Stress Concern Present (08/31/2023)  Tobacco Use: Low Risk  (04/10/2024)     Readmission Risk Interventions     No data to display

## 2024-04-18 MED FILL — Sodium Chloride IV Soln 0.9%: INTRAVENOUS | Qty: 2000 | Status: AC

## 2024-05-05 DIAGNOSIS — M4316 Spondylolisthesis, lumbar region: Secondary | ICD-10-CM | POA: Diagnosis not present

## 2024-05-18 ENCOUNTER — Encounter: Payer: Self-pay | Admitting: Family Medicine

## 2024-05-24 DIAGNOSIS — Z1231 Encounter for screening mammogram for malignant neoplasm of breast: Secondary | ICD-10-CM | POA: Diagnosis not present

## 2024-05-30 ENCOUNTER — Other Ambulatory Visit: Payer: Self-pay | Admitting: Obstetrics and Gynecology

## 2024-05-30 DIAGNOSIS — R928 Other abnormal and inconclusive findings on diagnostic imaging of breast: Secondary | ICD-10-CM

## 2024-06-14 ENCOUNTER — Other Ambulatory Visit: Payer: Self-pay | Admitting: Obstetrics and Gynecology

## 2024-06-14 ENCOUNTER — Ambulatory Visit
Admission: RE | Admit: 2024-06-14 | Discharge: 2024-06-14 | Disposition: A | Source: Ambulatory Visit | Attending: Obstetrics and Gynecology | Admitting: Obstetrics and Gynecology

## 2024-06-14 ENCOUNTER — Ambulatory Visit
Admission: RE | Admit: 2024-06-14 | Discharge: 2024-06-14 | Disposition: A | Discharge status: Home / Self Care | Source: Ambulatory Visit | Attending: Obstetrics and Gynecology | Admitting: Obstetrics and Gynecology

## 2024-06-14 DIAGNOSIS — R928 Other abnormal and inconclusive findings on diagnostic imaging of breast: Secondary | ICD-10-CM

## 2024-06-15 ENCOUNTER — Ambulatory Visit
Admission: RE | Admit: 2024-06-15 | Discharge: 2024-06-15 | Disposition: A | Discharge status: Home / Self Care | Source: Ambulatory Visit | Attending: Obstetrics and Gynecology | Admitting: Obstetrics and Gynecology

## 2024-06-15 DIAGNOSIS — R928 Other abnormal and inconclusive findings on diagnostic imaging of breast: Secondary | ICD-10-CM

## 2024-06-15 HISTORY — PX: BREAST BIOPSY: SHX20

## 2024-06-16 LAB — SURGICAL PATHOLOGY

## 2024-07-24 ENCOUNTER — Other Ambulatory Visit: Payer: Self-pay | Admitting: Family Medicine

## 2024-07-24 DIAGNOSIS — F3342 Major depressive disorder, recurrent, in full remission: Secondary | ICD-10-CM

## 2024-08-28 ENCOUNTER — Other Ambulatory Visit: Payer: Self-pay | Admitting: Family Medicine

## 2024-08-28 DIAGNOSIS — E785 Hyperlipidemia, unspecified: Secondary | ICD-10-CM

## 2024-09-01 ENCOUNTER — Ambulatory Visit: Payer: Medicare Other

## 2024-09-01 VITALS — BP 118/74 | HR 74 | Temp 97.2°F | Ht 67.5 in | Wt 231.2 lb

## 2024-09-01 DIAGNOSIS — Z0000 Encounter for general adult medical examination without abnormal findings: Secondary | ICD-10-CM | POA: Diagnosis not present

## 2024-09-01 NOTE — Progress Notes (Signed)
 "  Chief Complaint  Patient presents with   Medicare Wellness     Subjective:   Rachel Gates is a 69 y.o. female who presents for a Medicare Annual Wellness Visit.  Visit info / Clinical Intake: Medicare Wellness Visit Type:: Subsequent Annual Wellness Visit Persons participating in visit and providing information:: patient Medicare Wellness Visit Mode:: In-person (required for WTM) Interpreter Needed?: No Pre-visit prep was completed: yes AWV questionnaire completed by patient prior to visit?: no Living arrangements:: (!) lives alone Patient's Overall Health Status Rating: good Typical amount of pain: some Does pain affect daily life?: (!) yes (left foot) Are you currently prescribed opioids?: no  Dietary Habits and Nutritional Risks How many meals a day?: 3 Eats fruit and vegetables daily?: yes Most meals are obtained by: preparing own meals; eating out In the last 2 weeks, have you had any of the following?: none Diabetic:: no  Functional Status Activities of Daily Living (to include ambulation/medication): Independent Ambulation: Independent with device- listed below Home Assistive Devices/Equipment: Eyeglasses Medication Administration: Independent Home Management (perform basic housework or laundry): Independent Manage your own finances?: yes Primary transportation is: driving Concerns about vision?: no *vision screening is required for WTM* Concerns about hearing?: no  Fall Screening Falls in the past year?: 0 Number of falls in past year: 0 Was there an injury with Fall?: 0 Fall Risk Category Calculator: 0 Patient Fall Risk Level: Low Fall Risk  Fall Risk Patient at Risk for Falls Due to: No Fall Risks Fall risk Follow up: Falls evaluation completed  Home and Transportation Safety: All rugs have non-skid backing?: N/A, no rugs All stairs or steps have railings?: yes Grab bars in the bathtub or shower?: yes Have non-skid surface in bathtub or shower?:  yes Good home lighting?: yes Regular seat belt use?: yes Hospital stays in the last year:: (!) yes How many hospital stays:: 2 Reason: lumbar fusion  Cognitive Assessment Difficulty concentrating, remembering, or making decisions? : no Will 6CIT or Mini Cog be Completed: yes What year is it?: 0 points What month is it?: 0 points Give patient an address phrase to remember (5 components): [address] [place]  About what time is it?: 0 points Count backwards from 20 to 1: 0 points Say the months of the year in reverse: 0 points Repeat the address phrase from earlier: 0 points 6 CIT Score: 0 points  Advance Directives (For Healthcare) Does Patient Have a Medical Advance Directive?: No Would patient like information on creating a medical advance directive?: Yes (MAU/Ambulatory/Procedural Areas - Information given)  Reviewed/Updated  Reviewed/Updated: Reviewed All (Medical, Surgical, Family, Medications, Allergies, Care Teams, Patient Goals)    Allergies (verified) Prednisone    Current Medications (verified) Outpatient Encounter Medications as of 09/01/2024  Medication Sig   aspirin  81 MG EC tablet Take 81 mg by mouth at bedtime.   atenolol  (TENORMIN ) 25 MG tablet TAKE 1 TABLET BY MOUTH DAILY   Biotin  5000 MCG TABS Take 5,000 mcg by mouth daily.   Calcium Carbonate Antacid (CALCIUM CARBONATE PO) Take 1,000 mg by mouth daily with breakfast.   Cholecalciferol (VITAMIN D-3) 125 MCG (5000 UT) TABS Take 5,000 Units by mouth daily.   citalopram  (CELEXA ) 10 MG tablet TAKE 1 TABLET BY MOUTH DAILY   fish oil-omega-3 fatty acids 1000 MG capsule Take 1 g by mouth daily.   furosemide  (LASIX ) 40 MG tablet Take 40 mg by mouth daily as needed for fluid or edema.   GEMTESA 75 MG TABS Take 75  mg by mouth daily.   levocetirizine (XYZAL ) 5 MG tablet Take 5 mg by mouth every evening.   Multiple Vitamin (MULTIVITAMIN) tablet Take 1 tablet by mouth daily.  Women   ondansetron  (ZOFRAN -ODT) 4 MG  disintegrating tablet Take 1 tablet (4 mg total) by mouth every 8 (eight) hours as needed for nausea or vomiting.   polyethylene glycol (MIRALAX ) 17 g packet Take 17 g by mouth daily.   simvastatin  (ZOCOR ) 40 MG tablet TAKE 1 TABLET BY MOUTH AT  BEDTIME   vitamin B-12 (CYANOCOBALAMIN ) 1000 MCG tablet Take 2,000 mcg by mouth daily.   [DISCONTINUED] methocarbamol  (ROBAXIN ) 500 MG tablet Take 1 tablet (500 mg total) by mouth every 6 (six) hours as needed for muscle spasms.   [DISCONTINUED] ondansetron  (ZOFRAN -ODT) 4 MG disintegrating tablet Take 1 tablet (4 mg total) by mouth every 8 (eight) hours as needed for nausea or vomiting.   [DISCONTINUED] oxyCODONE -acetaminophen  (PERCOCET) 5-325 MG tablet Take 1-2 tablets by mouth every 4 (four) hours as needed.   [DISCONTINUED] promethazine -dextromethorphan (PROMETHAZINE -DM) 6.25-15 MG/5ML syrup Take 5 mLs by mouth 4 (four) times daily as needed for cough. (Patient not taking: Reported on 04/10/2024)   No facility-administered encounter medications on file as of 09/01/2024.    History: Past Medical History:  Diagnosis Date   Allergy    Anxiety    Depression    Headache    Hyperlipidemia    Hypertension    Obese    PONV (postoperative nausea and vomiting)    Past Surgical History:  Procedure Laterality Date   ABDOMINAL HYSTERECTOMY     BREAST BIOPSY Right 06/15/2024   US  RT BREAST BX W LOC DEV EA ADD LESION IMG BX SPEC US  GUIDE 06/15/2024 [facility]   BREAST BIOPSY Right 06/15/2024   US  RT BREAST BX W LOC DEV 1ST LESION IMG BX SPEC US  GUIDE 06/15/2024 [facility]   carpal tunnel surgery bilaterally     CHOLECYSTECTOMY     left foot fascia release     parotid gland surgery right side     Family History  Problem Relation Age of Onset   Hypertension Mother    Angina Mother        no stents   Uterine cancer Mother    Alzheimer's disease Mother        in facility since Oct 10, 2014. died sept 11-Oct-2019   Heart attack Father        mid 43s.  died at 74   Glaucoma Father    Lymphoma Sister        in spinal fluid- at [facility] and doing ok 10/10/22   Social History   Occupational History   Occupation: Quarry Manager: [employer]  Tobacco Use   Smoking status: Never   Smokeless tobacco: Never  Vaping Use   Vaping status: Never Used  Substance and Sexual Activity   Alcohol  use: No   Drug use: No   Sexual activity: Not on file   Tobacco Counseling Counseling given: Not Answered  SDOH Screenings   Food Insecurity: No Food Insecurity (09/01/2024)  Housing: Low Risk (09/01/2024)  Transportation Needs: No Transportation Needs (09/01/2024)  Utilities: Not At Risk (09/01/2024)  Alcohol  Screen: Low Risk (09/01/2024)  Depression (PHQ2-9): Low Risk (09/01/2024)  Financial Resource Strain: Low Risk (08/31/2023)  Physical Activity: Sufficiently Active (09/01/2024)  Social Connections: Socially Isolated (09/01/2024)  Stress: No Stress Concern Present (09/01/2024)  Tobacco Use: Low Risk (09/01/2024)  Health Literacy: Adequate Health Literacy (09/01/2024)  See flowsheets for full screening details  Depression Screen PHQ 2 & 9 Depression Scale- Over the past 2 weeks, how often have you been bothered by any of the following problems? Little interest or pleasure in doing things: 0 Feeling down, depressed, or hopeless (PHQ Adolescent also includes...irritable): 0 PHQ-2 Total Score: 0 Trouble falling or staying asleep, or sleeping too much: 0 Feeling tired or having little energy: 0 Poor appetite or overeating (PHQ Adolescent also includes...weight loss): 0 Feeling bad about yourself - or that you are a failure or have let yourself or your family down: 0 Trouble concentrating on things, such as reading the newspaper or watching television (PHQ Adolescent also includes...like school work): 0 Moving or speaking so slowly that other people could have noticed. Or the opposite - being so fidgety or restless that you have been moving around a  lot more than usual: 0 Thoughts that you would be better off dead, or of hurting yourself in some way: 0 PHQ-9 Total Score: 0 If you checked off any problems, how difficult have these problems made it for you to do your work, take care of things at home, or get along with other people?: Not difficult at all     Goals Addressed               This Visit's Progress     Patient Stated        Weight (lb) < 185 lb (83.9 kg) (pt-stated)   231 lb 3.2 oz (104.9 kg)            Objective:    Today's Vitals   09/01/24 1310  BP: 118/74  Pulse: 74  Temp: (!) 97.2 F (36.2 C)  SpO2: 93%  Weight: 231 lb 3.2 oz (104.9 kg)  Height: 5' 7.5 (1.715 m)   Body mass index is 35.68 kg/m.  Hearing/Vision screen No results found. Immunizations and Health Maintenance Health Maintenance  Topic Date Due   DTaP/Tdap/Td (3 - Td or Tdap) 05/06/2023   COVID-19 Vaccine (8 - Pfizer risk 2025-26 season) 11/16/2024   Mammogram  05/17/2025   Medicare Annual Wellness (AWV)  09/01/2025   Colonoscopy  10/17/2026   Pneumococcal Vaccine: 50+ Years  Completed   Influenza Vaccine  Completed   Bone Density Scan  Completed   Hepatitis C Screening  Completed   Zoster Vaccines- Shingrix  Completed   Meningococcal B Vaccine  Aged Out   Hepatitis B Vaccines 19-59 Average Risk  Discontinued        Assessment/Plan:  This is a routine wellness examination for [place].  Patient Care Team: Katrinka Garnette KIDD, MD as PCP - General (Family Medicine) Gaston Hamilton, MD as Consulting Physician (Urology) Curlene Agent, MD as Consulting Physician (Obstetrics and Gynecology) Joshua Alm Hamilton, MD as Consulting Physician (Neurosurgery) Oman, Heather, OD [facility])  I have personally reviewed and noted the following in the patients chart:   Medical and social history Use of alcohol , tobacco or illicit drugs  Current medications and supplements including opioid prescriptions. Functional ability and  status Nutritional status Physical activity Advanced directives List of other physicians Hospitalizations, surgeries, and ER visits in previous 12 months Vitals Screenings to include cognitive, depression, and falls Referrals and appointments  No orders of the defined types were placed in this encounter.  In addition, I have reviewed and discussed with patient certain preventive protocols, quality metrics, and best practice recommendations. A written personalized care plan for preventive services as well as general preventive health recommendations  were provided to patient.   Ellouise VEAR Haws, LPN   [id number]   Return in about 1 year (around 09/04/2025).  After Visit Summary: (In Person-Printed) AVS printed and given to the patient  Nurse Notes: HM Addressed: Pt stated Tdap will call back or enter in my chart  "

## 2024-09-14 ENCOUNTER — Ambulatory Visit: Payer: Self-pay

## 2024-09-14 NOTE — Telephone Encounter (Signed)
 Appt tomorrow

## 2024-09-14 NOTE — Telephone Encounter (Signed)
 FYI Only or Action Required?: FYI only for provider: appointment scheduled on 2/5.  Patient was last seen in primary care on 12/22/2023 by Kennyth Worth HERO, MD.  Called Nurse Triage reporting Peripheral Neuropathy and Leg Pain.  Symptoms began several weeks ago.  Interventions attempted: OTC medications: Tylenol  and aleve, DMSO cream and Prescription medications: Gabapentin .  Symptoms are: gradually worsening.  Triage Disposition: See Physician Within 24 Hours  Patient/caregiver understands and will follow disposition?: Yes      Burning numb pain in BLE from feet to knees a few weeks ago. 7-8/10 pain at night, 2/10 pain during the day. No CP or SOB. No swelling or discoloration. No recent strain or injury. No history or diabetes. Able to walk around. No fever. Had back surgery in August with similar fleeting symptoms.   Scheduled appt with different provider at home office tomorrow d/t no PCP availability within timeframe. Advised UC or ED for worsening symptoms.        Reason for Triage: Pt has neuropathy in left foot and legs. She has pain in her foot and it is keeping her up at night.   Reason for Disposition  Numbness in a leg or foot (i.e., loss of sensation)    Numb burning pain in BLE  Answer Assessment - Initial Assessment Questions 1. ONSET: When did the pain start?      A few weeks ago  2. LOCATION: Where is the pain located?       BLE from feet to knees  3. PAIN: How bad is the pain?    (Scale 1-10; or mild, moderate, severe)     7-8/10 pain at night, 2/10 pain during the day. Burning numb pain.   4. WORK OR EXERCISE: Has there been any recent work or exercise that involved this part of the body?      Denies  5. CAUSE: What do you think is causing the leg pain?     Unsure  6. OTHER SYMPTOMS: Do you have any other symptoms? (e.g., chest pain, back pain, breathing difficulty, swelling, rash, fever, numbness, weakness)     Denies  Protocols  used: Leg Pain-A-AH

## 2024-09-14 NOTE — Telephone Encounter (Signed)
" ° ° ° ° ° ° °  Attempt #1 -  When [facility] the PAS warm transferred the call, the pt was disconnected. This RN called the pt back at [phone] and the call was answered, then disconnected.            Reason for Triage: Pt has neuropathy in left foot and legs. She has pain in her foot and it is keeping her up at night.   "

## 2024-09-15 ENCOUNTER — Encounter: Payer: Self-pay | Admitting: Family Medicine

## 2024-09-15 ENCOUNTER — Ambulatory Visit: Admitting: Family Medicine

## 2024-09-15 VITALS — BP 130/80 | HR 72 | Temp 97.4°F | Ht 67.5 in | Wt 231.8 lb

## 2024-09-15 DIAGNOSIS — M5416 Radiculopathy, lumbar region: Secondary | ICD-10-CM

## 2024-09-15 MED ORDER — METHYLPREDNISOLONE ACETATE 80 MG/ML IJ SUSP
80.0000 mg | Freq: Once | INTRAMUSCULAR | Status: AC
  Administered 2024-09-15: 80 mg via INTRAMUSCULAR

## 2024-09-15 MED ORDER — GABAPENTIN 300 MG PO CAPS: 300.0000 mg | ORAL_CAPSULE | Freq: Three times a day (TID) | ORAL | 3 refills | Status: AC

## 2024-09-15 NOTE — Patient Instructions (Signed)
 It was very nice to see you today!  VISIT SUMMARY: During your visit, we discussed the worsening of your chronic lumbar radiculopathy, which is causing burning and numb pain in your lower extremities. We adjusted your medication and administered a cortisone injection to help manage your symptoms.  YOUR PLAN: LUMBAR RADICULOPATHY: Your chronic lumbar radiculopathy has worsened, likely due to nerve inflammation from previous spinal stenosis and disc compression. -We administered a cortisone injection to help reduce inflammation. -Gabapentin  is now prescribed at 300 mg. Start with one dose at night and increase to two or three times daily as needed for symptom control. -Follow up with neurosurgeon Dr. Joshua for further evaluation and management. -If your symptoms do not improve, please check in next week.  Return if symptoms worsen or fail to improve.   Take care, Dr Kennyth  PLEASE NOTE:  If you had any lab tests, please let us  know if you have not heard back within a few days. You may see your results on mychart before we have a chance to review them but we will give you a call once they are reviewed by us .   If we ordered any referrals today, please let us  know if you have not heard from their office within the next week.   If you had any urgent prescriptions sent in today, please check with the pharmacy within an hour of our visit to make sure the prescription was transmitted appropriately.   Please try these tips to maintain a healthy lifestyle:  Eat at least 3 REAL meals and 1-2 snacks per day.  Aim for no more than 5 hours between eating.  If you eat breakfast, please do so within one hour of getting up.   Each meal should contain half fruits/vegetables, one quarter protein, and one quarter carbs (no bigger than a computer mouse)  Cut down on sweet beverages. This includes juice, soda, and sweet tea.   Drink at least 1 glass of water  with each meal and aim for at least 8 glasses  per day  Exercise at least 150 minutes every week.

## 2024-09-15 NOTE — Progress Notes (Signed)
 "  Rachel Gates is a 69 y.o. female who presents today for an office visit.  Assessment/Plan:  Lumbar radiculopathy Patient with a few weeks of worsening neuropathic pain in bilateral lower extremities left worse than right consistent with a flareup of her lumbar radiculopathy.  Positive straight leg raise on exam today.  No loss of strength or other motor symptoms.  We discussed treatment options.  She does have a neurosurgeon and I advised her to call to schedule an appointment with them ASAP.  She has had previous bad reactions to prednisone  but typically does well with steroid injections.  Will give 80 mg of Depo-Medrol  today.  She has tolerated this well in the past.  Will also start gabapentin  300 mg nightly.  She can increase to 300 mg 3 times daily over the next several days as needed.  Will defer further management to her neurosurgeon as above however she will follow-up with us  in a few days if not improving.  We discussed reasons to return to care.      Subjective:  HPI:  See assessment / plan for status of chronic conditions.  Patient is here today with several weeks of burning pain in bilateral lower extremities from feet to knees.  She has known history of neuropathy and had symptoms similar a few months ago and underwent lumbar fusion  Discussed the use of AI scribe software for clinical note transcription with the patient, who gave verbal consent to proceed.  History of Present Illness Rachel Gates is a 69 year old female with neuropathy who presents with burning, numb pain in the bilateral lower extremities.  She has been experiencing burning and numb pain in both lower extremities, extending from her feet to her knees, for several weeks. The symptoms began in her left foot and have since progressed to her right foot, with the left foot being more affected. She describes the pain as 'burns like a fire' and notes it is worse at night, often waking her from sleep.  She  has a history of neuropathy and underwent lumbar fusion surgery on April 07, 2024. Post-surgery, she initially did well, but similar symptoms recurred a few months ago. She has been trying to maintain physical activity by walking to prevent stiffness in her back, but the pain has hindered her ability to do so.  In the past, she has taken gabapentin , which did not provide relief. She is currently using a cream recommended by her brother-in-law, a diabetic, which provides some relief when applied in the morning and at night, but the effect wears off during the night.  No recent falls or specific back pain, although she experiences stiffness. She needs to stand for a moment in the morning to ensure balance before walking.  Her past medical history includes spinal stenosis and curvature at the lower back, with previous MRI findings of L4-L5 compression and nerve involvement. She underwent a challenging lumbar fusion surgery with complications including a spinal leak and excess bleeding, resulting in a prolonged recovery.         Objective:  Physical Exam: BP 130/80   Pulse 72   Temp (!) 97.4 F (36.3 C) (Temporal)   Ht 5' 7.5 (1.715 m)   Wt 231 lb 12.8 oz (105.1 kg)   LMP  (LMP Unknown)   SpO2 99%   BMI 35.77 kg/m   Gen: No acute distress, resting comfortably CV: Regular rate and rhythm with no murmurs appreciated Pulm: Normal work of breathing, clear  to auscultation bilaterally with no crackles, wheezes, or rhonchi MUSCULOSKELETAL - Back: No deformities - Legs: No deformities.  Strength 5 out of 5 throughout.  Sensation light touch intact throughout.  Straight leg raise positive bilaterally with left worse than right. Neuro: Grossly normal, moves all extremities Psych: Normal affect and thought content      Betzy Barbier M. Kennyth, MD 09/15/2024 2:44 PM  "

## 2024-09-15 NOTE — Addendum Note (Signed)
 Addended by: IDA ELORA HERO on: 09/15/2024 02:54 PM   Modules accepted: Orders

## 2025-01-30 ENCOUNTER — Encounter: Admitting: Family Medicine

## 2025-09-06 ENCOUNTER — Ambulatory Visit
